# Patient Record
Sex: Male | Born: 1970 | Race: Black or African American | Hispanic: No | Marital: Single | Smoking: Current every day smoker
Health system: Southern US, Community
[De-identification: ages and names within clinical notes are randomized; demographics above are authoritative.]

## PROBLEM LIST (undated history)

## (undated) DIAGNOSIS — F1290 Cannabis use, unspecified, uncomplicated: Secondary | ICD-10-CM

## (undated) DIAGNOSIS — F1910 Other psychoactive substance abuse, uncomplicated: Secondary | ICD-10-CM

## (undated) DIAGNOSIS — N179 Acute kidney failure, unspecified: Secondary | ICD-10-CM

## (undated) DIAGNOSIS — Z5900 Homelessness unspecified: Secondary | ICD-10-CM

## (undated) DIAGNOSIS — R110 Nausea: Secondary | ICD-10-CM

## (undated) DIAGNOSIS — K219 Gastro-esophageal reflux disease without esophagitis: Secondary | ICD-10-CM

## (undated) HISTORY — DX: Nausea: R11.0

## (undated) HISTORY — PX: NO PAST SURGERIES: SHX2092

## (undated) HISTORY — DX: Homelessness unspecified: Z59.00

## (undated) HISTORY — DX: Homelessness: Z59.0

## (undated) HISTORY — DX: Other psychoactive substance abuse, uncomplicated: F19.10

---

## 2003-08-06 ENCOUNTER — Emergency Department (HOSPITAL_COMMUNITY): Admission: EM | Admit: 2003-08-06 | Discharge: 2003-08-06 | Payer: Self-pay | Admitting: Emergency Medicine

## 2004-02-19 ENCOUNTER — Emergency Department (HOSPITAL_COMMUNITY): Admission: AD | Admit: 2004-02-19 | Discharge: 2004-02-19 | Payer: Self-pay | Admitting: Family Medicine

## 2004-10-03 ENCOUNTER — Emergency Department (HOSPITAL_COMMUNITY): Admission: EM | Admit: 2004-10-03 | Discharge: 2004-10-03 | Payer: Self-pay | Admitting: Family Medicine

## 2005-05-27 ENCOUNTER — Emergency Department (HOSPITAL_COMMUNITY): Admission: EM | Admit: 2005-05-27 | Discharge: 2005-05-27 | Payer: Self-pay | Admitting: Family Medicine

## 2006-06-02 ENCOUNTER — Emergency Department (HOSPITAL_COMMUNITY): Admission: EM | Admit: 2006-06-02 | Discharge: 2006-06-03 | Payer: Self-pay | Admitting: Emergency Medicine

## 2009-02-20 ENCOUNTER — Emergency Department (HOSPITAL_COMMUNITY): Admission: EM | Admit: 2009-02-20 | Discharge: 2009-02-20 | Payer: Self-pay | Admitting: Emergency Medicine

## 2010-08-11 ENCOUNTER — Emergency Department (HOSPITAL_COMMUNITY): Admission: EM | Admit: 2010-08-11 | Discharge: 2010-08-11 | Payer: Self-pay | Admitting: Emergency Medicine

## 2011-02-26 LAB — DIFFERENTIAL
Lymphocytes Relative: 9 % — ABNORMAL LOW (ref 12–46)
Lymphs Abs: 0.8 10*3/uL (ref 0.7–4.0)
Neutro Abs: 6.8 10*3/uL (ref 1.7–7.7)
Neutrophils Relative %: 85 % — ABNORMAL HIGH (ref 43–77)

## 2011-02-26 LAB — COMPREHENSIVE METABOLIC PANEL
CO2: 31 mEq/L (ref 19–32)
Calcium: 9.3 mg/dL (ref 8.4–10.5)
Creatinine, Ser: 1.37 mg/dL (ref 0.4–1.5)
GFR calc non Af Amer: 58 mL/min — ABNORMAL LOW (ref >60)
Glucose, Bld: 123 mg/dL — ABNORMAL HIGH (ref 70–99)

## 2011-02-26 LAB — URINALYSIS, ROUTINE W REFLEX MICROSCOPIC
Bilirubin Urine: NEGATIVE
Hgb urine dipstick: NEGATIVE
Nitrite: NEGATIVE
Protein, ur: 30 mg/dL — AB
Urobilinogen, UA: 2 mg/dL — ABNORMAL HIGH (ref 0.0–1.0)

## 2011-02-26 LAB — URINE MICROSCOPIC-ADD ON

## 2011-02-26 LAB — CBC
Hemoglobin: 16.9 g/dL (ref 13.0–17.0)
MCHC: 33.9 g/dL (ref 30.0–36.0)
MCV: 88.9 fL (ref 78.0–100.0)
RBC: 5.6 MIL/uL (ref 4.22–5.81)

## 2011-02-26 LAB — LIPASE, BLOOD: Lipase: 25 U/L (ref 11–59)

## 2011-03-04 ENCOUNTER — Emergency Department (HOSPITAL_COMMUNITY)
Admission: EM | Admit: 2011-03-04 | Discharge: 2011-03-04 | Disposition: A | Discharge status: Home / Self Care | Payer: Self-pay | Attending: Emergency Medicine | Admitting: Emergency Medicine

## 2011-03-04 DIAGNOSIS — R112 Nausea with vomiting, unspecified: Secondary | ICD-10-CM | POA: Insufficient documentation

## 2011-03-04 DIAGNOSIS — I498 Other specified cardiac arrhythmias: Secondary | ICD-10-CM | POA: Insufficient documentation

## 2011-03-04 LAB — POCT I-STAT, CHEM 8
BUN: 27 mg/dL — ABNORMAL HIGH (ref 6–23)
Chloride: 101 mEq/L (ref 96–112)
Sodium: 142 mEq/L (ref 135–145)
TCO2: 31 mmol/L (ref 0–100)

## 2011-03-07 ENCOUNTER — Emergency Department (HOSPITAL_COMMUNITY)
Admission: EM | Admit: 2011-03-07 | Discharge: 2011-03-07 | Disposition: A | Discharge status: Home / Self Care | Payer: Self-pay | Attending: Emergency Medicine | Admitting: Emergency Medicine

## 2011-03-07 DIAGNOSIS — R112 Nausea with vomiting, unspecified: Secondary | ICD-10-CM | POA: Insufficient documentation

## 2011-03-07 LAB — POCT I-STAT, CHEM 8
Calcium, Ion: 1.15 mmol/L (ref 1.12–1.32)
Chloride: 99 mEq/L (ref 96–112)
HCT: 50 % (ref 39.0–52.0)
Potassium: 3.5 mEq/L (ref 3.5–5.1)

## 2011-03-07 LAB — GLUCOSE, CAPILLARY: Glucose-Capillary: 108 mg/dL — ABNORMAL HIGH (ref 70–99)

## 2011-11-25 ENCOUNTER — Encounter: Payer: Self-pay | Admitting: *Deleted

## 2011-11-25 ENCOUNTER — Emergency Department (HOSPITAL_COMMUNITY)
Admission: EM | Admit: 2011-11-25 | Discharge: 2011-11-25 | Disposition: A | Discharge status: Home / Self Care | Payer: Self-pay | Attending: Emergency Medicine | Admitting: Emergency Medicine

## 2011-11-25 DIAGNOSIS — R112 Nausea with vomiting, unspecified: Secondary | ICD-10-CM | POA: Insufficient documentation

## 2011-11-25 LAB — COMPREHENSIVE METABOLIC PANEL
ALT: 20 U/L (ref 0–53)
AST: 14 U/L (ref 0–37)
Alkaline Phosphatase: 60 U/L (ref 39–117)
CO2: 31 mEq/L (ref 19–32)
Chloride: 92 mEq/L — ABNORMAL LOW (ref 96–112)
GFR calc non Af Amer: 58 mL/min — ABNORMAL LOW (ref >90)
Potassium: 3.4 mEq/L — ABNORMAL LOW (ref 3.5–5.1)
Sodium: 138 mEq/L (ref 135–145)
Total Bilirubin: 0.7 mg/dL (ref 0.3–1.2)

## 2011-11-25 LAB — DIFFERENTIAL
Basophils Absolute: 0 10*3/uL (ref 0.0–0.1)
Lymphocytes Relative: 25 % (ref 12–46)
Monocytes Absolute: 0.5 10*3/uL (ref 0.1–1.0)
Neutro Abs: 3.8 10*3/uL (ref 1.7–7.7)
Neutrophils Relative %: 66 % (ref 43–77)

## 2011-11-25 LAB — CBC
HCT: 49.5 % (ref 39.0–52.0)
Platelets: 190 10*3/uL (ref 150–400)
RDW: 13 % (ref 11.5–15.5)
WBC: 5.8 10*3/uL (ref 4.0–10.5)

## 2011-11-25 MED ORDER — SODIUM CHLORIDE 0.9 % IV BOLUS (SEPSIS)
1000.0000 mL | Freq: Once | INTRAVENOUS | Status: AC
  Administered 2011-11-25: 1000 mL via INTRAVENOUS

## 2011-11-25 MED ORDER — ONDANSETRON HCL 4 MG/2ML IJ SOLN
4.0000 mg | Freq: Once | INTRAMUSCULAR | Status: AC
  Administered 2011-11-25: 4 mg via INTRAVENOUS
  Filled 2011-11-25: qty 2

## 2011-11-25 MED ORDER — PROMETHAZINE HCL 25 MG PO TABS: 25.0000 mg | ORAL_TABLET | Freq: Four times a day (QID) | ORAL | Status: DC | PRN

## 2011-11-25 MED ORDER — PANTOPRAZOLE SODIUM 40 MG IV SOLR
40.0000 mg | Freq: Once | INTRAVENOUS | Status: AC
  Administered 2011-11-25: 40 mg via INTRAVENOUS
  Filled 2011-11-25: qty 40

## 2011-11-25 NOTE — ED Provider Notes (Signed)
History     CSN: [id number]  Arrival date & time 11/25/11  1506   First MD Initiated Contact with Patient 11/25/11 2135      Chief Complaint  Patient presents with  . Emesis    pt reports vomiting with PO intake, x's 7days reports constant nausea since new years day. pt reports unable to sleep and feeling weak.      HPI  History provided by the patient. Patient is a 41 year old male with no significant past medical history who presents with complaints of nausea and vomiting for the past 7 days. Patient reports symptoms began after the New Year's day. He admits to heavy drinking at that time. He denies any abdominal pains but reports having persistent nausea and vomiting with poor intake. He reports not being a keep down any solid foods and only minimal fluids. He denies any fever, chills, sweats, diarrhea. He denies any known sick contacts.   Patient has no other significant past medical history.     History reviewed. No pertinent past medical history.  History reviewed. No pertinent past surgical history.  History reviewed. No pertinent family history.  History  Substance Use Topics  . Smoking status: Current Everyday Smoker    Types: Cigarettes  . Smokeless tobacco: Not on file  . Alcohol Use: Yes      Review of Systems  Constitutional: Negative for fever and chills.  Respiratory: Negative for cough.   Gastrointestinal: Positive for nausea and vomiting. Negative for abdominal pain, diarrhea and constipation.  Genitourinary: Negative for dysuria, frequency and hematuria.  All other systems reviewed and are negative.    Allergies  Review of patient's allergies indicates no known allergies.  Home Medications  No current outpatient prescriptions on file.  BP 128/91  Pulse 61  Temp(Src) 98.2 F (36.8 C) (Oral)  Resp 20  SpO2 98%  Physical Exam  Nursing note and vitals reviewed. Constitutional: He is oriented to person, place, and time. He appears  well-developed and well-nourished. No distress.  HENT:  Head: Normocephalic and atraumatic.  Mouth/Throat: Oropharynx is clear and moist.  Cardiovascular: Normal rate and regular rhythm.   No murmur heard. Pulmonary/Chest: Effort normal and breath sounds normal.  Abdominal: Soft. He exhibits no distension. There is no tenderness. There is no rebound and no guarding.  Musculoskeletal: He exhibits no edema and no tenderness.  Neurological: He is alert and oriented to person, place, and time.  Skin: Skin is warm and dry. No erythema.  Psychiatric: He has a normal mood and affect. His behavior is normal.    ED Course  Procedures (including critical care time)   Labs Reviewed  CBC  DIFFERENTIAL  COMPREHENSIVE METABOLIC PANEL  LIPASE, BLOOD   Results for orders placed during the hospital encounter of 11/25/11  CBC      Component Value Range   WBC 5.8  4.0 - 10.5 (K/uL)   RBC 6.08 (*) 4.22 - 5.81 (MIL/uL)   Hemoglobin 18.0 (*) 13.0 - 17.0 (g/dL)   HCT 40.9  81.1 - 91.4 (%)   MCV 81.4  78.0 - 100.0 (fL)   MCH 29.6  26.0 - 34.0 (pg)   MCHC 36.4 (*) 30.0 - 36.0 (g/dL)   RDW 78.2  95.6 - 21.3 (%)   Platelets 190  150 - 400 (K/uL)  DIFFERENTIAL      Component Value Range   Neutrophils Relative 66  43 - 77 (%)   Neutro Abs 3.8  1.7 - 7.7 (K/uL)  Lymphocytes Relative 25  12 - 46 (%)   Lymphs Abs 1.4  0.7 - 4.0 (K/uL)   Monocytes Relative 9  3 - 12 (%)   Monocytes Absolute 0.5  0.1 - 1.0 (K/uL)   Eosinophils Relative 0  0 - 5 (%)   Eosinophils Absolute 0.0  0.0 - 0.7 (K/uL)   Basophils Relative 0  0 - 1 (%)   Basophils Absolute 0.0  0.0 - 0.1 (K/uL)  COMPREHENSIVE METABOLIC PANEL      Component Value Range   Sodium 138  135 - 145 (mEq/L)   Potassium 3.4 (*) 3.5 - 5.1 (mEq/L)   Chloride 92 (*) 96 - 112 (mEq/L)   CO2 31  19 - 32 (mEq/L)   Glucose, Bld 127 (*) 70 - 99 (mg/dL)   BUN 33 (*) 6 - 23 (mg/dL)   Creatinine, Ser 1.61 (*) 0.50 - 1.35 (mg/dL)   Calcium 9.9  8.4 - 09.6  (mg/dL)   Total Protein 8.3  6.0 - 8.3 (g/dL)   Albumin 4.8  3.5 - 5.2 (g/dL)   AST 14  0 - 37 (U/L)   ALT 20  0 - 53 (U/L)   Alkaline Phosphatase 60  39 - 117 (U/L)   Total Bilirubin 0.7  0.3 - 1.2 (mg/dL)   GFR calc non Af Amer 58 (*) >90 (mL/min)   GFR calc Af Amer 67 (*) >90 (mL/min)  LIPASE, BLOOD      Component Value Range   Lipase 50  11 - 59 (U/L)      No results found.   1. Nausea & vomiting       MDM  9:50 PM patient seen and evaluated. Patient in no acute distress.  Patient reports feeling much better after IV fluids and nausea medications. He is tolerating by mouth fluids. Labs unremarkable aside from some signs of dehydration. At this time we'll discharge patient home.      Angus Seller, [place] 11/25/11 [phone]

## 2011-11-25 NOTE — ED Notes (Signed)
Bed:WA19<BR> Expected date:<BR> Expected time:<BR> Means of arrival:<BR> Comments:<BR>

## 2011-11-25 NOTE — ED Notes (Signed)
Per EMS has had 7 days of nausea but with vomitting or diarrhea. Pt has had this for several years and has not been dx with anything. Vital Normal 128/88 pulse 76 and CBG 139.

## 2011-11-26 NOTE — ED Provider Notes (Signed)
Medical screening examination/treatment/procedure(s) were performed by non-physician practitioner and as supervising physician I was immediately available for consultation/collaboration.  Day Deery, MD 11/26/11 1245 

## 2012-02-13 ENCOUNTER — Encounter (HOSPITAL_COMMUNITY): Payer: Self-pay | Admitting: *Deleted

## 2012-02-13 ENCOUNTER — Emergency Department (HOSPITAL_COMMUNITY)
Admission: EM | Admit: 2012-02-13 | Discharge: 2012-02-13 | Disposition: A | Discharge status: Home / Self Care | Payer: Self-pay | Attending: Emergency Medicine | Admitting: Emergency Medicine

## 2012-02-13 DIAGNOSIS — R112 Nausea with vomiting, unspecified: Secondary | ICD-10-CM | POA: Insufficient documentation

## 2012-02-13 DIAGNOSIS — R5381 Other malaise: Secondary | ICD-10-CM | POA: Insufficient documentation

## 2012-02-13 DIAGNOSIS — R42 Dizziness and giddiness: Secondary | ICD-10-CM | POA: Insufficient documentation

## 2012-02-13 DIAGNOSIS — R5383 Other fatigue: Secondary | ICD-10-CM | POA: Insufficient documentation

## 2012-02-13 DIAGNOSIS — F17200 Nicotine dependence, unspecified, uncomplicated: Secondary | ICD-10-CM | POA: Insufficient documentation

## 2012-02-13 LAB — DIFFERENTIAL
Basophils Absolute: 0 10*3/uL (ref 0.0–0.1)
Eosinophils Absolute: 0 10*3/uL (ref 0.0–0.7)
Eosinophils Relative: 0 % (ref 0–5)
Lymphocytes Relative: 13 % (ref 12–46)
Neutrophils Relative %: 73 % (ref 43–77)

## 2012-02-13 LAB — CBC
MCV: 82.6 fL (ref 78.0–100.0)
Platelets: 200 10*3/uL (ref 150–400)
RDW: 13.6 % (ref 11.5–15.5)
WBC: 6.3 10*3/uL (ref 4.0–10.5)

## 2012-02-13 LAB — BASIC METABOLIC PANEL
Calcium: 9.6 mg/dL (ref 8.4–10.5)
GFR calc non Af Amer: >90 mL/min (ref >90)
Potassium: 3.7 mEq/L (ref 3.5–5.1)
Sodium: 139 mEq/L (ref 135–145)

## 2012-02-13 MED ORDER — METOCLOPRAMIDE HCL 5 MG/ML IJ SOLN
10.0000 mg | Freq: Once | INTRAMUSCULAR | Status: AC
  Administered 2012-02-13: 10 mg via INTRAVENOUS
  Filled 2012-02-13: qty 2

## 2012-02-13 MED ORDER — SODIUM CHLORIDE 0.9 % IV BOLUS (SEPSIS)
1000.0000 mL | Freq: Once | INTRAVENOUS | Status: AC
  Administered 2012-02-13: 1000 mL via INTRAVENOUS

## 2012-02-13 MED ORDER — ONDANSETRON HCL 4 MG/2ML IJ SOLN
4.0000 mg | Freq: Once | INTRAMUSCULAR | Status: AC
  Administered 2012-02-13: 4 mg via INTRAVENOUS
  Filled 2012-02-13: qty 2

## 2012-02-13 MED ORDER — PROMETHAZINE HCL 25 MG PO TABS: 25.0000 mg | ORAL_TABLET | Freq: Four times a day (QID) | ORAL | Status: DC | PRN

## 2012-02-13 NOTE — ED Provider Notes (Signed)
History     CSN: [id number]  Arrival date & time 02/13/12  7829   First MD Initiated Contact with Patient 02/13/12 1047      Chief Complaint  Patient presents with  . Fatigue    (Consider location/radiation/quality/duration/timing/severity/associated sxs/prior treatment) HPI  Patient presents to emergency department complaining of a one to two-week history of gradual onset nausea with vomiting and generalized fatigue. Patient states he also feels mildly dizzy. Patient states that over last 1-2 years he's had multiple episodes of similar symptoms. Patient denies any associated abdominal pain with nausea but states "every time I eat I vomit." Patient states he is able to hold down small amounts of fluid and Gatorade without vomiting but is unable to hold down food. Patient states that "no one has ever really figure out why this happens to me." Patient denies any fevers, chills, chest pain, shortness breath, abdominal pain, diarrhea, dysuria, hematuria, or blood in his stool. Patient states he has no known medical problems and takes no medicine on regular basis. Patient is taking no medication prior to arrival the symptoms. Patient denies aggravating or alleviating factors. Symptoms were gradual onset, persistent, and unchanging.  History reviewed. No pertinent past medical history.  History reviewed. No pertinent past surgical history.  History reviewed. No pertinent family history.  History  Substance Use Topics  . Smoking status: Current Everyday Smoker    Types: Cigarettes  . Smokeless tobacco: Not on file  . Alcohol Use: Yes     occ      Review of Systems  All other systems reviewed and are negative.    Allergies  Review of patient's allergies indicates no known allergies.  Home Medications  No current outpatient prescriptions on file.  BP 105/50  Pulse 73  Temp(Src) 98 F (36.7 C) (Oral)  Resp 20  SpO2 98%  Physical Exam  Nursing note and vitals  reviewed. Constitutional: He is oriented to person, place, and time. He appears well-developed and well-nourished. No distress.  HENT:  Head: Normocephalic and atraumatic.       Mucus membranes pink and moist  Eyes: Conjunctivae are normal.  Neck: Normal range of motion. Neck supple.  Cardiovascular: Normal rate, regular rhythm, normal heart sounds and intact distal pulses.  Exam reveals no gallop and no friction rub.   No murmur heard. Pulmonary/Chest: Effort normal and breath sounds normal. No respiratory distress. He has no wheezes. He has no rales. He exhibits no tenderness.  Abdominal: Soft. Bowel sounds are normal. He exhibits no distension and no mass. There is no tenderness. There is no rebound and no guarding.  Musculoskeletal: Normal range of motion. He exhibits no edema and no tenderness.  Neurological: He is alert and oriented to person, place, and time. No cranial nerve deficit. Coordination normal.  Skin: Skin is warm and dry. No rash noted. He is not diaphoretic. No erythema.  Psychiatric: He has a normal mood and affect.    ED Course  Procedures (including critical care time)  IV fluids and zofran  12:25 PM Nausea resolved after medication and fluids. Will PO challenge  Labs Reviewed  CBC - Abnormal; Notable for the following:    Hemoglobin 17.3 (*)    MCHC 36.1 (*)    All other components within normal limits  DIFFERENTIAL - Abnormal; Notable for the following:    Monocytes Relative 14 (*)    All other components within normal limits  BASIC METABOLIC PANEL - Abnormal; Notable for the following:  Glucose, Bld 111 (*)    BUN 24 (*)    All other components within normal limits   No results found.   1. Nausea and vomiting       MDM  Patient has no known medical problems with recurrent episodes of nausea and vomiting that are not associated with abdominal pain. VSS and afebrile. Nonacute abdomen. No acute findings on his labs. Patient not tolerating fluids  well. No signs or symptoms of dehydration although 1 L of IV fluids given in ER. Secondly for the patient about the need for establishment with primary care provider for further evaluation and management of recurrent nausea and vomiting. He voices understanding and is agreeable to plan. I gave him precautions and reasons to return to ER for changing or worsening symptoms. Patient voices understanding.        Jenness Corner, [place] 02/13/12 1255

## 2012-02-13 NOTE — ED Notes (Signed)
Pt reports for a week he hasn't been able to eat anything, states every time he eats it comes back up. Pt noted to be drinking gatorade in triage. Pt reports dizziness and fatigue. Denies abd pain.

## 2012-02-13 NOTE — ED Notes (Signed)
Pt. States that he hasn't been eating for about a week.  The smell of food makes him nauseous and causes him to throw up.  He can occasionally keep water down but everything else comes back up.  He is feeling weak and has no energy.  Feels dizzy but feels that is d/t him not eating.

## 2012-02-13 NOTE — Discharge Instructions (Signed)
Take phenergan as needed for nausea and vomiting. Establishment with a Primary Care provider is Very important for general health care concerns, minor illness and minor injury and for further evaluation and management of recurrent nausea and vomiting but return to ER for changing or worsening of symptoms.   Nausea and Vomiting Nausea is a sick feeling that often comes before throwing up (vomiting). Vomiting is a reflex where stomach contents come out of your mouth. Vomiting can cause severe loss of body fluids (dehydration). Children and elderly adults can become dehydrated quickly, especially if they also have diarrhea. Nausea and vomiting are symptoms of a condition or disease. It is important to find the cause of your symptoms. CAUSES   Direct irritation of the stomach lining. This irritation can result from increased acid production (gastroesophageal reflux disease), infection, food poisoning, taking certain medicines (such as nonsteroidal anti-inflammatory drugs), alcohol use, or tobacco use.   Signals from the brain.These signals could be caused by a headache, heat exposure, an inner ear disturbance, increased pressure in the brain from injury, infection, a tumor, or a concussion, pain, emotional stimulus, or metabolic problems.   An obstruction in the gastrointestinal tract (bowel obstruction).   Illnesses such as diabetes, hepatitis, gallbladder problems, appendicitis, kidney problems, cancer, sepsis, atypical symptoms of a heart attack, or eating disorders.   Medical treatments such as chemotherapy and radiation.   Receiving medicine that makes you sleep (general anesthetic) during surgery.  DIAGNOSIS Your caregiver may ask for tests to be done if the problems do not improve after a few days. Tests may also be done if symptoms are severe or if the reason for the nausea and vomiting is not clear. Tests may include:  Urine tests.   Blood tests.   Stool tests.   Cultures (to look  for evidence of infection).   X-rays or other imaging studies.  Test results can help your caregiver make decisions about treatment or the need for additional tests. TREATMENT You need to stay well hydrated. Drink frequently but in small amounts.You may wish to drink water, sports drinks, clear broth, or eat frozen ice pops or gelatin dessert to help stay hydrated.When you eat, eating slowly may help prevent nausea.There are also some antinausea medicines that may help prevent nausea. HOME CARE INSTRUCTIONS   Take all medicine as directed by your caregiver.   If you do not have an appetite, do not force yourself to eat. However, you must continue to drink fluids.   If you have an appetite, eat a normal diet unless your caregiver tells you differently.   Eat a variety of complex carbohydrates (rice, wheat, potatoes, bread), lean meats, yogurt, fruits, and vegetables.   Avoid high-fat foods because they are more difficult to digest.   Drink enough water and fluids to keep your urine clear or pale yellow.   If you are dehydrated, ask your caregiver for specific rehydration instructions. Signs of dehydration may include:   Severe thirst.   Dry lips and mouth.   Dizziness.   Dark urine.   Decreasing urine frequency and amount.   Confusion.   Rapid breathing or pulse.  SEEK IMMEDIATE MEDICAL CARE IF:   You have blood or brown flecks (like coffee grounds) in your vomit.   You have black or bloody stools.   You have a severe headache or stiff neck.   You are confused.   You have severe abdominal pain.   You have chest pain or trouble breathing.   You  do not urinate at least once every 8 hours.   You develop cold or clammy skin.   You continue to vomit for longer than 24 to 48 hours.   You have a fever.  MAKE SURE YOU:   Understand these instructions.   Will watch your condition.   Will get help right away if you are not doing well or get worse.  Document  Released: 11/03/2005 Document Revised: 10/23/2011 Document Reviewed: 04/02/2011 [facility] Patient Information 2012 [place], [place].

## 2012-02-14 NOTE — ED Provider Notes (Signed)
Medical screening examination/treatment/procedure(s) were performed by non-physician practitioner and as supervising physician I was immediately available for consultation/collaboration.   Forbes Cellar, MD 02/14/12 [phone]

## 2013-02-28 ENCOUNTER — Emergency Department (HOSPITAL_COMMUNITY): Payer: Self-pay

## 2013-02-28 ENCOUNTER — Emergency Department (HOSPITAL_COMMUNITY)
Admission: EM | Admit: 2013-02-28 | Discharge: 2013-02-28 | Disposition: A | Discharge status: Home / Self Care | Payer: Self-pay | Attending: Emergency Medicine | Admitting: Emergency Medicine

## 2013-02-28 ENCOUNTER — Encounter (HOSPITAL_COMMUNITY): Payer: Self-pay | Admitting: *Deleted

## 2013-02-28 DIAGNOSIS — R1110 Vomiting, unspecified: Secondary | ICD-10-CM

## 2013-02-28 DIAGNOSIS — F17200 Nicotine dependence, unspecified, uncomplicated: Secondary | ICD-10-CM | POA: Insufficient documentation

## 2013-02-28 DIAGNOSIS — R112 Nausea with vomiting, unspecified: Secondary | ICD-10-CM | POA: Insufficient documentation

## 2013-02-28 LAB — COMPREHENSIVE METABOLIC PANEL
ALT: 13 U/L (ref 0–53)
Alkaline Phosphatase: 50 U/L (ref 39–117)
CO2: 26 mEq/L (ref 19–32)
Chloride: 101 mEq/L (ref 96–112)
GFR calc Af Amer: >90 mL/min (ref >90)
GFR calc non Af Amer: 81 mL/min — ABNORMAL LOW (ref >90)
Glucose, Bld: 103 mg/dL — ABNORMAL HIGH (ref 70–99)
Potassium: 3.7 mEq/L (ref 3.5–5.1)
Sodium: 137 mEq/L (ref 135–145)
Total Bilirubin: 0.5 mg/dL (ref 0.3–1.2)
Total Protein: 6.9 g/dL (ref 6.0–8.3)

## 2013-02-28 LAB — CBC WITH DIFFERENTIAL/PLATELET
Hemoglobin: 16.3 g/dL (ref 13.0–17.0)
Lymphocytes Relative: 36 % (ref 12–46)
Lymphs Abs: 1.7 10*3/uL (ref 0.7–4.0)
Neutro Abs: 2.6 10*3/uL (ref 1.7–7.7)
Neutrophils Relative %: 55 % (ref 43–77)
Platelets: 167 10*3/uL (ref 150–400)
RBC: 5.55 MIL/uL (ref 4.22–5.81)
WBC: 4.8 10*3/uL (ref 4.0–10.5)

## 2013-02-28 MED ORDER — PROMETHAZINE HCL 25 MG PO TABS: 25.0000 mg | ORAL_TABLET | Freq: Four times a day (QID) | ORAL | Status: DC | PRN

## 2013-02-28 MED ORDER — PROMETHAZINE HCL 25 MG/ML IJ SOLN
25.0000 mg | Freq: Once | INTRAMUSCULAR | Status: AC
  Administered 2013-02-28: 25 mg via INTRAVENOUS
  Filled 2013-02-28: qty 1

## 2013-02-28 MED ORDER — SODIUM CHLORIDE 0.9 % IV BOLUS (SEPSIS)
1000.0000 mL | Freq: Once | INTRAVENOUS | Status: AC
  Administered 2013-02-28: 1000 mL via INTRAVENOUS

## 2013-02-28 MED ORDER — METOCLOPRAMIDE HCL 5 MG/ML IJ SOLN: 10.0000 mg | Freq: Once | INTRAMUSCULAR | Status: DC

## 2013-02-28 MED ORDER — ONDANSETRON HCL 4 MG/2ML IJ SOLN
4.0000 mg | Freq: Once | INTRAMUSCULAR | Status: AC
  Administered 2013-02-28: 4 mg via INTRAVENOUS
  Filled 2013-02-28: qty 2

## 2013-02-28 NOTE — ED Notes (Signed)
C/o n/v x 3 weeks. Denies diarrhea or abd pain

## 2013-02-28 NOTE — ED Notes (Signed)
C/o intermittent n/v x 2 years. Current episode started 3 weeks ago, reports unable to keep anything down by mouth. Denies diarrhea or abd pain. Has been seen in ED for same

## 2013-02-28 NOTE — ED Notes (Signed)
Patient transported to X-ray 

## 2013-02-28 NOTE — ED Provider Notes (Signed)
History     CSN: [id number]  Arrival date & time 02/28/13  0740   First MD Initiated Contact with Patient 02/28/13 0750      Chief Complaint  Patient presents with  . Nausea  . Emesis    (Consider location/radiation/quality/duration/timing/severity/associated sxs/prior treatment) The history is provided by the patient.  Henry Kramer is a 42 y.o. male here with nausea, vomiting. Intermittent nausea and vomiting for the last 3 weeks. He said he also has been dry heaving. Denies abdominal pain or chest pain. He has been here a year ago for similar symptoms and had taken Phenergan with some relief. Denies any fevers or chills. He doesn't have any insurance so doesn't have a pmd. No weight loss.    History reviewed. No pertinent past medical history.  History reviewed. No pertinent past surgical history.  No family history on file.  History  Substance Use Topics  . Smoking status: Current Every Day Smoker    Types: Cigarettes  . Smokeless tobacco: Not on file  . Alcohol Use: Yes     Comment: occ      Review of Systems  Gastrointestinal: Positive for nausea and vomiting.  All other systems reviewed and are negative.    Allergies  Review of patient's allergies indicates no known allergies.  Home Medications   No current outpatient prescriptions on file.  BP 121/75  Pulse 58  Temp(Src) 98 F (36.7 C) (Oral)  Resp 16  Ht 6\' 3"  (1.905 m)  Wt 175 lb (79.379 kg)  BMI 21.87 kg/m2  SpO2 97%  Physical Exam  Nursing note and vitals reviewed. Constitutional: He is oriented to person, place, and time. He appears well-developed and well-nourished.  HENT:  Head: Normocephalic.  MM slightly dry   Eyes: Conjunctivae are normal. Pupils are equal, round, and reactive to light.  Neck: Normal range of motion. Neck supple.  Cardiovascular: Normal rate, regular rhythm and normal heart sounds.   Pulmonary/Chest: Effort normal and breath sounds normal. No respiratory  distress. He has no wheezes. He has no rales.  Abdominal: Soft. Bowel sounds are normal. He exhibits no distension. There is no tenderness. There is no rebound.  Musculoskeletal: Normal range of motion.  Neurological: He is alert and oriented to person, place, and time. No cranial nerve deficit. Coordination normal.  Skin: Skin is warm and dry.  Psychiatric: He has a normal mood and affect. His behavior is normal. Judgment and thought content normal.    ED Course  Procedures (including critical care time)  Labs Reviewed  CBC WITH DIFFERENTIAL - Abnormal; Notable for the following:    MCHC 36.3 (*)    All other components within normal limits  COMPREHENSIVE METABOLIC PANEL - Abnormal; Notable for the following:    Glucose, Bld 103 (*)    GFR calc non Af Amer 81 (*)    All other components within normal limits  LIPASE, BLOOD   Dg Abd Acute W/chest  02/28/2013  *RADIOLOGY REPORT*  Clinical Data: Nausea and vomiting for 3 weeks  ACUTE ABDOMEN SERIES (ABDOMEN 2 VIEW & CHEST 1 VIEW)  Comparison: 02/20/2009  Findings: Normal heart size, mediastinal contours, and pulmonary vascularity. Chronic peribronchial thickening. Lungs clear. No pleural effusion or pneumothorax.  Pelvic phleboliths. Normal bowel gas pattern. No bowel dilatation, bowel wall thickening, or free intraperitoneal air. Bones unremarkable. No urinary tract calcification.  IMPRESSION: Chronic bronchitic changes. No acute abnormalities.   Original Report Authenticated By: Ulyses Southward, M.D.  No diagnosis found.    MDM  Henry Kramer is a 42 y.o. male here with nausea/vomiting. Will get basic blood work. Will give IVF and zofran and reassess.   11:34 AM Felt better after zofran and phenergan and fluids. Will d/c home on phenergan. Gave him a list of pmd to f/u with.         Richardean Canal, MD 02/28/13 [phone]

## 2013-11-21 ENCOUNTER — Encounter (HOSPITAL_COMMUNITY): Payer: Self-pay | Admitting: Emergency Medicine

## 2013-11-21 ENCOUNTER — Emergency Department (HOSPITAL_COMMUNITY)
Admission: EM | Admit: 2013-11-21 | Discharge: 2013-11-21 | Disposition: A | Discharge status: Home / Self Care | Payer: No Typology Code available for payment source | Attending: Emergency Medicine | Admitting: Emergency Medicine

## 2013-11-21 DIAGNOSIS — R42 Dizziness and giddiness: Secondary | ICD-10-CM | POA: Insufficient documentation

## 2013-11-21 DIAGNOSIS — R109 Unspecified abdominal pain: Secondary | ICD-10-CM | POA: Insufficient documentation

## 2013-11-21 DIAGNOSIS — R112 Nausea with vomiting, unspecified: Secondary | ICD-10-CM | POA: Insufficient documentation

## 2013-11-21 DIAGNOSIS — F17200 Nicotine dependence, unspecified, uncomplicated: Secondary | ICD-10-CM | POA: Insufficient documentation

## 2013-11-21 DIAGNOSIS — E860 Dehydration: Secondary | ICD-10-CM | POA: Insufficient documentation

## 2013-11-21 DIAGNOSIS — R1110 Vomiting, unspecified: Secondary | ICD-10-CM

## 2013-11-21 LAB — COMPREHENSIVE METABOLIC PANEL
ALT: 13 U/L (ref 0–53)
AST: 13 U/L (ref 0–37)
Albumin: 4.2 g/dL (ref 3.5–5.2)
Alkaline Phosphatase: 75 U/L (ref 39–117)
BILIRUBIN TOTAL: 1 mg/dL (ref 0.3–1.2)
BUN: 26 mg/dL — ABNORMAL HIGH (ref 6–23)
CALCIUM: 9.2 mg/dL (ref 8.4–10.5)
CHLORIDE: 95 meq/L — AB (ref 96–112)
CO2: 28 meq/L (ref 19–32)
Creatinine, Ser: 1.62 mg/dL — ABNORMAL HIGH (ref 0.50–1.35)
GFR, EST AFRICAN AMERICAN: 59 mL/min — AB (ref >90)
GFR, EST NON AFRICAN AMERICAN: 50 mL/min — AB (ref >90)
GLUCOSE: 126 mg/dL — AB (ref 70–99)
Potassium: 4.1 mEq/L (ref 3.7–5.3)
SODIUM: 138 meq/L (ref 137–147)
Total Protein: 8.5 g/dL — ABNORMAL HIGH (ref 6.0–8.3)

## 2013-11-21 LAB — CBC WITH DIFFERENTIAL/PLATELET
BASOS ABS: 0 10*3/uL (ref 0.0–0.1)
Basophils Relative: 0 % (ref 0–1)
EOS PCT: 0 % (ref 0–5)
Eosinophils Absolute: 0 10*3/uL (ref 0.0–0.7)
HEMATOCRIT: 51 % (ref 39.0–52.0)
Hemoglobin: 18.8 g/dL — ABNORMAL HIGH (ref 13.0–17.0)
LYMPHS ABS: 2.2 10*3/uL (ref 0.7–4.0)
LYMPHS PCT: 20 % (ref 12–46)
MCH: 30.6 pg (ref 26.0–34.0)
MCHC: 36.9 g/dL — ABNORMAL HIGH (ref 30.0–36.0)
MCV: 82.9 fL (ref 78.0–100.0)
MONO ABS: 1.5 10*3/uL — AB (ref 0.1–1.0)
Monocytes Relative: 14 % — ABNORMAL HIGH (ref 3–12)
NEUTROS ABS: 7.2 10*3/uL (ref 1.7–7.7)
Neutrophils Relative %: 66 % (ref 43–77)
PLATELETS: 210 10*3/uL (ref 150–400)
RBC: 6.15 MIL/uL — AB (ref 4.22–5.81)
RDW: 13.4 % (ref 11.5–15.5)
WBC: 10.9 10*3/uL — AB (ref 4.0–10.5)

## 2013-11-21 LAB — URINALYSIS, ROUTINE W REFLEX MICROSCOPIC
BILIRUBIN URINE: NEGATIVE
GLUCOSE, UA: NEGATIVE mg/dL
HGB URINE DIPSTICK: NEGATIVE
KETONES UR: 15 mg/dL — AB
Leukocytes, UA: NEGATIVE
Nitrite: NEGATIVE
PROTEIN: NEGATIVE mg/dL
Specific Gravity, Urine: 1.016 (ref 1.005–1.030)
UROBILINOGEN UA: 1 mg/dL (ref 0.0–1.0)
pH: 5.5 (ref 5.0–8.0)

## 2013-11-21 LAB — LIPASE, BLOOD: Lipase: 29 U/L (ref 11–59)

## 2013-11-21 MED ORDER — ONDANSETRON HCL 4 MG/2ML IJ SOLN
4.0000 mg | Freq: Once | INTRAMUSCULAR | Status: AC
  Administered 2013-11-21: 4 mg via INTRAVENOUS
  Filled 2013-11-21: qty 2

## 2013-11-21 MED ORDER — SODIUM CHLORIDE 0.9 % IV BOLUS (SEPSIS)
1000.0000 mL | Freq: Once | INTRAVENOUS | Status: AC
  Administered 2013-11-21: 1000 mL via INTRAVENOUS

## 2013-11-21 MED ORDER — LANSOPRAZOLE 30 MG PO CPDR: 30.0000 mg | DELAYED_RELEASE_CAPSULE | Freq: Every day | ORAL | Status: DC

## 2013-11-21 MED ORDER — ONDANSETRON 4 MG PO TBDP: ORAL_TABLET | ORAL | Status: DC

## 2013-11-21 NOTE — ED Notes (Signed)
Dr Lita Mains requested to have him sit up and move around and try to eat something.  [place] sandwich given, Sprite.

## 2013-11-21 NOTE — ED Notes (Signed)
Remains in triage waiting area.  Updated on wait for treatment room.

## 2013-11-21 NOTE — Discharge Instructions (Signed)
Make appointment to followup with gastroenterology. Take medications as prescribed. Return immediately for persistent vomiting, blood in vomit or stool, chest pain or for any concerns. Avoid alcohol and NSAID-type (ibuprofen, naproxen, aspirin, etc.) medication. Avoid marijuana use.   Nausea and Vomiting Nausea is a sick feeling that often comes before throwing up (vomiting). Vomiting is a reflex where stomach contents come out of your mouth. Vomiting can cause severe loss of body fluids (dehydration). Children and elderly adults can become dehydrated quickly, especially if they also have diarrhea. Nausea and vomiting are symptoms of a condition or disease. It is important to find the cause of your symptoms. CAUSES   Direct irritation of the stomach lining. This irritation can result from increased acid production (gastroesophageal reflux disease), infection, food poisoning, taking certain medicines (such as nonsteroidal anti-inflammatory drugs), alcohol use, or tobacco use.  Signals from the brain.These signals could be caused by a headache, heat exposure, an inner ear disturbance, increased pressure in the brain from injury, infection, a tumor, or a concussion, pain, emotional stimulus, or metabolic problems.  An obstruction in the gastrointestinal tract (bowel obstruction).  Illnesses such as diabetes, hepatitis, gallbladder problems, appendicitis, kidney problems, cancer, sepsis, atypical symptoms of a heart attack, or eating disorders.  Medical treatments such as chemotherapy and radiation.  Receiving medicine that makes you sleep (general anesthetic) during surgery. DIAGNOSIS Your caregiver may ask for tests to be done if the problems do not improve after a few days. Tests may also be done if symptoms are severe or if the reason for the nausea and vomiting is not clear. Tests may include:  Urine tests.  Blood tests.  Stool tests.  Cultures (to look for evidence of  infection).  X-rays or other imaging studies. Test results can help your caregiver make decisions about treatment or the need for additional tests. TREATMENT You need to stay well hydrated. Drink frequently but in small amounts.You may wish to drink water, sports drinks, clear broth, or eat frozen ice pops or gelatin dessert to help stay hydrated.When you eat, eating slowly may help prevent nausea.There are also some antinausea medicines that may help prevent nausea. HOME CARE INSTRUCTIONS   Take all medicine as directed by your caregiver.  If you do not have an appetite, do not force yourself to eat. However, you must continue to drink fluids.  If you have an appetite, eat a normal diet unless your caregiver tells you differently.  Eat a variety of complex carbohydrates (rice, wheat, potatoes, bread), lean meats, yogurt, fruits, and vegetables.  Avoid high-fat foods because they are more difficult to digest.  Drink enough water and fluids to keep your urine clear or pale yellow.  If you are dehydrated, ask your caregiver for specific rehydration instructions. Signs of dehydration may include:  Severe thirst.  Dry lips and mouth.  Dizziness.  Dark urine.  Decreasing urine frequency and amount.  Confusion.  Rapid breathing or pulse. SEEK IMMEDIATE MEDICAL CARE IF:   You have blood or brown flecks (like coffee grounds) in your vomit.  You have black or bloody stools.  You have a severe headache or stiff neck.  You are confused.  You have severe abdominal pain.  You have chest pain or trouble breathing.  You do not urinate at least once every 8 hours.  You develop cold or clammy skin.  You continue to vomit for longer than 24 to 48 hours.  You have a fever. MAKE SURE YOU:   Understand these instructions.  Will watch your condition.  Will get help right away if you are not doing well or get worse. Document Released: 11/03/2005 Document Revised: 01/26/2012  Document Reviewed: 04/02/2011 [facility] Patient Information 2014 [place], [place].  Dehydration, Adult Dehydration is when you lose more fluids from the body than you take in. Vital organs like the kidneys, brain, and heart cannot function without a proper amount of fluids and salt. Any loss of fluids from the body can cause dehydration.  CAUSES   Vomiting.  Diarrhea.  Excessive sweating.  Excessive urine output.  Fever. SYMPTOMS  Mild dehydration  Thirst.  Dry lips.  Slightly dry mouth. Moderate dehydration  Very dry mouth.  Sunken eyes.  Skin does not bounce back quickly when lightly pinched and released.  Dark urine and decreased urine production.  Decreased tear production.  Headache. Severe dehydration  Very dry mouth.  Extreme thirst.  Rapid, weak pulse (more than 100 beats per minute at rest).  Cold hands and feet.  Not able to sweat in spite of heat and temperature.  Rapid breathing.  Blue lips.  Confusion and lethargy.  Difficulty being awakened.  Minimal urine production.  No tears. DIAGNOSIS  Your caregiver will diagnose dehydration based on your symptoms and your exam. Blood and urine tests will help confirm the diagnosis. The diagnostic evaluation should also identify the cause of dehydration. TREATMENT  Treatment of mild or moderate dehydration can often be done at home by increasing the amount of fluids that you drink. It is best to drink small amounts of fluid more often. Drinking too much at one time can make vomiting worse. Refer to the home care instructions below. Severe dehydration needs to be treated at the hospital where you will probably be given intravenous (IV) fluids that contain water and electrolytes. HOME CARE INSTRUCTIONS   Ask your caregiver about specific rehydration instructions.  Drink enough fluids to keep your urine clear or pale yellow.  Drink small amounts frequently if you have nausea and vomiting.  Eat as  you normally do.  Avoid:  Foods or drinks high in sugar.  Carbonated drinks.  Juice.  Extremely hot or cold fluids.  Drinks with caffeine.  Fatty, greasy foods.  Alcohol.  Tobacco.  Overeating.  Gelatin desserts.  Wash your hands well to avoid spreading bacteria and viruses.  Only take over-the-counter or prescription medicines for pain, discomfort, or fever as directed by your caregiver.  Ask your caregiver if you should continue all prescribed and over-the-counter medicines.  Keep all follow-up appointments with your caregiver. SEEK MEDICAL CARE IF:  You have abdominal pain and it increases or stays in one area (localizes).  You have a rash, stiff neck, or severe headache.  You are irritable, sleepy, or difficult to awaken.  You are weak, dizzy, or extremely thirsty. SEEK IMMEDIATE MEDICAL CARE IF:   You are unable to keep fluids down or you get worse despite treatment.  You have frequent episodes of vomiting or diarrhea.  You have blood or green matter (bile) in your vomit.  You have blood in your stool or your stool looks black and tarry.  You have not urinated in 6 to 8 hours, or you have only urinated a small amount of very dark urine.  You have a fever.  You faint. MAKE SURE YOU:   Understand these instructions.  Will watch your condition.  Will get help right away if you are not doing well or get worse. Document Released: 11/03/2005 Document Revised: 01/26/2012 Document  Reviewed: 06/23/2011 [employer] Patient Information 2014 [place], [place]. Gastritis, Adult Gastritis is soreness and swelling (inflammation) of the lining of the stomach. Gastritis can develop as a sudden onset (acute) or long-term (chronic) condition. If gastritis is not treated, it can lead to stomach bleeding and ulcers. CAUSES  Gastritis occurs when the stomach lining is weak or damaged. Digestive juices from the stomach then inflame the weakened stomach lining. The stomach  lining may be weak or damaged due to viral or bacterial infections. One common bacterial infection is the Helicobacter pylori infection. Gastritis can also result from excessive alcohol consumption, taking certain medicines, or having too much acid in the stomach.  SYMPTOMS  In some cases, there are no symptoms. When symptoms are present, they may include:  Pain or a burning sensation in the upper abdomen.  Nausea.  Vomiting.  An uncomfortable feeling of fullness after eating. DIAGNOSIS  Your caregiver may suspect you have gastritis based on your symptoms and a physical exam. To determine the cause of your gastritis, your caregiver may perform the following:  Blood or stool tests to check for the H pylori bacterium.  Gastroscopy. A thin, flexible tube (endoscope) is passed down the esophagus and into the stomach. The endoscope has a light and camera on the end. Your caregiver uses the endoscope to view the inside of the stomach.  Taking a tissue sample (biopsy) from the stomach to examine under a microscope. TREATMENT  Depending on the cause of your gastritis, medicines may be prescribed. If you have a bacterial infection, such as an H pylori infection, antibiotics may be given. If your gastritis is caused by too much acid in the stomach, H2 blockers or antacids may be given. Your caregiver may recommend that you stop taking aspirin, ibuprofen, or other nonsteroidal anti-inflammatory drugs (NSAIDs). HOME CARE INSTRUCTIONS  Only take over-the-counter or prescription medicines as directed by your caregiver.  If you were given antibiotic medicines, take them as directed. Finish them even if you start to feel better.  Drink enough fluids to keep your urine clear or pale yellow.  Avoid foods and drinks that make your symptoms worse, such as:  Caffeine or alcoholic drinks.  Chocolate.  Peppermint or mint flavorings.  Garlic and onions.  Spicy foods.  Citrus fruits, such as oranges,  lemons, or limes.  Tomato-based foods such as sauce, chili, salsa, and pizza.  Fried and fatty foods.  Eat small, frequent meals instead of large meals. SEEK IMMEDIATE MEDICAL CARE IF:   You have black or dark red stools.  You vomit blood or material that looks like coffee grounds.  You are unable to keep fluids down.  Your abdominal pain gets worse.  You have a fever.  You do not feel better after 1 week.  You have any other questions or concerns. MAKE SURE YOU:  Understand these instructions.  Will watch your condition.  Will get help right away if you are not doing well or get worse. Document Released: 10/28/2001 Document Revised: 05/04/2012 Document Reviewed: 12/17/2011 [facility] Patient Information 2014 [place].

## 2013-11-21 NOTE — ED Notes (Signed)
Patient stated that he has not been able to eat for about 1 week.  Has been able to drink but not that much.  States he gets dizzy sometimes when he gets up.

## 2013-11-21 NOTE — ED Notes (Signed)
Pt had no problems with eating and drinking. Stated that he felt "so much better".

## 2013-11-21 NOTE — ED Notes (Signed)
Patient stated he hasn't got this much good sleep in over a week.

## 2013-11-21 NOTE — ED Notes (Signed)
Pt in c/o n/v over the last week, states symptoms have been constant, denies pain, states he is unable to keep food down so he hasn't been eating, decreased urinary output, alert and ambulatory to triage without distress

## 2013-11-21 NOTE — ED Provider Notes (Signed)
CSN: [id number]     Arrival date & time 11/21/13  0146 History   First MD Initiated Contact with Patient 11/21/13 [phone]     Chief Complaint  Patient presents with  . Emesis   (Consider location/radiation/quality/duration/timing/severity/associated sxs/prior Treatment) HPI Patient presents with multiple episodes of vomiting for the past week. He states he's had some upper abdominal discomfort. Vomiting is not always associated with food. He has no fevers or chills. He does have some lightheadedness when standing. He had similar episodes of this in the past that has been associated with heavy alcohol use but states he has not been drinking during this period. He denies blood in the vomit or melanotic stools. He denies NSAID use. He's not been seen by gastroenterologist in the past. He denies chest pain or shortness of breath. History reviewed. No pertinent past medical history. History reviewed. No pertinent past surgical history. History reviewed. No pertinent family history. History  Substance Use Topics  . Smoking status: Current Every Day Smoker    Types: Cigarettes  . Smokeless tobacco: Not on file  . Alcohol Use: Yes     Comment: occ    Review of Systems  Constitutional: Negative for chills and fatigue.  Respiratory: Negative for shortness of breath.   Cardiovascular: Negative for chest pain.  Gastrointestinal: Positive for nausea, vomiting and abdominal pain. Negative for diarrhea, constipation and blood in stool.  Genitourinary: Negative for dysuria, frequency and hematuria.  Musculoskeletal: Negative for back pain, neck pain and neck stiffness.  Skin: Negative for rash and wound.  Neurological: Positive for dizziness and light-headedness. Negative for syncope, weakness, numbness and headaches.  All other systems reviewed and are negative.    Allergies  Review of patient's allergies indicates no known allergies.  Home Medications  No current outpatient prescriptions on  file. BP 90/74  Pulse 120  Temp(Src) 98 F (36.7 C)  Resp 18  SpO2 98% Physical Exam  Nursing note and vitals reviewed. Constitutional: He is oriented to person, place, and time. He appears well-developed and well-nourished. No distress.  HENT:  Head: Normocephalic and atraumatic.  Mouth/Throat: Oropharynx is clear and moist.  Eyes: EOM are normal. Pupils are equal, round, and reactive to light.  Neck: Normal range of motion. Neck supple.  Cardiovascular: Normal rate and regular rhythm.   Pulmonary/Chest: Effort normal and breath sounds normal. No respiratory distress. He has no wheezes. He has no rales.  Abdominal: Soft. Bowel sounds are normal. He exhibits no distension and no mass. There is tenderness (mild epigastric and left upper quadrant tenderness to palpation. No rebound or guarding.). There is no rebound and no guarding.  Musculoskeletal: Normal range of motion. He exhibits no edema and no tenderness.  Neurological: He is alert and oriented to person, place, and time.  Skin: Skin is warm and dry. No rash noted. No erythema.  Psychiatric: He has a normal mood and affect. His behavior is normal.    ED Course  Procedures (including critical care time) Labs Review Labs Reviewed  CBC WITH DIFFERENTIAL - Abnormal; Notable for the following:    WBC 10.9 (*)    RBC 6.15 (*)    Hemoglobin 18.8 (*)    MCHC 36.9 (*)    Monocytes Relative 14 (*)    Monocytes Absolute 1.5 (*)    All other components within normal limits  COMPREHENSIVE METABOLIC PANEL - Abnormal; Notable for the following:    Chloride 95 (*)    Glucose, Bld 126 (*)  BUN 26 (*)    Creatinine, Ser 1.62 (*)    Total Protein 8.5 (*)    GFR calc non Af Amer 50 (*)    GFR calc Af Amer 59 (*)    All other components within normal limits  LIPASE, BLOOD  URINALYSIS, ROUTINE W REFLEX MICROSCOPIC   Imaging Review No results found.  EKG Interpretation   None       MDM    Patient's vital signs have  improved after the IV fluids. He is resting comfortably no distress. Will start on PPI for possible gastritis as the cause of his vomiting. Patient has been advised to follow up with gastroenterology. We'll give PO trial and then discharged home if tolerating fluids.  Julianne Rice, MD 11/21/13 [phone]

## 2014-04-20 ENCOUNTER — Encounter (HOSPITAL_COMMUNITY): Payer: Self-pay | Admitting: Emergency Medicine

## 2014-04-20 ENCOUNTER — Emergency Department (HOSPITAL_COMMUNITY)
Admission: EM | Admit: 2014-04-20 | Discharge: 2014-04-20 | Disposition: A | Discharge status: Home / Self Care | Payer: No Typology Code available for payment source | Attending: Emergency Medicine | Admitting: Emergency Medicine

## 2014-04-20 DIAGNOSIS — Z79899 Other long term (current) drug therapy: Secondary | ICD-10-CM | POA: Insufficient documentation

## 2014-04-20 DIAGNOSIS — F1210 Cannabis abuse, uncomplicated: Secondary | ICD-10-CM | POA: Insufficient documentation

## 2014-04-20 DIAGNOSIS — R112 Nausea with vomiting, unspecified: Secondary | ICD-10-CM | POA: Insufficient documentation

## 2014-04-20 DIAGNOSIS — Z5900 Homelessness unspecified: Secondary | ICD-10-CM | POA: Insufficient documentation

## 2014-04-20 DIAGNOSIS — F17200 Nicotine dependence, unspecified, uncomplicated: Secondary | ICD-10-CM | POA: Insufficient documentation

## 2014-04-20 LAB — COMPREHENSIVE METABOLIC PANEL
ALT: 13 U/L (ref 0–53)
AST: 14 U/L (ref 0–37)
Albumin: 4.4 g/dL (ref 3.5–5.2)
Alkaline Phosphatase: 52 U/L (ref 39–117)
BUN: 22 mg/dL (ref 6–23)
CALCIUM: 9.7 mg/dL (ref 8.4–10.5)
CO2: 26 meq/L (ref 19–32)
CREATININE: 1.06 mg/dL (ref 0.50–1.35)
Chloride: 101 mEq/L (ref 96–112)
GFR, EST NON AFRICAN AMERICAN: 84 mL/min — AB (ref >90)
Glucose, Bld: 115 mg/dL — ABNORMAL HIGH (ref 70–99)
Potassium: 3.6 mEq/L — ABNORMAL LOW (ref 3.7–5.3)
Sodium: 141 mEq/L (ref 137–147)
TOTAL PROTEIN: 7.5 g/dL (ref 6.0–8.3)
Total Bilirubin: 0.6 mg/dL (ref 0.3–1.2)

## 2014-04-20 LAB — RAPID URINE DRUG SCREEN, HOSP PERFORMED
Amphetamines: NOT DETECTED
Barbiturates: NOT DETECTED
Benzodiazepines: NOT DETECTED
COCAINE: NOT DETECTED
OPIATES: NOT DETECTED
Tetrahydrocannabinol: POSITIVE — AB

## 2014-04-20 LAB — CBC WITH DIFFERENTIAL/PLATELET
BASOS ABS: 0 10*3/uL (ref 0.0–0.1)
Basophils Relative: 0 % (ref 0–1)
EOS ABS: 0 10*3/uL (ref 0.0–0.7)
EOS PCT: 0 % (ref 0–5)
HEMATOCRIT: 45.6 % (ref 39.0–52.0)
Hemoglobin: 16.2 g/dL (ref 13.0–17.0)
LYMPHS PCT: 11 % — AB (ref 12–46)
Lymphs Abs: 0.6 10*3/uL — ABNORMAL LOW (ref 0.7–4.0)
MCH: 29.7 pg (ref 26.0–34.0)
MCHC: 35.5 g/dL (ref 30.0–36.0)
MCV: 83.7 fL (ref 78.0–100.0)
MONO ABS: 0.1 10*3/uL (ref 0.1–1.0)
Monocytes Relative: 3 % (ref 3–12)
Neutro Abs: 4.9 10*3/uL (ref 1.7–7.7)
Neutrophils Relative %: 86 % — ABNORMAL HIGH (ref 43–77)
PLATELETS: 213 10*3/uL (ref 150–400)
RBC: 5.45 MIL/uL (ref 4.22–5.81)
RDW: 14.2 % (ref 11.5–15.5)
WBC: 5.6 10*3/uL (ref 4.0–10.5)

## 2014-04-20 LAB — URINALYSIS, ROUTINE W REFLEX MICROSCOPIC
Bilirubin Urine: NEGATIVE
Glucose, UA: NEGATIVE mg/dL
HGB URINE DIPSTICK: NEGATIVE
Ketones, ur: 15 mg/dL — AB
Leukocytes, UA: NEGATIVE
NITRITE: NEGATIVE
PH: 6 (ref 5.0–8.0)
Protein, ur: NEGATIVE mg/dL
SPECIFIC GRAVITY, URINE: 1.019 (ref 1.005–1.030)
UROBILINOGEN UA: 1 mg/dL (ref 0.0–1.0)

## 2014-04-20 LAB — CBG MONITORING, ED: Glucose-Capillary: 119 mg/dL — ABNORMAL HIGH (ref 70–99)

## 2014-04-20 LAB — ETHANOL

## 2014-04-20 LAB — I-STAT CG4 LACTIC ACID, ED: Lactic Acid, Venous: 1.62 mmol/L (ref 0.5–2.2)

## 2014-04-20 LAB — I-STAT TROPONIN, ED: Troponin i, poc: 0 ng/mL (ref 0.00–0.08)

## 2014-04-20 MED ORDER — ONDANSETRON HCL 4 MG PO TABS: 4.0000 mg | ORAL_TABLET | Freq: Four times a day (QID) | ORAL | Status: DC

## 2014-04-20 MED ORDER — PROMETHAZINE HCL 25 MG/ML IJ SOLN
25.0000 mg | Freq: Once | INTRAMUSCULAR | Status: AC
  Administered 2014-04-20: 25 mg via INTRAVENOUS
  Filled 2014-04-20: qty 1

## 2014-04-20 MED ORDER — ONDANSETRON HCL 4 MG/2ML IJ SOLN
4.0000 mg | Freq: Once | INTRAMUSCULAR | Status: AC
  Administered 2014-04-20: 4 mg via INTRAVENOUS
  Filled 2014-04-20: qty 2

## 2014-04-20 MED ORDER — SODIUM CHLORIDE 0.9 % IV BOLUS (SEPSIS)
1000.0000 mL | Freq: Once | INTRAVENOUS | Status: AC
  Administered 2014-04-20: 1000 mL via INTRAVENOUS

## 2014-04-20 MED ORDER — PROMETHAZINE HCL 25 MG PO TABS: 25.0000 mg | ORAL_TABLET | Freq: Four times a day (QID) | ORAL | Status: DC | PRN

## 2014-04-20 MED ORDER — ONDANSETRON HCL 4 MG/2ML IJ SOLN: 4.0000 mg | Freq: Once | INTRAMUSCULAR | Status: DC

## 2014-04-20 NOTE — ED Notes (Signed)
Patient stated he feels nausea after drinking a cup of water.

## 2014-04-20 NOTE — ED Notes (Signed)
Lactic acid result given to dr ward

## 2014-04-20 NOTE — Discharge Instructions (Signed)
Marijuana Abuse and Chemical Dependency WHEN IS DRUG USE A PROBLEM? Problems related to drug use usually begin with abuse of the substance and lead to dependency.  Abuse is repeated use of a drug with recurrent and significant negative consequences. Abuse happens anytime drug use is interfering with normal living activities including:   Failure to fulfill major obligations at work, school or home (poor work Systems analyst, missing work or school and/or neglecting children and home).  Engaging in activities that are physically dangerous (driving a car or doing recreational activities such as swimming or rock climbing) while under the effects of the drug.  Recurrent drug-related legal problems (arrests for disorderly conduct or assault and battery).  Recurrent social or interpersonal problems caused or increased by the effects of the drug (arguments with family or friends, or physical fights). Dependency has two parts.   You first develop an emotional/psychological dependence. Psychological dependence develops when your mind tells you that the drug is needed. You come to believe it helps you cope with life.  This is usually followed by physical dependence which has developed when continuing increases of drugs are required to get the same feeling or "high." This may result in:  Withdrawal symptoms such as shakes or tremors.  The substance being over a longer period of time than intended.  An ongoing desire, or unsuccessful effort to, cut down or control the use.  Greater amounts of time spent getting the drug, using the drug or recovering from the effects of the drug.  Important social, work or interests and activities are given up or reduced because or drug use.  Substance is used despite knowledge of ongoing physical (ulcers) or psychological (depression) problems. SIGNS OF CHEMICAL DEPENDENCY:  Friends or family say there is a problem.  Fighting when using drugs.  Having blackouts  (not remembering what you do while using).  Feel sick from using drugs but continue using.  Lie about use or amounts of drugs used.  Need drugs to get you going.  Need drugs to relate to people or feel comfortable in social situations.  Use drugs to forget problems. A "yes" answered to any of the above signs of chemical dependency indicates there are problems. The longer the use of drugs continues, the greater the problems will become. If there is a family history of drug or alcohol use it is best not to experiment with drugs. Experimentation leads to tolerance. Addiction is followed by dependency where drugs are now needed not just to get high but to feel normal. Addiction cannot be cured but it can be stopped. This often requires outside help and the care of professionals. Treatment centers are listed in the yellow pages under: Cocaine, Narcotics, and Alcoholics anonymous. Most hospitals and clinics can refer you to a specialized care center. WHAT IS MARIJUANA? Marijuana is a plant which grows wild all over the world. The plant contains many chemicals but the active ingredient of the plant is THC (tetrahydrocannabinol). This is responsible for the "high" perceived by people using the drug. HOW IS MARIJUANA USED? Marijuana is smoked, eaten in brownies or any other food, and drank as a tea. WHAT ARE THE EFFECTS OF MARIJUANA? Marijuana is a nervous system depressant which slows the thinking process. Because of this effect, users think marijuana has a calming effect. Actually what happens is the air carrying tubules in the lung become relaxed and allow more oxygen to enter. This causes the user to feel high. The blood pressure falls so less blood  reaches the brain and the heart speeds up. As the effects wear off the user becomes depressed. Some people become very paranoid during use. They feel as though people are out to get them. Periodic use can interfere with performance at school or work.  Generally Marijuana use does not develop into a physical dependence, but it is very habit forming. Marijuana is also seen as a gateway to use of harder drugs. Strong habits such as using Marijuana, as with all drugs and addictions, can only be helped by stopping use of all chemicals. This is hard but may save your life.  OTHER HEALTH RISKS OF MARIJUANA AND DRUG USE ARE: The increased possibility of getting AIDS or hepatitis (liver inflammation).  HOW TO STAY DRUG FREE ONCE YOU HAVE QUIT USING:  Develop healthy activities and form friends who do not use drugs.  Stay away from the drug scene.  Tell the those who want you to use drugs you have other, better things to do.  Have ready excuses available about why you cannot use.  Attend 12-Step Meetings for support from other recovering people. FOR MORE HELP OR INFORMATION CONTACT YOUR LOCAL CAREGIVER, CLINIC, [place]. Document Released: 10/31/2000 Document Revised: 02/28/2013 Document Reviewed: 12/01/2007 [facility] Patient Information 2014 [place].  Cyclic Vomiting Syndrome Cyclic vomiting syndrome is a benign condition in which patients experience bouts or cycles of severe nausea and vomiting that last for hours or even days. The bouts of nausea and vomiting alternate with longer periods of no symptoms and generally good health. Cyclic vomiting syndrome occurs mostly in children, but can affect adults. CAUSES  [facility] has no known cause. Each episode is typically similar to the previous ones. The episodes tend to:   Start at about the same time of day.  Last the same length of time.  Present the same symptoms at the same level of intensity. Cyclic vomiting syndrome can begin at any age in children and adults. Cyclic vomiting syndrome usually starts between the ages of 3 and 7 years. In adults, episodes tend to occur less often than they do in children, but they last longer. Furthermore, the events or situations that trigger episodes  in adults cannot always be pinpointed as easily as they can in children. There are 4 phases of cyclic vomiting syndrome: 1. Prodrome. The prodrome phase signals that an episode of nausea and vomiting is about to begin. This phase can last from just a few minutes to several hours. This phase is often marked by belly (abdominal) pain. Sometimes taking medicine early in the prodrome phase can stop an episode in progress. However, sometimes there is no warning. A person may simply wake up in the middle of the night or early morning and begin vomiting. 2. Episode. The episode phase consists of:  Severe vomiting.  Nausea.  Gagging (retching). 3. Recovery. The recovery phase begins when the nausea and vomiting stop. Healthy color, appetite, and energy return. 4. Symptom-free interval. The symptom-free interval phase is the period between episodes when no symptoms are present. TRIGGERS Episodes can be triggered by an infection or event. Examples of triggers include:  Infections.  Colds, allergies, sinus problems, and the flu.  Eating certain foods such as chocolate or cheese.  Foods with monosodium glutamate (MSG) or preservatives.  Fast foods.  Pre-packaged foods.  Foods with low nutritional value (junk foods).  Overeating.  Eating just before going to bed.  Hot weather.  Dehydration.  Not enough sleep or poor sleep quality.  Physical  exhaustion.  Menstruation.  Motion sickness.  Emotional stress (school or home difficulties).  Excitement or stress. SYMPTOMS  The main symptoms of cyclic vomiting syndrome are:  Severe vomiting.  Nausea.  Gagging (retching). Episodes usually begin at night or the first thing in the morning. Episodes may include vomiting or retching up to 5 or 6 times an hour during the worst of the episode. Episodes usually last anywhere from 1 to 4 days. Episodes can last for up to 10 days. Other symptoms  include:  Paleness.  Exhaustion.  Listlessness.  Abdominal pain.  Loose stools or diarrhea. Sometimes the nausea and vomiting are so severe that a person appears to be almost unconscious. Sensitivity to light, headache, fever, dizziness, may also accompany an episode. In addition, the vomiting may cause drooling and excessive thirst. Drinking water usually leads to more vomiting, though the water can dilute the acid in the vomit, making the episode a little less painful. Continuous vomiting can lead to dehydration, which means that the body has lost excessive water and salts. DIAGNOSIS  Cyclic vomiting syndrome is hard to diagnose because there are no clear tests to identify it. A caregiver must diagnose cyclic vomiting syndrome by looking at symptoms and medical history. A caregiver must exclude more common diseases or disorders that can also cause nausea and vomiting. Also, diagnosis takes time because caregivers need to identify a pattern or cycle to the vomiting. TREATMENT  Cyclic vomiting syndrome cannot be cured. Treatment varies, but people with cyclic vomiting syndrome should get plenty of rest and sleep and take medications that prevent, stop, or lessen the vomiting episodes and other symptoms. People whose episodes are frequent and long-lasting may be treated during the symptom-free intervals in an effort to prevent or ease future episodes. The symptom-free phase is a good time to eliminate anything known to trigger an episode. For example, if episodes are brought on by stress or excitement, this period is the time to find ways to reduce stress and stay calm. If sinus problems or allergies cause episodes, those conditions should be treated. The triggers listed above should be avoided or prevented. Because of the similarities between migraine and cyclic vomiting syndrome, caregivers treat some people with severe cyclic vomiting syndrome with drugs that are also used for migraine headaches.  The drugs are designed to:  Prevent episodes.  Reduce their frequency.  Lessen their severity. HOME CARE INSTRUCTIONS Once a vomiting episode begins, treatment is supportive. It helps to stay in bed and sleep in a dark, quiet room. Severe nausea and vomiting may require hospitalization and intravenous (IV) fluids to prevent dehydration. Relaxing medications (sedatives) may help if the nausea continues. Sometimes, during the prodrome phase, it is possible to stop an episode from happening altogether. Only take over-the-counter or prescription medicines for pain, discomfort or fever as directed by your caregiver. Do not give aspirin to children. During the recovery phase, drinking water and replacing lost electrolytes (salts in the blood) are very important. Electrolytes are salts that the body needs to function well and stay healthy. Symptoms during the recovery phase can vary. Some people find that their appetites return to normal immediately, while others need to begin by drinking clear liquids and then move slowly to solid food. RELATED COMPLICATIONS The severe vomiting that defines cyclic vomiting syndrome is a risk factor for several complications:  Dehydration Vomiting causes the body to lose water quickly.  Electrolyte imbalance Vomiting also causes the body to lose the important salts it needs to  keep working properly.  Peptic esophagitis The tube that connects the mouth to the stomach (esophagus) becomes injured from the stomach acid that comes up with the vomit.  Hematemesis The esophagus becomes irritated and bleeds, so blood mixes with the vomit.  Mallory-Weiss tear The lower end of the esophagus may tear open or the stomach may bruise from vomiting or retching.  Tooth decay The acid in the vomit can hurt the teeth by corroding the tooth enamel. SEEK MEDICAL CARE IF: You have questions or problems. Document Released: 01/12/2002 Document Revised: 01/26/2012 Document Reviewed:  02/10/2011 [facility] Patient Information 2014 [place], [place].  Nausea and Vomiting Nausea is a sick feeling that often comes before throwing up (vomiting). Vomiting is a reflex where stomach contents come out of your mouth. Vomiting can cause severe loss of body fluids (dehydration). Children and elderly adults can become dehydrated quickly, especially if they also have diarrhea. Nausea and vomiting are symptoms of a condition or disease. It is important to find the cause of your symptoms. CAUSES   Direct irritation of the stomach lining. This irritation can result from increased acid production (gastroesophageal reflux disease), infection, food poisoning, taking certain medicines (such as nonsteroidal anti-inflammatory drugs), alcohol use, or tobacco use.  Signals from the brain.These signals could be caused by a headache, heat exposure, an inner ear disturbance, increased pressure in the brain from injury, infection, a tumor, or a concussion, pain, emotional stimulus, or metabolic problems.  An obstruction in the gastrointestinal tract (bowel obstruction).  Illnesses such as diabetes, hepatitis, gallbladder problems, appendicitis, kidney problems, cancer, sepsis, atypical symptoms of a heart attack, or eating disorders.  Medical treatments such as chemotherapy and radiation.  Receiving medicine that makes you sleep (general anesthetic) during surgery. DIAGNOSIS Your caregiver may ask for tests to be done if the problems do not improve after a few days. Tests may also be done if symptoms are severe or if the reason for the nausea and vomiting is not clear. Tests may include:  Urine tests.  Blood tests.  Stool tests.  Cultures (to look for evidence of infection).  X-rays or other imaging studies. Test results can help your caregiver make decisions about treatment or the need for additional tests. TREATMENT You need to stay well hydrated. Drink frequently but in small amounts.You may  wish to drink water, sports drinks, clear broth, or eat frozen ice pops or gelatin dessert to help stay hydrated.When you eat, eating slowly may help prevent nausea.There are also some antinausea medicines that may help prevent nausea. HOME CARE INSTRUCTIONS   Take all medicine as directed by your caregiver.  If you do not have an appetite, do not force yourself to eat. However, you must continue to drink fluids.  If you have an appetite, eat a normal diet unless your caregiver tells you differently.  Eat a variety of complex carbohydrates (rice, wheat, potatoes, bread), lean meats, yogurt, fruits, and vegetables.  Avoid high-fat foods because they are more difficult to digest.  Drink enough water and fluids to keep your urine clear or pale yellow.  If you are dehydrated, ask your caregiver for specific rehydration instructions. Signs of dehydration may include:  Severe thirst.  Dry lips and mouth.  Dizziness.  Dark urine.  Decreasing urine frequency and amount.  Confusion.  Rapid breathing or pulse. SEEK IMMEDIATE MEDICAL CARE IF:   You have blood or brown flecks (like coffee grounds) in your vomit.  You have black or bloody stools.  You have a  severe headache or stiff neck.  You are confused.  You have severe abdominal pain.  You have chest pain or trouble breathing.  You do not urinate at least once every 8 hours.  You develop cold or clammy skin.  You continue to vomit for longer than 24 to 48 hours.  You have a fever. MAKE SURE YOU:   Understand these instructions.  Will watch your condition.  Will get help right away if you are not doing well or get worse. Document Released: 11/03/2005 Document Revised: 01/26/2012 Document Reviewed: 04/02/2011 [facility] Patient Information 2014 [place], [place].

## 2014-04-20 NOTE — Progress Notes (Signed)
CSW consult to pt regarding homeless issues. CSW spoke with pt and [name] Percell Miller who has been assisting pt with finding housing. Pt shared with CSW that he signed a lease today and will have a place to live effective today. CSW gave pt information to [facility] for anger and depression concerns. Pt reported that he is concerned about recent weight loss which is the cause of the ED visit today. CM, Joylene John spoke with pt regarding obtaining a PCP. Pt awaiting disposition.   [address], [place]

## 2014-04-20 NOTE — ED Provider Notes (Addendum)
TIME SEEN: 11:43 AM  CHIEF COMPLAINT: Nausea, vomiting  HPI: Patient is a 43 y.o. M who is homeless with history of tobacco use, heavy alcohol and marijuana use who presents emergency Department nausea and vomiting for the past 6 days. He states he's had this same history on off for many years. He has never been followed by outpatient physician for this. He's been in the emergency room multiple times with negative workup and has been discharged him once his symptoms are controlled. He denies any abdominal pain or chest pain. No shortness of breath. No diarrhea. No bloody stool or melena. No dysuria or hematuria. No penile discharge, testicular pain or swelling.  ROS: See HPI Constitutional: no fever  Eyes: no drainage  ENT: no runny nose   Cardiovascular:  no chest pain  Resp: no SOB  GI: no vomiting GU: no dysuria Integumentary: no rash  Allergy: no hives  Musculoskeletal: no leg swelling  Neurological: no slurred speech ROS otherwise negative  PAST MEDICAL HISTORY/PAST SURGICAL HISTORY:  History reviewed. No pertinent past medical history.  MEDICATIONS:  Prior to Admission medications   Medication Sig Start Date End Date Taking? Authorizing Provider  lansoprazole (PREVACID) 30 MG capsule Take 1 capsule (30 mg total) by mouth daily at 12 noon. 11/21/13   Julianne Rice, MD  ondansetron (ZOFRAN ODT) 4 MG disintegrating tablet 4mg  ODT q4 hours prn nausea/vomit 11/21/13   Julianne Rice, MD    ALLERGIES:  No Known Allergies  SOCIAL HISTORY:  History  Substance Use Topics  . Smoking status: Current Every Day Smoker    Types: Cigarettes  . Smokeless tobacco: Not on file  . Alcohol Use: Yes     Comment: occ    FAMILY HISTORY: History reviewed. No pertinent family history.  EXAM: BP 135/77  Pulse 49  Temp(Src) 97.4 F (36.3 C) (Oral)  Resp 18  Ht 6\' 3"  (1.905 m)  Wt 160 lb (72.576 kg)  BMI 20.00 kg/m2  SpO2 100% CONSTITUTIONAL: Alert and oriented and responds  appropriately to questions. Well-appearing; well-nourished, nontoxic, appears well hydrated, in no distress, not actively vomiting HEAD: Normocephalic EYES: Conjunctivae clear, PERRL ENT: normal nose; no rhinorrhea; moist mucous membranes; pharynx without lesions noted NECK: Supple, no meningismus, no LAD  CARD: RRR; S1 and S2 appreciated; no murmurs, no clicks, no rubs, no gallops RESP: Normal chest excursion without splinting or tachypnea; breath sounds clear and equal bilaterally; no wheezes, no rhonchi, no rales,  ABD/GI: Normal bowel sounds; non-distended; soft, non-tender, no rebound, no guarding, no peritoneal signs BACK:  The back appears normal and is non-tender to palpation, there is no CVA tenderness EXT: Normal ROM in all joints; non-tender to palpation; no edema; normal capillary refill; no cyanosis    SKIN: Normal color for age and race; warm NEURO: Moves all extremities equally, no facial droop or slurred speech, normal gait PSYCH: The patient's mood and manner are appropriate. Grooming and personal hygiene are appropriate.  MEDICAL DECISION MAKING: Patient here with nausea and vomiting for the past 6 days. Differential diagnosis includes gastritis, ACS, DKA, viral illness, gastroparesis, pancreatitis. He states this has been happening for several years. He states he feels this may be do to his alcohol use but states he hasn't drink in 3 days. He also states that he uses marijuana occasionally. His abdominal exam is completely benign. We'll obtain labs to evaluate for any electrolyte abnormality. We'll also obtain EKG and troponin. We'll give IV fluids, Zofran and reassess. We'll consult case management  and social work for help with getting this patient outpatient followup. I do not feel he needs abdominal imaging at this time given his exam is very benign.  ED PROGRESS: Doris with social work has seen the patient. It appears patient signed a lease for a house today. Case management is  currently working with the patient to establish outpatient care.   1:53 PM  Pt's labs and urine are unremarkable. He does have a small amount of ketones in his urine. He is receiving 2 L of IV fluid. He states he is feeling much better after IV Zofran. Will by mouth challenge. Troponin is negative. EKG shows early repolarization but no ischemic changes.   3:11 PM  Pt has been able to tolerate by mouth and has had no vomiting in the ER. Patient initially was time he was feeling much better but whenever his friend is not at bedside, he begins to state that he feels more nauseous and his nausea is still a "7/10". I suspect that his friend at the bedside is enabling the patient. Have explained to patient and this friend that he has not had any vomiting and has no blood work abnormalities to suggest that he has been vomiting for 6 days and has lost 20 pounds in several months. Given he has been able to tolerate by mouth without difficulty, I feel he is safe to be discharged home and will be discharged with prescription for Phenergan and Zofran with coupons for both. He [facility] with [facility] on Monday. Have discussed with patient I recommend he avoid marijuana and alcohol use this is likely contributing to his symptoms. Patient verbalizes understanding and states he is comfortable with this plan but it is clear that his friend is not happy with this plan. Have discussed with patient and patient's friend that he will need a further workup as an outpatient but does not need one emergently.   EKG Interpretation  Date/Time:  Thursday April 20 2014 11:46:28 EDT Ventricular Rate:  53 PR Interval:  113 QRS Duration: 129 QT Interval:  466 QTC Calculation: 437 R Axis:   7 Text Interpretation:  Sinus rhythm Borderline short PR interval IVCD, consider atypical RBBB ST elev, probable normal early repol pattern No reciprocal changes No significant change since last tracing Confirmed by  WARD,  DO, KRISTEN [phone]) on 04/20/2014 11:59:30 AM         Delice Bison Ward, DO 04/20/14 [place], DO 04/20/14 [id number]

## 2014-04-20 NOTE — ED Notes (Signed)
No palpable liver border or spleen detected. Percussed over spleen and it was tympanic. Some LUQ tenderness and dullness upon percussion. Typmany in rest of fields.

## 2014-04-20 NOTE — ED Notes (Signed)
Notified RN of CBG 119

## 2014-04-20 NOTE — Progress Notes (Signed)
  CARE MANAGEMENT ED NOTE 04/20/2014  Patient:  Henry Kramer, Henry Kramer   Account Number:  [id number]  Date Initiated:  04/20/2014  Documentation initiated by:  Edwyna Shell  Subjective/Objective Assessment:   43 yo male presenting to the ED with N/V     Subjective/Objective Assessment Detail:   HPI: Patient is a 43 y.o. M who is homeless with history of tobacco use, heavy alcohol and marijuana use who presents emergency Department nausea and vomiting for the past 6 days. He states he's had this same history on off for many years. He has never been followed by outpatient physician for this. He's been in the emergency room multiple times with negative workup and has been discharged him once his symptoms are controlled. He denies any abdominal pain or chest pain. No shortness of breath. No diarrhea. No bloody stool or melena. No dysuria or hematuria. No penile discharge, testicular pain or swelling.     Action/Plan:   Patient is to follow up with with PCP   Action/Plan Detail:   Anticipated DC Date:       Status Recommendation to Physician:   Result of Recommendation:  Agreed    DC Planning Services  CM consult  PCP issues  Other    Choice offered to / List presented to:  C-1 Patient          Status of service:  Completed, signed off  ED Comments:   ED Comments Detail:  CM consulted for no PCP. CM spoke with patient and friend, Henry Kramer, and patient and friend stated that the patient does not have a PCP, is unemployed, and does not have health insurance. Provided a resource list to the patient and his friend and directed them to the [facility] at [place] or the [facility]. [name] stated that she was aware of the [facility] and she prefers following with them but understands they have a wait. Provided a brochure with the [facility] information and explained that they should not only schedule a PCP visit but also a financial counselor appointment so he can be connected with resources. [name] had no  further questions. CSW, Tamela Oddi, was also providing the patient with community resources.

## 2014-04-20 NOTE — ED Notes (Signed)
Pt brought back to room; pt getting undressed and into a gown at this time; Gregary Signs, RN present in room

## 2014-04-20 NOTE — Progress Notes (Signed)
Responded to referral from Congregational Nurse Staff to meet patient and community homeless  adovocate in the ED to provide emotional, spiritual and social support. Patient came to the ED complaining of weakness, and weighth loss, no food since [place].  After the  ED  doctor initial  assestment of patient it was determined that patient will probably need an appointment with a primary physician for further care. I asked ED doctor to put in a consult for CSW and Care Management intervention. Consults were made and CSW and Care management met with patient and patient's friend at bedside for further assessment.  Patient has a 8 yr history of  being homeless.  According to patient's friend prior to coming to ED patient was provided with an apartment this morning.   I provided emotional and social support to both patient and community advocate. Contact information for [employer] is [name] [phone]. Will follow as needed.  04/20/14 1200  Clinical Encounter Type  Visited With Patient;Health care provider;Other (Comment) (community HomelessAdvocate and friend)  Visit Type Initial;Spiritual support;Social support;ED  Referral From Other (Comment) Environmental manager)  Consult/Referral To Social work;Other (Comment) (Care Management)  Spiritual Encounters  Spiritual Needs Emotional  Stress Factors  Patient Stress Factors None identified  Myles Gip, [place]

## 2014-04-20 NOTE — ED Notes (Signed)
Pt presents with 6 day h/o nausea.  +vomiting, denies diarrhea; pt denies any abdominal pain, reports h/o same "for years".

## 2014-04-21 ENCOUNTER — Encounter: Payer: Self-pay | Admitting: Internal Medicine

## 2014-04-21 ENCOUNTER — Ambulatory Visit: Payer: No Typology Code available for payment source | Attending: Internal Medicine | Admitting: Internal Medicine

## 2014-04-21 VITALS — BP 113/75 | HR 63 | Temp 98.2°F | Resp 17 | Wt 158.0 lb

## 2014-04-21 DIAGNOSIS — Z139 Encounter for screening, unspecified: Secondary | ICD-10-CM

## 2014-04-21 DIAGNOSIS — F1210 Cannabis abuse, uncomplicated: Secondary | ICD-10-CM | POA: Insufficient documentation

## 2014-04-21 DIAGNOSIS — F1911 Other psychoactive substance abuse, in remission: Secondary | ICD-10-CM

## 2014-04-21 DIAGNOSIS — R110 Nausea: Secondary | ICD-10-CM | POA: Insufficient documentation

## 2014-04-21 DIAGNOSIS — F1910 Other psychoactive substance abuse, uncomplicated: Secondary | ICD-10-CM

## 2014-04-21 DIAGNOSIS — F17200 Nicotine dependence, unspecified, uncomplicated: Secondary | ICD-10-CM | POA: Insufficient documentation

## 2014-04-21 LAB — COMPLETE METABOLIC PANEL WITH GFR
ALBUMIN: 4.2 g/dL (ref 3.5–5.2)
ALT: 12 U/L (ref 0–53)
AST: 11 U/L (ref 0–37)
Alkaline Phosphatase: 47 U/L (ref 39–117)
BUN: 17 mg/dL (ref 6–23)
CALCIUM: 9.4 mg/dL (ref 8.4–10.5)
CHLORIDE: 104 meq/L (ref 96–112)
CO2: 31 mEq/L (ref 19–32)
Creat: 1.11 mg/dL (ref 0.50–1.35)
GFR, EST NON AFRICAN AMERICAN: 81 mL/min
GLUCOSE: 100 mg/dL — AB (ref 70–99)
Potassium: 4.2 mEq/L (ref 3.5–5.3)
Sodium: 141 mEq/L (ref 135–145)
Total Bilirubin: 1.2 mg/dL (ref 0.2–1.2)
Total Protein: 6.4 g/dL (ref 6.0–8.3)

## 2014-04-21 LAB — CBC WITH DIFFERENTIAL/PLATELET
BASOS PCT: 0 % (ref 0–1)
Basophils Absolute: 0 10*3/uL (ref 0.0–0.1)
Eosinophils Absolute: 0 10*3/uL (ref 0.0–0.7)
Eosinophils Relative: 0 % (ref 0–5)
HCT: 46 % (ref 39.0–52.0)
Hemoglobin: 16.2 g/dL (ref 13.0–17.0)
Lymphocytes Relative: 33 % (ref 12–46)
Lymphs Abs: 1.9 10*3/uL (ref 0.7–4.0)
MCH: 29.3 pg (ref 26.0–34.0)
MCHC: 35.2 g/dL (ref 30.0–36.0)
MCV: 83.3 fL (ref 78.0–100.0)
MONOS PCT: 10 % (ref 3–12)
Monocytes Absolute: 0.6 10*3/uL (ref 0.1–1.0)
NEUTROS ABS: 3.2 10*3/uL (ref 1.7–7.7)
Neutrophils Relative %: 57 % (ref 43–77)
Platelets: 222 10*3/uL (ref 150–400)
RBC: 5.52 MIL/uL (ref 4.22–5.81)
RDW: 15 % (ref 11.5–15.5)
WBC: 5.7 10*3/uL (ref 4.0–10.5)

## 2014-04-21 LAB — LIPID PANEL
Cholesterol: 122 mg/dL (ref 0–200)
HDL: 46 mg/dL (ref >39)
LDL Cholesterol: 64 mg/dL (ref 0–99)
Total CHOL/HDL Ratio: 2.7 Ratio
Triglycerides: 61 mg/dL (ref <150)
VLDL: 12 mg/dL (ref 0–40)

## 2014-04-21 LAB — TSH: TSH: 0.83 u[IU]/mL (ref 0.350–4.500)

## 2014-04-21 NOTE — Progress Notes (Signed)
Patient Demographics  Henry Kramer, is a 43 y.o. male  IEP:[id number]  YSA:[id number]  DOB - 1971-10-04  CC:  Chief Complaint  Patient presents with  . Establish Care       HPI: Henry Kramer is a 43 y.o. male here today to establish medical care.Patient recently went to the emergency room with symptoms of nausea vomiting, EMR reviewed patient was given IV fluids and her Zofran and is symptomatically improved his blood work was negative except for urine drug screen positive for marijuana. Patient was prescribed Phenergan. As per patient he took the medication and symptoms are improved but he has the symptoms of chronic nausea for several years and is requesting to see a specialist, denies any fever chills chest shortness of breath. Patient does smoke cigarettes, I have advised patient to quit smoking.  Patient has No headache, No chest pain, No abdominal pain - No Nausea, No new weakness tingling or numbness, No Cough - SOB.  No Known Allergies History reviewed. No pertinent past medical history. Current Outpatient Prescriptions on File Prior to Visit  Medication Sig Dispense Refill  . esomeprazole (NEXIUM) 40 MG capsule Take 40 mg by mouth daily at 12 noon.      . ondansetron (ZOFRAN) 4 MG tablet Take 1 tablet (4 mg total) by mouth every 6 (six) hours.  12 tablet  0  . promethazine (PHENERGAN) 25 MG tablet Take 1 tablet (25 mg total) by mouth every 6 (six) hours as needed for nausea or vomiting.  15 tablet  0   No current facility-administered medications on file prior to visit.   History reviewed. No pertinent family history. History   Social History  . Marital Status: Single    Spouse Name: N/A    Number of Children: N/A  . Years of Education: N/A   Occupational History  . Not on file.   Social History Main Topics  . Smoking status: Current Every Day Smoker -- 0.50 packs/day for 20 years    Types: Cigarettes  . Smokeless tobacco: Not on file  . Alcohol Use:  Yes     Comment: occ  . Drug Use: Yes    Special: Marijuana  . Sexual Activity: Not on file   Other Topics Concern  . Not on file   Social History Narrative  . No narrative on file    Review of Systems: Constitutional: Negative for fever, chills, diaphoresis, activity change, appetite change and fatigue. HENT: Negative for ear pain, nosebleeds, congestion, facial swelling, rhinorrhea, neck pain, neck stiffness and ear discharge.  Eyes: Negative for pain, discharge, redness, itching and visual disturbance. Respiratory: Negative for cough, choking, chest tightness, shortness of breath, wheezing and stridor.  Cardiovascular: Negative for chest pain, palpitations and leg swelling. Gastrointestinal: Negative for abdominal distention. Genitourinary: Negative for dysuria, urgency, frequency, hematuria, flank pain, decreased urine volume, difficulty urinating and dyspareunia.  Musculoskeletal: Negative for back pain, joint swelling, arthralgia and gait problem. Neurological: Negative for dizziness, tremors, seizures, syncope, facial asymmetry, speech difficulty, weakness, light-headedness, numbness and headaches.  Hematological: Negative for adenopathy. Does not bruise/bleed easily. Psychiatric/Behavioral: Negative for hallucinations, behavioral problems, confusion, dysphoric mood, decreased concentration and agitation.    Objective:   Filed Vitals:   04/21/14 0955  BP: 113/75  Pulse: 63  Temp: 98.2 F (36.8 C)  Resp: 17    Physical Exam: Constitutional: Patient appears well-developed and well-nourished. No distress. HENT: Normocephalic, atraumatic, External right and left ear normal. Oropharynx is clear and moist.  Eyes: Conjunctivae and EOM are normal. PERRLA, no scleral icterus. Neck: Normal ROM. Neck supple. No JVD. No tracheal deviation. No thyromegaly. CVS: RRR, S1/S2 +, no murmurs, no gallops, no carotid bruit.  Pulmonary: Effort and breath sounds normal, no stridor,  rhonchi, wheezes, rales.  Abdominal: Soft. BS +, no distension, tenderness, rebound or guarding.  Musculoskeletal: Normal range of motion. No edema and no tenderness.  Neuro: Alert. Normal reflexes, muscle tone coordination. No cranial nerve deficit. Skin: Skin is warm and dry. No rash noted. Not diaphoretic. No erythema. No pallor. Psychiatric: Normal mood and affect. Behavior, judgment, thought content normal.  Lab Results  Component Value Date   WBC 5.6 04/20/2014   HGB 16.2 04/20/2014   HCT 45.6 04/20/2014   MCV 83.7 04/20/2014   PLT 213 04/20/2014   Lab Results  Component Value Date   CREATININE 1.06 04/20/2014   BUN 22 04/20/2014   NA 141 04/20/2014   K 3.6* 04/20/2014   CL 101 04/20/2014   CO2 26 04/20/2014    No results found for this basename: HGBA1C   Lipid Panel  No results found for this basename: chol,  trig,  hdl,  cholhdl,  vldl,  ldlcalc       Assessment and plan:   1. Chronic nausea Continue with Phenergan and Zofran - Ambulatory referral to Gastroenterology  2. Smoking Advise patient to quit smoking  3. Screening Baseline blood work - CBC with Differential - COMPLETE METABOLIC PANEL WITH GFR - TSH - Lipid panel - Vit D  25 hydroxy (rtn osteoporosis monitoring)  4. History of substance abuse Patient used marijuana recently.   Return in about 3 months (around 07/22/2014).   Lorayne Marek, MD

## 2014-04-21 NOTE — Progress Notes (Signed)
Patient here to establish care States has been [place] the [place] And recently has received housing and will be living in his own apartment Complains of nausea for the past couple of weeks Decreased appetite and substantial weight loss Was seen yesterday in the ED for his symptoms

## 2014-04-22 LAB — VITAMIN D 25 HYDROXY (VIT D DEFICIENCY, FRACTURES): VIT D 25 HYDROXY: 31 ng/mL (ref 30–89)

## 2014-04-24 ENCOUNTER — Telehealth: Payer: Self-pay

## 2014-04-24 NOTE — Telephone Encounter (Signed)
Message copied by Dorothe Pea on Mon Apr 24, 2014 11:19 AM ------      Message from: Lorayne Marek      Created: Mon Apr 24, 2014  9:33 AM       Blood work reviewed noticed impaired fasting glucose, call and advise patient for low carbohydrate diet.       ------

## 2014-04-24 NOTE — Telephone Encounter (Signed)
Patient not available Left message on voice mail to return our call 

## 2014-04-27 ENCOUNTER — Encounter: Payer: Self-pay | Admitting: Internal Medicine

## 2014-05-12 ENCOUNTER — Ambulatory Visit: Payer: Self-pay

## 2014-05-22 ENCOUNTER — Encounter (HOSPITAL_COMMUNITY): Payer: Self-pay | Admitting: Emergency Medicine

## 2014-05-22 ENCOUNTER — Observation Stay (HOSPITAL_COMMUNITY)
Admission: EM | Admit: 2014-05-22 | Discharge: 2014-05-23 | Disposition: A | Discharge status: Home / Self Care | Payer: No Typology Code available for payment source | Attending: Oncology | Admitting: Oncology

## 2014-05-22 ENCOUNTER — Emergency Department (HOSPITAL_COMMUNITY): Payer: No Typology Code available for payment source

## 2014-05-22 DIAGNOSIS — R42 Dizziness and giddiness: Secondary | ICD-10-CM | POA: Insufficient documentation

## 2014-05-22 DIAGNOSIS — F1910 Other psychoactive substance abuse, uncomplicated: Secondary | ICD-10-CM

## 2014-05-22 DIAGNOSIS — R5381 Other malaise: Secondary | ICD-10-CM

## 2014-05-22 DIAGNOSIS — N179 Acute kidney failure, unspecified: Secondary | ICD-10-CM | POA: Diagnosis present

## 2014-05-22 DIAGNOSIS — R112 Nausea with vomiting, unspecified: Secondary | ICD-10-CM | POA: Insufficient documentation

## 2014-05-22 DIAGNOSIS — F17200 Nicotine dependence, unspecified, uncomplicated: Secondary | ICD-10-CM | POA: Insufficient documentation

## 2014-05-22 DIAGNOSIS — E876 Hypokalemia: Secondary | ICD-10-CM | POA: Insufficient documentation

## 2014-05-22 DIAGNOSIS — Z79899 Other long term (current) drug therapy: Secondary | ICD-10-CM | POA: Insufficient documentation

## 2014-05-22 DIAGNOSIS — R110 Nausea: Secondary | ICD-10-CM

## 2014-05-22 DIAGNOSIS — E860 Dehydration: Principal | ICD-10-CM | POA: Insufficient documentation

## 2014-05-22 DIAGNOSIS — R5383 Other fatigue: Secondary | ICD-10-CM

## 2014-05-22 HISTORY — DX: Acute kidney failure, unspecified: N17.9

## 2014-05-22 HISTORY — DX: Gastro-esophageal reflux disease without esophagitis: K21.9

## 2014-05-22 LAB — URINALYSIS, ROUTINE W REFLEX MICROSCOPIC
Glucose, UA: NEGATIVE mg/dL
Hgb urine dipstick: NEGATIVE
Ketones, ur: 15 mg/dL — AB
Leukocytes, UA: NEGATIVE
Nitrite: NEGATIVE
PROTEIN: NEGATIVE mg/dL
Specific Gravity, Urine: 1.018 (ref 1.005–1.030)
Urobilinogen, UA: 1 mg/dL (ref 0.0–1.0)
pH: 5 (ref 5.0–8.0)

## 2014-05-22 LAB — COMPREHENSIVE METABOLIC PANEL
ALT: 15 U/L (ref 0–53)
AST: 12 U/L (ref 0–37)
Albumin: 5.1 g/dL (ref 3.5–5.2)
Alkaline Phosphatase: 70 U/L (ref 39–117)
Anion gap: 23 — ABNORMAL HIGH (ref 5–15)
BILIRUBIN TOTAL: 1.7 mg/dL — AB (ref 0.3–1.2)
BUN: 51 mg/dL — ABNORMAL HIGH (ref 6–23)
CHLORIDE: 87 meq/L — AB (ref 96–112)
CO2: 22 mEq/L (ref 19–32)
Calcium: 9.5 mg/dL (ref 8.4–10.5)
Creatinine, Ser: 2.25 mg/dL — ABNORMAL HIGH (ref 0.50–1.35)
GFR calc Af Amer: 39 mL/min — ABNORMAL LOW (ref >90)
GFR calc non Af Amer: 34 mL/min — ABNORMAL LOW (ref >90)
Glucose, Bld: 129 mg/dL — ABNORMAL HIGH (ref 70–99)
Potassium: 3.5 mEq/L — ABNORMAL LOW (ref 3.7–5.3)
SODIUM: 132 meq/L — AB (ref 137–147)
Total Protein: 8.7 g/dL — ABNORMAL HIGH (ref 6.0–8.3)

## 2014-05-22 LAB — TROPONIN I

## 2014-05-22 LAB — RAPID HIV SCREEN (WH-MAU): SUDS RAPID HIV SCREEN: NONREACTIVE

## 2014-05-22 LAB — RAPID URINE DRUG SCREEN, HOSP PERFORMED
Amphetamines: NOT DETECTED
BARBITURATES: NOT DETECTED
Benzodiazepines: NOT DETECTED
COCAINE: NOT DETECTED
Opiates: NOT DETECTED
Tetrahydrocannabinol: NOT DETECTED

## 2014-05-22 LAB — LIPASE, BLOOD: LIPASE: 27 U/L (ref 11–59)

## 2014-05-22 LAB — CBC WITH DIFFERENTIAL/PLATELET
Basophils Absolute: 0 10*3/uL (ref 0.0–0.1)
Basophils Relative: 0 % (ref 0–1)
EOS ABS: 0 10*3/uL (ref 0.0–0.7)
EOS PCT: 0 % (ref 0–5)
HEMATOCRIT: 54.3 % — AB (ref 39.0–52.0)
Hemoglobin: 19.8 g/dL — ABNORMAL HIGH (ref 13.0–17.0)
LYMPHS ABS: 1.9 10*3/uL (ref 0.7–4.0)
Lymphocytes Relative: 32 % (ref 12–46)
MCH: 30.1 pg (ref 26.0–34.0)
MCHC: 36.5 g/dL — ABNORMAL HIGH (ref 30.0–36.0)
MCV: 82.5 fL (ref 78.0–100.0)
MONO ABS: 0.6 10*3/uL (ref 0.1–1.0)
Monocytes Relative: 9 % (ref 3–12)
Neutro Abs: 3.5 10*3/uL (ref 1.7–7.7)
Neutrophils Relative %: 59 % (ref 43–77)
PLATELETS: 218 10*3/uL (ref 150–400)
RBC: 6.58 MIL/uL — AB (ref 4.22–5.81)
RDW: 13.1 % (ref 11.5–15.5)
WBC: 6 10*3/uL (ref 4.0–10.5)

## 2014-05-22 LAB — MAGNESIUM: MAGNESIUM: 2.7 mg/dL — AB (ref 1.5–2.5)

## 2014-05-22 LAB — HEMOGLOBIN A1C
Hgb A1c MFr Bld: 5.5 % (ref <5.7)
Mean Plasma Glucose: 111 mg/dL (ref <117)

## 2014-05-22 LAB — TSH: TSH: 1.64 u[IU]/mL (ref 0.350–4.500)

## 2014-05-22 MED ORDER — POTASSIUM CHLORIDE CRYS ER 20 MEQ PO TBCR
40.0000 meq | EXTENDED_RELEASE_TABLET | Freq: Once | ORAL | Status: AC
  Administered 2014-05-22: 40 meq via ORAL
  Filled 2014-05-22: qty 2

## 2014-05-22 MED ORDER — PNEUMOCOCCAL VAC POLYVALENT 25 MCG/0.5ML IJ INJ
0.5000 mL | INJECTION | INTRAMUSCULAR | Status: DC
  Filled 2014-05-22: qty 0.5

## 2014-05-22 MED ORDER — SODIUM CHLORIDE 0.9 % IV SOLN
INTRAVENOUS | Status: DC
  Administered 2014-05-22 – 2014-05-23 (×2): via INTRAVENOUS

## 2014-05-22 MED ORDER — PANTOPRAZOLE SODIUM 40 MG PO TBEC
40.0000 mg | DELAYED_RELEASE_TABLET | Freq: Every day | ORAL | Status: DC
  Administered 2014-05-22 – 2014-05-23 (×2): 40 mg via ORAL
  Filled 2014-05-22 (×2): qty 1

## 2014-05-22 MED ORDER — SODIUM CHLORIDE 0.9 % IV BOLUS (SEPSIS)
1000.0000 mL | Freq: Once | INTRAVENOUS | Status: AC
  Administered 2014-05-22: 1000 mL via INTRAVENOUS

## 2014-05-22 MED ORDER — FOLIC ACID 1 MG PO TABS
1.0000 mg | ORAL_TABLET | Freq: Every day | ORAL | Status: DC
  Administered 2014-05-23: 1 mg via ORAL
  Filled 2014-05-22: qty 1

## 2014-05-22 MED ORDER — THIAMINE HCL 100 MG/ML IJ SOLN
100.0000 mg | Freq: Every day | INTRAMUSCULAR | Status: DC
  Filled 2014-05-22: qty 1

## 2014-05-22 MED ORDER — M.V.I. ADULT IV INJ
INJECTION | Freq: Once | INTRAVENOUS | Status: AC
  Administered 2014-05-22: 16:00:00 via INTRAVENOUS
  Filled 2014-05-22: qty 1000

## 2014-05-22 MED ORDER — ADULT MULTIVITAMIN W/MINERALS CH
1.0000 | ORAL_TABLET | Freq: Every day | ORAL | Status: DC
  Administered 2014-05-23: 1 via ORAL
  Filled 2014-05-22: qty 1

## 2014-05-22 MED ORDER — LORAZEPAM 2 MG/ML IJ SOLN
1.0000 mg | Freq: Four times a day (QID) | INTRAMUSCULAR | Status: DC | PRN
Start: 2014-05-22 — End: 2014-05-23

## 2014-05-22 MED ORDER — ONDANSETRON HCL 4 MG PO TABS: 4.0000 mg | ORAL_TABLET | Freq: Four times a day (QID) | ORAL | Status: DC | PRN

## 2014-05-22 MED ORDER — HEPARIN SODIUM (PORCINE) 5000 UNIT/ML IJ SOLN
5000.0000 [IU] | Freq: Three times a day (TID) | INTRAMUSCULAR | Status: DC
  Administered 2014-05-22 – 2014-05-23 (×3): 5000 [IU] via SUBCUTANEOUS
  Filled 2014-05-22 (×6): qty 1

## 2014-05-22 MED ORDER — VITAMIN B-1 100 MG PO TABS
100.0000 mg | ORAL_TABLET | Freq: Every day | ORAL | Status: DC
  Administered 2014-05-23: 100 mg via ORAL
  Filled 2014-05-22: qty 1

## 2014-05-22 MED ORDER — ONDANSETRON HCL 4 MG/2ML IJ SOLN
4.0000 mg | Freq: Once | INTRAMUSCULAR | Status: AC
  Administered 2014-05-22: 4 mg via INTRAVENOUS
  Filled 2014-05-22: qty 2

## 2014-05-22 MED ORDER — LORAZEPAM 1 MG PO TABS: 1.0000 mg | ORAL_TABLET | Freq: Four times a day (QID) | ORAL | Status: DC | PRN

## 2014-05-22 MED ORDER — ONDANSETRON HCL 4 MG/2ML IJ SOLN
4.0000 mg | Freq: Four times a day (QID) | INTRAMUSCULAR | Status: DC | PRN
  Administered 2014-05-22 – 2014-05-23 (×4): 4 mg via INTRAVENOUS
  Filled 2014-05-22 (×4): qty 2

## 2014-05-22 NOTE — ED Notes (Signed)
Pt place in gown and on monitor. Marcie Bal EMT in room

## 2014-05-22 NOTE — ED Provider Notes (Signed)
CSN: [id number]     Arrival date & time 05/22/14  0903 History   First MD Initiated Contact with Patient 05/22/14 0915     Chief Complaint  Patient presents with  . Fatigue     (Consider location/radiation/quality/duration/timing/severity/associated sxs/prior Treatment) HPI Comments: 43 year old male with history of alcohol abuse, marijuana, smoker presents with recurrent nausea and general fatigue. Patient  had vomiting and nausea for approximately one week no vomiting improved however his persistent nausea and decreased ability keep food down however he is tolerating liquids now. Patient recently in the ER for general workup he felt mild improved and GI followup recommended however patient unable to fall GI due to financial reasons. Last year visited case manager/social work assistance helping with finding a place to live and outpatient followup. Patient feels general fatigue, nonfocal nothing improves. No cardiac history or blood clot history. No blood in the stools or diarrhea. Patient denies IV drug abuse or recent new sexual partners.  The history is provided by the patient.    History reviewed. No pertinent past medical history. History reviewed. No pertinent past surgical history. History reviewed. No pertinent family history. History  Substance Use Topics  . Smoking status: Current Every Day Smoker -- 0.50 packs/day for 20 years    Types: Cigarettes  . Smokeless tobacco: Not on file  . Alcohol Use: Yes     Comment: occ    Review of Systems  Constitutional: Positive for appetite change and fatigue. Negative for fever and chills.  HENT: Negative for congestion.   Eyes: Negative for visual disturbance.  Respiratory: Negative for shortness of breath.   Cardiovascular: Negative for chest pain.  Gastrointestinal: Positive for nausea and vomiting. Negative for abdominal pain.  Genitourinary: Negative for dysuria and flank pain.  Musculoskeletal: Negative for back pain, neck pain  and neck stiffness.  Skin: Negative for rash.  Neurological: Positive for light-headedness. Negative for headaches.      Allergies  Review of patient's allergies indicates no known allergies.  Home Medications   Prior to Admission medications   Medication Sig Start Date End Date Taking? Authorizing Provider  esomeprazole (NEXIUM) 40 MG capsule Take 40 mg by mouth daily at 12 noon.   Yes Historical Provider, MD  ondansetron (ZOFRAN) 4 MG tablet Take 4 mg by mouth every 8 (eight) hours as needed for nausea or vomiting.   Yes Historical Provider, MD  promethazine (PHENERGAN) 25 MG tablet Take 25 mg by mouth every 6 (six) hours as needed for nausea or vomiting.   Yes Historical Provider, MD   BP 128/79  Pulse 93  Temp(Src) 98.1 F (36.7 C) (Oral)  Resp 17  SpO2 87% Physical Exam  Nursing note and vitals reviewed. Constitutional: He is oriented to person, place, and time. He appears well-developed and well-nourished.  HENT:  Head: Normocephalic and atraumatic.  Eyes: Conjunctivae are normal. Right eye exhibits no discharge. Left eye exhibits no discharge.  Neck: Normal range of motion. Neck supple. No tracheal deviation present.  Cardiovascular: Normal rate and regular rhythm.   Pulmonary/Chest: Effort normal and breath sounds normal.  Abdominal: Soft. He exhibits no distension. There is no tenderness. There is no guarding.  Musculoskeletal: He exhibits no edema.  Neurological: He is alert and oriented to person, place, and time.  Skin: Skin is warm. No rash noted.  Psychiatric: He has a normal mood and affect.    ED Course  Procedures (including critical care time) Labs Review Labs Reviewed  COMPREHENSIVE METABOLIC PANEL -  Abnormal; Notable for the following:    Sodium 132 (*)    Potassium 3.5 (*)    Chloride 87 (*)    Glucose, Bld 129 (*)    BUN 51 (*)    Creatinine, Ser 2.25 (*)    Total Protein 8.7 (*)    Total Bilirubin 1.7 (*)    GFR calc non Af Amer 34 (*)     GFR calc Af Amer 39 (*)    Anion gap 23 (*)    All other components within normal limits  CBC WITH DIFFERENTIAL - Abnormal; Notable for the following:    RBC 6.58 (*)    Hemoglobin 19.8 (*)    HCT 54.3 (*)    MCHC 36.5 (*)    All other components within normal limits  URINALYSIS, ROUTINE W REFLEX MICROSCOPIC - Abnormal; Notable for the following:    Bilirubin Urine SMALL (*)    Ketones, ur 15 (*)    All other components within normal limits  MAGNESIUM - Abnormal; Notable for the following:    Magnesium 2.7 (*)    All other components within normal limits  TSH  URINE RAPID DRUG SCREEN (HOSP PERFORMED)  RAPID HIV SCREEN ([facility])  TROPONIN I  LIPASE, BLOOD  HEMOGLOBIN A1C    Imaging Review Dg Chest 2 View  05/22/2014   CLINICAL DATA:  Nausea and fatigue.  EXAM: CHEST  2 VIEW  COMPARISON:  02/28/2013.  FINDINGS: The cardiac silhouette, mediastinal and hilar contours are normal. The lungs demonstrate hyperinflation but no acute pulmonary findings. The bony thorax is intact.  IMPRESSION: Mild hyperinflation but no acute pulmonary findings.   Electronically Signed   By: Kalman Jewels M.D.   On: 05/22/2014 10:14     EKG Interpretation None     EKG not in Muse, heart rate 89, right atrial enlargement, incomplete right bundle-branch block, normal QT, sinus. MDM   Final diagnoses:  Acute renal failure, unspecified acute renal failure type  Dehydration  Nausea and vomiting in adult  Hypokalemia   Patient with general fatigue likely secondary to decreased by mouth intake for the past 2 weeks and nausea. Plan for blood work, IV fluids, HIV rapid test, EKG and troponin. Discussed importance of followup with primary Dr. and specialist as previously arranged and discussed.  Patient lab work reviewed consistent with dehydration acute renal failure. With worsening symptoms and recurrent visit to the ER plan for admission, spoke with [facility]ad hospitalist who recommended unassigned and I spoke  with medicine resident for admission.  The patients results and plan were reviewed and discussed.   Any x-rays performed were personally reviewed by myself.   Differential diagnosis were considered with the presenting HPI.  Medications  heparin injection 5,000 Units (5,000 Units Subcutaneous Given 05/22/14 1552)  ondansetron (ZOFRAN) tablet 4 mg (not administered)    Or  ondansetron (ZOFRAN) injection 4 mg (not administered)  LORazepam (ATIVAN) tablet 1 mg (not administered)    Or  LORazepam (ATIVAN) injection 1 mg (not administered)  thiamine (VITAMIN B-1) tablet 100 mg (not administered)    Or  thiamine (B-1) injection 100 mg (not administered)  folic acid (FOLVITE) tablet 1 mg (not administered)  multivitamin with minerals tablet 1 tablet (not administered)  pantoprazole (PROTONIX) EC tablet 40 mg (40 mg Oral Given 05/22/14 1551)  sodium chloride 0.9 % bolus 1,000 mL (0 mLs Intravenous Stopped 05/22/14 1216)  ondansetron (ZOFRAN) injection 4 mg (4 mg Intravenous Given 05/22/14 1020)  sodium chloride 0.9 % bolus  1,000 mL (0 mLs Intravenous Stopped 05/22/14 1114)  potassium chloride SA (K-DUR,KLOR-CON) CR tablet 40 mEq (40 mEq Oral Given 05/22/14 1101)  sodium chloride 0.9 % 1,000 mL with thiamine 837 mg, folic acid 1 mg, multivitamins adult 10 mL infusion ( Intravenous New Bag/Given 05/22/14 1553)  sodium chloride 0.9 % bolus 1,000 mL (1,000 mLs Intravenous New Bag/Given 05/22/14 1446)      Filed Vitals:   05/22/14 1300 05/22/14 1400 05/22/14 1430 05/22/14 1529  BP: 147/88 156/89 140/80 133/85  Pulse: 83 94 76 66  Temp:    98.7 F (37.1 C)  TempSrc:    Oral  Resp: 14 22 11 20   Height:    6\' 3"  (1.905 m)  Weight:    144 lb 2.9 oz (65.4 kg)  SpO2: 94% 97% 95% 100%    Admission/ observation were discussed with the admitting physician, patient and/or family and they are comfortable with the plan.     Mariea Clonts, MD 05/22/14 [phone]

## 2014-05-22 NOTE — ED Notes (Signed)
Pt asked for a urine sample and given urinal. sts "its going to be a while".

## 2014-05-22 NOTE — ED Notes (Signed)
Phlebotomy at the bedside  

## 2014-05-22 NOTE — ED Notes (Signed)
Pt c/o fatigue and nausea. sts he has been seen for the same. Is trying to get into a GI doctor but hasn't been able to yet. Reports that he has medicine he can take at home and he has been taking it and sts it is effective for the nausea but is still feeling fatigued. Denies pain. Nad, skin warm and dry, resp e/u.

## 2014-05-22 NOTE — ED Notes (Signed)
He states his doctor has been treating him for N/V. He is awaiting an orange card to see a GI specialist. He states this week hes "just so tired i can hardly talk, the only thing that helps is a boiling hot bath." He is A&ox4, resp e/u

## 2014-05-22 NOTE — ED Notes (Signed)
Pt returned from radiology.

## 2014-05-22 NOTE — ED Notes (Signed)
Attempted to call report x 1  

## 2014-05-22 NOTE — H&P (Signed)
Date: 05/22/2014               Patient Name:  Henry Kramer MRN: [id number]  DOB: 1971-02-10 Age / Sex: 43 y.o., male   PCP: Lorayne Marek, MD         Medical Service: Internal Medicine Teaching Service         Attending Physician: Dr. Annia Belt, MD    First Contact: Dr. Albin Felling, MD Pager: [phone]  Second Contact: Dr. Clinton Gallant, MD Pager: [phone]       After Hours (After 5p/  First Contact Pager: [phone]  weekends / holidays): Second Contact Pager: [phone]   Chief Complaint:  Fatigue and nausea  History of Present Illness: Henry Kramer is a 43yo man w/ no significant PMHx who presents with fatigue and nausea for one week. Patient reports last week he wasn't able to hold down anything (solids and liquids). This week, in addition to fatigue he has been feeling lightheaded. He had a similar ED presentation on 04/20/14 that resolved within a few hours after he was able to tolerate food. Patient denies any sick contacts. He has not tried using any medications OTC to relieve his symptoms. He denies fever, chills, abdominal pain, vomiting, hematochezia, melena, diarrhea. Patient reports he does use marijuana frequently and it does worsen his symptoms. He also reports alcohol use.     Meds: Current Facility-Administered Medications  Medication Dose Route Frequency Provider Last Rate Last Dose  . [START ON 0/07/7352] folic acid (FOLVITE) tablet 1 mg  1 mg Oral Daily Blain Pais, MD      . heparin injection 5,000 Units  5,000 Units Subcutaneous 3 times per day Blain Pais, MD      . LORazepam (ATIVAN) tablet 1 mg  1 mg Oral Q6H PRN Blain Pais, MD       Or  . LORazepam (ATIVAN) injection 1 mg  1 mg Intravenous Q6H PRN Blain Pais, MD      . Derrill Memo ON 05/23/2014] multivitamin with minerals tablet 1 tablet  1 tablet Oral Daily Blain Pais, MD      . ondansetron [facility]) tablet 4 mg  4 mg Oral Q6H PRN Blain Pais, MD       Or  .  ondansetron (ZOFRAN) injection 4 mg  4 mg Intravenous Q6H PRN Blain Pais, MD      . pantoprazole (PROTONIX) EC tablet 40 mg  40 mg Oral Daily Blain Pais, MD      . sodium chloride 0.9 % 1,000 mL with thiamine 299 mg, folic acid 1 mg, multivitamins adult 10 mL infusion   Intravenous Once Blain Pais, MD      . Derrill Memo ON 05/23/2014] thiamine (VITAMIN B-1) tablet 100 mg  100 mg Oral Daily Blain Pais, MD       Or  . Derrill Memo ON 05/23/2014] thiamine (B-1) injection 100 mg  100 mg Intravenous Daily Blain Pais, MD        Allergies: Allergies as of 05/22/2014  . (No Known Allergies)   History reviewed. No pertinent past medical history. History reviewed. No pertinent past surgical history. History reviewed. No pertinent family history. History   Social History  . Marital Status: Single    Spouse Name: N/A    Number of Children: N/A  . Years of Education: N/A   Occupational History  . Not on file.   Social History Main Topics  . Smoking  status: Current Every Day Smoker -- 0.50 packs/day for 20 years    Types: Cigarettes  . Smokeless tobacco: Not on file  . Alcohol Use: Yes     Comment: occ  . Drug Use: Yes    Special: Marijuana  . Sexual Activity: Not on file   Other Topics Concern  . Not on file   Social History Narrative  . No narrative on file    Review of Systems: General: Denies malaise, weight changes. HEENT: Denies headaches, eye pain, ear pain, sore throat, rhinorrhea. CV: Denies CP, SOB, palpitations, orthopnea.  Pulm: Denies SOB, wheezing, cough. GI: See HPI. Denies hematemesis. GU: Denies dysuria, frequency, hematuria. Musculoskeletal: Denies muscle cramps, leg swelling. Neuro: Denies tingling, numbness, changes in vision.   Physical Exam: Blood pressure 133/85, pulse 66, temperature 98.7 F (37.1 C), temperature source Oral, resp. rate 20, height 6\' 3"  (1.905 m), weight 144 lb 2.9 oz (65.4 kg), SpO2 100.00%. General:  alert, cooperative, laying on stretcher, NAD HEENT: [place]/AT, EOMI, PERRL, sclera anicteric, mucus membranes dry Neck: supple, no JVD CV: RRR, normal S1/S2, no m/g/r Pulm: CTA bilaterally, breaths nonlabored GI: BS+, nondistended, nontender, no hepatosplenomegaly Ext: warm, no edema, moves all  Lab results: Basic Metabolic Panel:  Recent Labs  05/22/14 0928 05/22/14 1043  NA 132*  --   K 3.5*  --   CL 87*  --   CO2 22  --   GLUCOSE 129*  --   BUN 51*  --   CREATININE 2.25*  --   CALCIUM 9.5  --   MG  --  2.7*   Liver Function Tests:  Recent Labs  05/22/14 0928  AST 12  ALT 15  ALKPHOS 70  BILITOT 1.7*  PROT 8.7*  ALBUMIN 5.1    Recent Labs  05/22/14 1400  LIPASE 27   No results found for this basename: AMMONIA,  in the last 72 hours CBC:  Recent Labs  05/22/14 0928  WBC 6.0  NEUTROABS 3.5  HGB 19.8*  HCT 54.3*  MCV 82.5  PLT 218   Cardiac Enzymes:  Recent Labs  05/22/14 1400  TROPONINI <0.30   BNP: No results found for this basename: PROBNP,  in the last 72 hours D-Dimer: No results found for this basename: DDIMER,  in the last 72 hours CBG: No results found for this basename: GLUCAP,  in the last 72 hours Hemoglobin A1C: No results found for this basename: HGBA1C,  in the last 72 hours Fasting Lipid Panel: No results found for this basename: CHOL, HDL, LDLCALC, TRIG, CHOLHDL, LDLDIRECT,  in the last 72 hours Thyroid Function Tests:  Recent Labs  05/22/14 1043  TSH 1.640   Anemia Panel: No results found for this basename: VITAMINB12, FOLATE, FERRITIN, TIBC, IRON, RETICCTPCT,  in the last 72 hours Coagulation: No results found for this basename: LABPROT, INR,  in the last 72 hours Urine Drug Screen: Drugs of Abuse     Component Value Date/Time   LABOPIA NONE DETECTED 05/22/2014 1021   COCAINSCRNUR NONE DETECTED 05/22/2014 1021   LABBENZ NONE DETECTED 05/22/2014 1021   AMPHETMU NONE DETECTED 05/22/2014 1021   THCU NONE DETECTED 05/22/2014  1021   LABBARB NONE DETECTED 05/22/2014 1021    Alcohol Level: No results found for this basename: ETH,  in the last 72 hours Urinalysis:  Recent Labs  05/22/14 [place] 1.018  PHURINE 5.0  [place]  NEGATIVE  UROBILINOGEN 1.0  NITRITE NEGATIVE  LEUKOCYTESUR NEGATIVE     Imaging results:  Dg Chest 2 View  05/22/2014   CLINICAL DATA:  Nausea and fatigue.  EXAM: CHEST  2 VIEW  COMPARISON:  02/28/2013.  FINDINGS: The cardiac silhouette, mediastinal and hilar contours are normal. The lungs demonstrate hyperinflation but no acute pulmonary findings. The bony thorax is intact.  IMPRESSION: Mild hyperinflation but no acute pulmonary findings.   Electronically Signed   By: Kalman Jewels M.D.   On: 05/22/2014 10:14    Other results: EKG: Right atrial enlargement, incomplete right BBB, normal QT, sinus rhythm  Assessment & Plan: Henry Kramer is a 43yo man w/ no significant PMHx who presents with fatigue and nausea for 1 week.   1. Acute Kidney Injury: Most likely 2/2 to dehydration, BUN 51 and Cr 2.25. Pt is also hemoconcentrated (Hbg 19.8/Hct 54.3) which points towards dehydration.  - Continue IVF 127ml/hr - Clear liquid diet  2. Fatigue/Nausea: Pt complains of fatigue/nausea for 1 week. Pt has presented similarly in the past. Etiology could be cannabinoid hyperemesis syndrome (since pt frequently uses marijuana and use seems to worsen his symptoms) vs. Viral infection.   - Zofran 4mg  PRN nausea  3. Polysubstance Abuse: Patient has history of alcohol and marijuana use. Urine drug screen negative. - CIWA protocol - Ordered folic acid, thiamine, multivitamin - Ativan PRN withdrawal symptoms   Dispo: Disposition is deferred at this time, awaiting improvement of current medical problems. Anticipated discharge in approximately 1 day.   The patient does have a current PCP (Lorayne Marek, MD)  and does need an [facility] hospital follow-up appointment after discharge.  The patient does not have transportation limitations that hinder transportation to clinic appointments.  Signed: Albin Felling, MD 05/22/2014, 3:36 PM

## 2014-05-23 LAB — COMPREHENSIVE METABOLIC PANEL
ALBUMIN: 3.5 g/dL (ref 3.5–5.2)
ALT: 8 U/L (ref 0–53)
AST: 10 U/L (ref 0–37)
Alkaline Phosphatase: 46 U/L (ref 39–117)
Anion gap: 13 (ref 5–15)
BUN: 30 mg/dL — ABNORMAL HIGH (ref 6–23)
CALCIUM: 7.9 mg/dL — AB (ref 8.4–10.5)
CHLORIDE: 100 meq/L (ref 96–112)
CO2: 24 mEq/L (ref 19–32)
CREATININE: 1.03 mg/dL (ref 0.50–1.35)
GFR calc Af Amer: >90 mL/min (ref >90)
GFR, EST NON AFRICAN AMERICAN: 87 mL/min — AB (ref >90)
Glucose, Bld: 110 mg/dL — ABNORMAL HIGH (ref 70–99)
Potassium: 3.5 mEq/L — ABNORMAL LOW (ref 3.7–5.3)
SODIUM: 137 meq/L (ref 137–147)
Total Bilirubin: 1.7 mg/dL — ABNORMAL HIGH (ref 0.3–1.2)
Total Protein: 5.9 g/dL — ABNORMAL LOW (ref 6.0–8.3)

## 2014-05-23 LAB — CBC
HCT: 39 % (ref 39.0–52.0)
Hemoglobin: 14.2 g/dL (ref 13.0–17.0)
MCH: 29.9 pg (ref 26.0–34.0)
MCHC: 36.4 g/dL — ABNORMAL HIGH (ref 30.0–36.0)
MCV: 82.1 fL (ref 78.0–100.0)
Platelets: 188 10*3/uL (ref 150–400)
RBC: 4.75 MIL/uL (ref 4.22–5.81)
RDW: 12.9 % (ref 11.5–15.5)
WBC: 4.3 10*3/uL (ref 4.0–10.5)

## 2014-05-23 MED ORDER — PANTOPRAZOLE SODIUM 40 MG PO TBEC: 40.0000 mg | DELAYED_RELEASE_TABLET | Freq: Every day | ORAL | Status: DC

## 2014-05-23 NOTE — H&P (Signed)
See separate note.

## 2014-05-23 NOTE — Progress Notes (Signed)
Subjective: Patient feeling much better today. Nausea and fatigue resolved. Cr back to baseline of 1.03.   Objective: Vital signs in last 24 hours: Filed Vitals:   05/22/14 2109 05/23/14 0444 05/23/14 0900 05/23/14 0950  BP: 112/70 136/86 136/86 127/76  Pulse: 67 55 55 73  Temp: 98.5 F (36.9 C) 97.7 F (36.5 C)  98.5 F (36.9 C)  TempSrc: Oral Oral  Oral  Resp: 18 16  16   Height:      Weight:      SpO2: 97% 100%  100%   Weight change:   Intake/Output Summary (Last 24 hours) at 05/23/14 1700 Last data filed at 05/23/14 0445  Gross per 24 hour  Intake      0 ml  Output   1500 ml  Net  -1500 ml   Physical Exam General: alert, cooperative, laying in bed, NAD  HEENT: [place]/AT, EOMI, PERRL, sclera anicteric, mucus membranes moist  Neck: supple, no JVD  CV: RRR, normal S1/S2, no m/g/r  Pulm: CTA bilaterally, breaths nonlabored  GI: BS+, nondistended, nontender, no hepatosplenomegaly  Ext: warm, no edema, moves all  Lab Results: Basic Metabolic Panel:  Recent Labs  05/22/14 0928 05/22/14 1043 05/23/14 0500  NA 132*  --  137  K 3.5*  --  3.5*  CL 87*  --  100  CO2 22  --  24  GLUCOSE 129*  --  110*  BUN 51*  --  30*  CREATININE 2.25*  --  1.03  CALCIUM 9.5  --  7.9*  MG  --  2.7*  --    Liver Function Tests:  Recent Labs  05/22/14 0928 05/23/14 0500  AST 12 10  ALT 15 8  ALKPHOS 70 46  BILITOT 1.7* 1.7*  PROT 8.7* 5.9*  ALBUMIN 5.1 3.5    Recent Labs  05/22/14 1400  LIPASE 27   No results found for this basename: AMMONIA,  in the last 72 hours CBC:  Recent Labs  05/22/14 0928 05/23/14 0500  WBC 6.0 4.3  NEUTROABS 3.5  --   HGB 19.8* 14.2  HCT 54.3* 39.0  MCV 82.5 82.1  PLT 218 188   Cardiac Enzymes:  Recent Labs  05/22/14 1400  TROPONINI <0.30   BNP: No results found for this basename: PROBNP,  in the last 72 hours D-Dimer: No results found for this basename: DDIMER,  in the last 72 hours CBG: No results found for this  basename: GLUCAP,  in the last 72 hours Hemoglobin A1C:  Recent Labs  05/22/14 1400  HGBA1C 5.5   Fasting Lipid Panel: No results found for this basename: CHOL, HDL, LDLCALC, TRIG, CHOLHDL, LDLDIRECT,  in the last 72 hours Thyroid Function Tests:  Recent Labs  05/22/14 1043  TSH 1.640   Anemia Panel: No results found for this basename: VITAMINB12, FOLATE, FERRITIN, TIBC, IRON, RETICCTPCT,  in the last 72 hours Coagulation: No results found for this basename: LABPROT, INR,  in the last 72 hours Urine Drug Screen: Drugs of Abuse     Component Value Date/Time   LABOPIA NONE DETECTED 05/22/2014 1021   COCAINSCRNUR NONE DETECTED 05/22/2014 1021   LABBENZ NONE DETECTED 05/22/2014 1021   AMPHETMU NONE DETECTED 05/22/2014 1021   THCU NONE DETECTED 05/22/2014 1021   LABBARB NONE DETECTED 05/22/2014 1021    Alcohol Level: No results found for this basename: ETH,  in the last 72 hours Urinalysis:  Recent Labs  05/22/14 1021  COLORURINE YELLOW  LABSPEC 1.018  PHURINE 5.0  GLUCOSEU NEGATIVE  HGBUR NEGATIVE  BILIRUBINUR SMALL*  KETONESUR 15*  PROTEINUR NEGATIVE  UROBILINOGEN 1.0  NITRITE NEGATIVE  LEUKOCYTESUR NEGATIVE     Micro Results: No results found for this or any previous visit (from the past 240 hour(s)). Studies/Results: Dg Chest 2 View  05/22/2014   CLINICAL DATA:  Nausea and fatigue.  EXAM: CHEST  2 VIEW  COMPARISON:  02/28/2013.  FINDINGS: The cardiac silhouette, mediastinal and hilar contours are normal. The lungs demonstrate hyperinflation but no acute pulmonary findings. The bony thorax is intact.  IMPRESSION: Mild hyperinflation but no acute pulmonary findings.   Electronically Signed   By: Kalman Jewels M.D.   On: 05/22/2014 10:14   Medications: I have reviewed the patient's current medications. Scheduled Meds: . folic acid  1 mg Oral Daily  . heparin  5,000 Units Subcutaneous 3 times per day  . multivitamin with minerals  1 tablet Oral Daily  . pantoprazole   40 mg Oral Daily  . pneumococcal 23 valent vaccine  0.5 mL Intramuscular Tomorrow-1000  . thiamine  100 mg Oral Daily   Or  . thiamine  100 mg Intravenous Daily   Continuous Infusions: . sodium chloride 125 mL/hr at 05/23/14 0539   PRN Meds:.LORazepam, LORazepam, ondansetron (ZOFRAN) IV, ondansetron Assessment/Plan: Mr. Knutzen is a 43yo man w/ no significant PMHx who presents with fatigue and nausea for 1 week.   1. Acute Kidney Injury: Most likely 2/2 to dehydration, BUN 51 and Cr 2.25 on admission. Now improved to BUN 30, Cr 1.03.  - Continue IVF 164ml/hr  - Advance diet to solids to see if tolerates PO intake  2. Fatigue/Nausea: Pt complains of fatigue/nausea for 1 week. Pt has presented similarly in the past. Etiology could be cannabinoid hyperemesis syndrome (since pt frequently uses marijuana and use seems to worsen his symptoms) vs. Viral infection vs. Gastritis. Patient states fatigue and nausea have resolved today.   - Zofran 4mg  PRN nausea  - Recommend follow up with outpatient GI per PCP  3. Polysubstance Abuse: Patient has history of alcohol and marijuana use. Urine drug screen negative.  - CIWA protocol  - Continue folic acid, thiamine, multivitamin  - Ativan PRN withdrawal symptoms   Dispo: Patient to be discharged today.   The patient does have a current PCP (Lorayne Marek, MD) and does need an [facility] hospital follow-up appointment after discharge.  The patient does not have transportation limitations that hinder transportation to clinic appointments.  .Services Needed at time of discharge: Y = Yes, Blank = No PT:   OT:   RN:   Equipment:   Other:     LOS: 1 day   Albin Felling, MD 05/23/2014, 5:00 PM

## 2014-05-23 NOTE — Progress Notes (Signed)
Patient ID: Henry Kramer, male   DOB: Nov 08, 1971, 43 y.o.   MRN: [id number] Attending physician admission note: I interviewed and examined this patient. Presenting problems, medical history, physical findings, evaluation and management plan accurate as recorded by  resident physician Dr. Albin Felling. A 43 year old man in overall good health with no major medical or surgical illness, presents with progressive fatigue, nausea, intermittent vomiting. No signs or symptoms of infection. He admits to both alcohol and marijuana use. He has no localizing findings on exam. Laboratory remarkable for significant dehydration and hemoconcentration. He has normal liver functions except for mild elevation of bilirubin at 1.7. With a normal bilirubin recorded on 04/21/2014 when he presented with a similar episode. He appears to have a nonspecific gastritis. He'll be treated with hydration and proton pump inhibitors. Further evaluation as indicated after assessing response to this treatment.

## 2014-05-23 NOTE — Discharge Summary (Signed)
Name: Henry Kramer MRN: [id number] DOB: 1971/05/29 42 y.o. PCP: Lorayne Marek, MD  Date of Admission: 05/22/2014  9:08 AM Date of Discharge: 05/23/2014 Attending Physician: No att. providers found  Discharge Diagnosis: 1. Acute Kidney Injury 2/2 dehydration 2. Nausea/Fatigue 3. Polysubstance Abuse  Discharge Medications:   Medication List    STOP taking these medications       esomeprazole 40 MG capsule  Commonly known as:  [place] these medications       ondansetron 4 MG tablet  Commonly known as:  ZOFRAN  Take 4 mg by mouth every 8 (eight) hours as needed for nausea or vomiting.     pantoprazole 40 MG tablet  Commonly known as:  PROTONIX  Take 1 tablet (40 mg total) by mouth daily.     promethazine 25 MG tablet  Commonly known as:  PHENERGAN  Take 25 mg by mouth every 6 (six) hours as needed for nausea or vomiting.        Disposition and follow-up:   Henry Kramer was discharged from [facility] in Good condition.  At the hospital follow up visit please address:  1.  GI work up for chronic nausea/vomiting/fatigue.   2.  Labs / imaging needed at time of follow-up: None  3.  Pending labs/ test needing follow-up: None  Follow-up Appointments: Follow-up Information   Follow up with Lorayne Marek, MD On 06/09/2014. (12:00pm )    Specialty:  Internal Medicine   Contact information:   [place] [address] [phone]       Discharge Instructions: None Discharge Instructions   Increase activity slowly    Complete by:  As directed            Consultations: None  Procedures Performed:  Dg Chest 2 View  05/22/2014   CLINICAL DATA:  Nausea and fatigue.  EXAM: CHEST  2 VIEW  COMPARISON:  02/28/2013.  FINDINGS: The cardiac silhouette, mediastinal and hilar contours are normal. The lungs demonstrate hyperinflation but no acute pulmonary findings. The bony thorax is intact.  IMPRESSION: Mild  hyperinflation but no acute pulmonary findings.   Electronically Signed   By: Kalman Jewels M.D.   On: 05/22/2014 10:14    Admission HPI: Henry Kramer is a 43yo man w/ no significant PMHx who presents with fatigue and nausea for one week. Patient reports last week he wasn't able to hold down anything (solids and liquids). This week, in addition to fatigue he has been feeling lightheaded. He had a similar ED presentation on 04/20/14 that resolved within a few hours after he was able to tolerate food. Patient denies any sick contacts. He has not tried using any medications OTC to relieve his symptoms. He denies fever, chills, abdominal pain, vomiting, hematochezia, melena, diarrhea. Patient reports he does use marijuana frequently and it does worsen his symptoms. He also reports alcohol use.    Hospital Course by problem list:   1. AKI 2/2 dehydration: Patient presented with Cr 2.25 which is elevated from his baseline of 1.00 and he was hemoconcentrated with Hbg 19.8/Hct 54.3. Pt was given IVF NS @ 159ml/hr. Today patient's Cr returned to his baseline of 1.03. He is feeling much better than yesterday and has good PO intake.   2. Fatigue/Nausea: Pt presented with 1 week history of nausea and fatigue. He has had similar presentations in the past with his last on 04/20/14. Fatigue and nausea likely related  to his marijuana smoking (cannabinoid hyperemesis syndrome) or possibly gastritis or undiagnosed GERD. Patient was started on IVF and given Zofran PRN nausea. Today the patient's nausea and fatigue have resolved. Recommend for him to go to his GI outpatient appointment that was already made by his PCP.   3. Polysubstance Abuse: Patient has hx of alcohol abuse and marijuana use. He admitted to recent use of both substances. His urine drug screen was negative on admission. Patient was placed on CIWA protocol and Ativan was ordered for withdrawal symptoms as needed. Folic acid, thiamine, and a multivitamin were  also ordered.  Discharge Vitals:   BP 127/76  Pulse 73  Temp(Src) 98.5 F (36.9 C) (Oral)  Resp 16  Ht 6\' 3"  (1.905 m)  Wt 144 lb 2.9 oz (65.4 kg)  BMI 18.02 kg/m2  SpO2 100%  Discharge Labs:  Results for orders placed during the hospital encounter of 05/22/14 (from the past 24 hour(s))  COMPREHENSIVE METABOLIC PANEL     Status: Abnormal   Collection Time    05/23/14  5:00 AM      Result Value Ref Range   Sodium 137  137 - 147 mEq/L   Potassium 3.5 (*) 3.7 - 5.3 mEq/L   Chloride 100  96 - 112 mEq/L   CO2 24  19 - 32 mEq/L   Glucose, Bld 110 (*) 70 - 99 mg/dL   BUN 30 (*) 6 - 23 mg/dL   Creatinine, Ser 1.03  0.50 - 1.35 mg/dL   Calcium 7.9 (*) 8.4 - 10.5 mg/dL   Total Protein 5.9 (*) 6.0 - 8.3 g/dL   Albumin 3.5  3.5 - 5.2 g/dL   AST 10  0 - 37 U/L   ALT 8  0 - 53 U/L   Alkaline Phosphatase 46  39 - 117 U/L   Total Bilirubin 1.7 (*) 0.3 - 1.2 mg/dL   GFR calc non Af Amer 87 (*) >90 mL/min   GFR calc Af Amer >90  >90 mL/min   Anion gap 13  5 - 15  CBC     Status: Abnormal   Collection Time    05/23/14  5:00 AM      Result Value Ref Range   WBC 4.3  4.0 - 10.5 K/uL   RBC 4.75  4.22 - 5.81 MIL/uL   Hemoglobin 14.2  13.0 - 17.0 g/dL   HCT 39.0  39.0 - 52.0 %   MCV 82.1  78.0 - 100.0 fL   MCH 29.9  26.0 - 34.0 pg   MCHC 36.4 (*) 30.0 - 36.0 g/dL   RDW 12.9  11.5 - 15.5 %   Platelets 188  150 - 400 K/uL    Signed: Albin Felling, MD 05/23/2014, 2:58 PM    Services Ordered on Discharge: None Equipment Ordered on Discharge: None

## 2014-05-23 NOTE — Progress Notes (Signed)
Pt discharge instructions given, pt verbalized understanding.  VSS. Denies pain. Pleasant. Pt left floor ambulating accompanied by staff and family. 

## 2014-05-23 NOTE — Discharge Summary (Signed)
See separate note.

## 2014-05-30 ENCOUNTER — Encounter: Payer: Self-pay | Admitting: Gastroenterology

## 2014-05-30 ENCOUNTER — Telehealth: Payer: Self-pay | Admitting: Gastroenterology

## 2014-05-30 ENCOUNTER — Ambulatory Visit: Payer: No Typology Code available for payment source | Admitting: Gastroenterology

## 2014-05-30 NOTE — Telephone Encounter (Signed)
MAILED LETTER °

## 2014-05-30 NOTE — Telephone Encounter (Signed)
Pt was a no show

## 2014-06-09 ENCOUNTER — Ambulatory Visit: Payer: No Typology Code available for payment source | Admitting: Internal Medicine

## 2014-09-04 ENCOUNTER — Encounter (HOSPITAL_COMMUNITY): Payer: Self-pay | Admitting: Emergency Medicine

## 2014-09-04 ENCOUNTER — Emergency Department (HOSPITAL_COMMUNITY)
Admission: EM | Admit: 2014-09-04 | Discharge: 2014-09-04 | Disposition: A | Discharge status: Home / Self Care | Payer: No Typology Code available for payment source | Attending: Emergency Medicine | Admitting: Emergency Medicine

## 2014-09-04 DIAGNOSIS — Z79899 Other long term (current) drug therapy: Secondary | ICD-10-CM | POA: Insufficient documentation

## 2014-09-04 DIAGNOSIS — Z720 Tobacco use: Secondary | ICD-10-CM | POA: Diagnosis not present

## 2014-09-04 DIAGNOSIS — E860 Dehydration: Secondary | ICD-10-CM | POA: Diagnosis not present

## 2014-09-04 DIAGNOSIS — K219 Gastro-esophageal reflux disease without esophagitis: Secondary | ICD-10-CM | POA: Insufficient documentation

## 2014-09-04 DIAGNOSIS — Z87448 Personal history of other diseases of urinary system: Secondary | ICD-10-CM | POA: Diagnosis not present

## 2014-09-04 DIAGNOSIS — R112 Nausea with vomiting, unspecified: Secondary | ICD-10-CM | POA: Insufficient documentation

## 2014-09-04 DIAGNOSIS — R011 Cardiac murmur, unspecified: Secondary | ICD-10-CM | POA: Insufficient documentation

## 2014-09-04 LAB — CBC WITH DIFFERENTIAL/PLATELET
BASOS ABS: 0 10*3/uL (ref 0.0–0.1)
Basophils Relative: 0 % (ref 0–1)
EOS ABS: 0 10*3/uL (ref 0.0–0.7)
EOS PCT: 0 % (ref 0–5)
HEMATOCRIT: 56.1 % — AB (ref 39.0–52.0)
HEMOGLOBIN: 20.2 g/dL — AB (ref 13.0–17.0)
Lymphocytes Relative: 37 % (ref 12–46)
Lymphs Abs: 2.1 10*3/uL (ref 0.7–4.0)
MCH: 29.6 pg (ref 26.0–34.0)
MCHC: 36 g/dL (ref 30.0–36.0)
MCV: 82.3 fL (ref 78.0–100.0)
MONO ABS: 0.6 10*3/uL (ref 0.1–1.0)
MONOS PCT: 10 % (ref 3–12)
Neutro Abs: 3.1 10*3/uL (ref 1.7–7.7)
Neutrophils Relative %: 53 % (ref 43–77)
Platelets: 251 10*3/uL (ref 150–400)
RBC: 6.82 MIL/uL — ABNORMAL HIGH (ref 4.22–5.81)
RDW: 13.2 % (ref 11.5–15.5)
WBC: 5.8 10*3/uL (ref 4.0–10.5)

## 2014-09-04 LAB — COMPREHENSIVE METABOLIC PANEL
ALBUMIN: 4.3 g/dL (ref 3.5–5.2)
ALT: 16 U/L (ref 0–53)
ANION GAP: 20 — AB (ref 5–15)
AST: 13 U/L (ref 0–37)
Alkaline Phosphatase: 66 U/L (ref 39–117)
BILIRUBIN TOTAL: 1.1 mg/dL (ref 0.3–1.2)
BUN: 41 mg/dL — AB (ref 6–23)
CALCIUM: 9.2 mg/dL (ref 8.4–10.5)
CHLORIDE: 91 meq/L — AB (ref 96–112)
CO2: 24 mEq/L (ref 19–32)
CREATININE: 2.06 mg/dL — AB (ref 0.50–1.35)
GFR calc Af Amer: 43 mL/min — ABNORMAL LOW (ref >90)
GFR calc non Af Amer: 37 mL/min — ABNORMAL LOW (ref >90)
Glucose, Bld: 121 mg/dL — ABNORMAL HIGH (ref 70–99)
Potassium: 3.6 mEq/L — ABNORMAL LOW (ref 3.7–5.3)
Sodium: 135 mEq/L — ABNORMAL LOW (ref 137–147)
TOTAL PROTEIN: 7.8 g/dL (ref 6.0–8.3)

## 2014-09-04 LAB — BASIC METABOLIC PANEL
ANION GAP: 13 (ref 5–15)
BUN: 36 mg/dL — ABNORMAL HIGH (ref 6–23)
CHLORIDE: 102 meq/L (ref 96–112)
CO2: 23 mEq/L (ref 19–32)
CREATININE: 1.62 mg/dL — AB (ref 0.50–1.35)
Calcium: 7.5 mg/dL — ABNORMAL LOW (ref 8.4–10.5)
GFR calc non Af Amer: 50 mL/min — ABNORMAL LOW (ref >90)
GFR, EST AFRICAN AMERICAN: 58 mL/min — AB (ref >90)
Glucose, Bld: 102 mg/dL — ABNORMAL HIGH (ref 70–99)
Potassium: 3.8 mEq/L (ref 3.7–5.3)
SODIUM: 138 meq/L (ref 137–147)

## 2014-09-04 LAB — LIPASE, BLOOD: LIPASE: 22 U/L (ref 11–59)

## 2014-09-04 MED ORDER — SODIUM CHLORIDE 0.9 % IV BOLUS (SEPSIS)
1000.0000 mL | Freq: Once | INTRAVENOUS | Status: AC
  Administered 2014-09-04: 1000 mL via INTRAVENOUS

## 2014-09-04 MED ORDER — ONDANSETRON 4 MG PO TBDP: 4.0000 mg | ORAL_TABLET | Freq: Three times a day (TID) | ORAL | Status: DC | PRN

## 2014-09-04 MED ORDER — PROMETHAZINE HCL 25 MG/ML IJ SOLN
25.0000 mg | Freq: Once | INTRAMUSCULAR | Status: AC
  Administered 2014-09-04: 25 mg via INTRAVENOUS
  Filled 2014-09-04: qty 1

## 2014-09-04 NOTE — Discharge Instructions (Signed)
Please follow up with your primary care physician in 1 week for repeat blood testing to ensure you kidney function returns to normal. It was mildly elevated at today's visit and was improving after IV fluids, it was likely elevated due to dehydration. Please keep your follow up appointment with your gastroenterologist. If you need a new referral please call Dr. Amedeo Plenty. Please use Zofran as prescribed. Please read all discharge instructions and return precautions.    Nausea and Vomiting Nausea is a sick feeling that often comes before throwing up (vomiting). Vomiting is a reflex where stomach contents come out of your mouth. Vomiting can cause severe loss of body fluids (dehydration). Children and elderly adults can become dehydrated quickly, especially if they also have diarrhea. Nausea and vomiting are symptoms of a condition or disease. It is important to find the cause of your symptoms. CAUSES   Direct irritation of the stomach lining. This irritation can result from increased acid production (gastroesophageal reflux disease), infection, food poisoning, taking certain medicines (such as nonsteroidal anti-inflammatory drugs), alcohol use, or tobacco use.  Signals from the brain.These signals could be caused by a headache, heat exposure, an inner ear disturbance, increased pressure in the brain from injury, infection, a tumor, or a concussion, pain, emotional stimulus, or metabolic problems.  An obstruction in the gastrointestinal tract (bowel obstruction).  Illnesses such as diabetes, hepatitis, gallbladder problems, appendicitis, kidney problems, cancer, sepsis, atypical symptoms of a heart attack, or eating disorders.  Medical treatments such as chemotherapy and radiation.  Receiving medicine that makes you sleep (general anesthetic) during surgery. DIAGNOSIS Your caregiver may ask for tests to be done if the problems do not improve after a few days. Tests may also be done if symptoms are  severe or if the reason for the nausea and vomiting is not clear. Tests may include:  Urine tests.  Blood tests.  Stool tests.  Cultures (to look for evidence of infection).  X-rays or other imaging studies. Test results can help your caregiver make decisions about treatment or the need for additional tests. TREATMENT You need to stay well hydrated. Drink frequently but in small amounts.You may wish to drink water, sports drinks, clear broth, or eat frozen ice pops or gelatin dessert to help stay hydrated.When you eat, eating slowly may help prevent nausea.There are also some antinausea medicines that may help prevent nausea. HOME CARE INSTRUCTIONS   Take all medicine as directed by your caregiver.  If you do not have an appetite, do not force yourself to eat. However, you must continue to drink fluids.  If you have an appetite, eat a normal diet unless your caregiver tells you differently.  Eat a variety of complex carbohydrates (rice, wheat, potatoes, bread), lean meats, yogurt, fruits, and vegetables.  Avoid high-fat foods because they are more difficult to digest.  Drink enough water and fluids to keep your urine clear or pale yellow.  If you are dehydrated, ask your caregiver for specific rehydration instructions. Signs of dehydration may include:  Severe thirst.  Dry lips and mouth.  Dizziness.  Dark urine.  Decreasing urine frequency and amount.  Confusion.  Rapid breathing or pulse. SEEK IMMEDIATE MEDICAL CARE IF:   You have blood or brown flecks (like coffee grounds) in your vomit.  You have black or bloody stools.  You have a severe headache or stiff neck.  You are confused.  You have severe abdominal pain.  You have chest pain or trouble breathing.  You do not urinate  at least once every 8 hours.  You develop cold or clammy skin.  You continue to vomit for longer than 24 to 48 hours.  You have a fever. MAKE SURE YOU:   Understand these  instructions.  Will watch your condition.  Will get help right away if you are not doing well or get worse. Document Released: 11/03/2005 Document Revised: 01/26/2012 Document Reviewed: 04/02/2011 [facility] Patient Information 2015 [place], [place]. This information is not intended to replace advice given to you by your health care provider. Make sure you discuss any questions you have with your health care provider. Dehydration, Adult Dehydration is when you lose more fluids from the body than you take in. Vital organs like the kidneys, brain, and heart cannot function without a proper amount of fluids and salt. Any loss of fluids from the body can cause dehydration.  CAUSES   Vomiting.  Diarrhea.  Excessive sweating.  Excessive urine output.  Fever. SYMPTOMS  Mild dehydration  Thirst.  Dry lips.  Slightly dry mouth. Moderate dehydration  Very dry mouth.  Sunken eyes.  Skin does not bounce back quickly when lightly pinched and released.  Dark urine and decreased urine production.  Decreased tear production.  Headache. Severe dehydration  Very dry mouth.  Extreme thirst.  Rapid, weak pulse (more than 100 beats per minute at rest).  Cold hands and feet.  Not able to sweat in spite of heat and temperature.  Rapid breathing.  Blue lips.  Confusion and lethargy.  Difficulty being awakened.  Minimal urine production.  No tears. DIAGNOSIS  Your caregiver will diagnose dehydration based on your symptoms and your exam. Blood and urine tests will help confirm the diagnosis. The diagnostic evaluation should also identify the cause of dehydration. TREATMENT  Treatment of mild or moderate dehydration can often be done at home by increasing the amount of fluids that you drink. It is best to drink small amounts of fluid more often. Drinking too much at one time can make vomiting worse. Refer to the home care instructions below. Severe dehydration needs to be  treated at the hospital where you will probably be given intravenous (IV) fluids that contain water and electrolytes. HOME CARE INSTRUCTIONS   Ask your caregiver about specific rehydration instructions.  Drink enough fluids to keep your urine clear or pale yellow.  Drink small amounts frequently if you have nausea and vomiting.  Eat as you normally do.  Avoid:  Foods or drinks high in sugar.  Carbonated drinks.  Juice.  Extremely hot or cold fluids.  Drinks with caffeine.  Fatty, greasy foods.  Alcohol.  Tobacco.  Overeating.  Gelatin desserts.  Wash your hands well to avoid spreading bacteria and viruses.  Only take over-the-counter or prescription medicines for pain, discomfort, or fever as directed by your caregiver.  Ask your caregiver if you should continue all prescribed and over-the-counter medicines.  Keep all follow-up appointments with your caregiver. SEEK MEDICAL CARE IF:  You have abdominal pain and it increases or stays in one area (localizes).  You have a rash, stiff neck, or severe headache.  You are irritable, sleepy, or difficult to awaken.  You are weak, dizzy, or extremely thirsty. SEEK IMMEDIATE MEDICAL CARE IF:   You are unable to keep fluids down or you get worse despite treatment.  You have frequent episodes of vomiting or diarrhea.  You have blood or green matter (bile) in your vomit.  You have blood in your stool or your stool looks black  and tarry.  You have not urinated in 6 to 8 hours, or you have only urinated a small amount of very dark urine.  You have a fever.  You faint. MAKE SURE YOU:   Understand these instructions.  Will watch your condition.  Will get help right away if you are not doing well or get worse. Document Released: 11/03/2005 Document Revised: 01/26/2012 Document Reviewed: 06/23/2011 [facility] Patient Information 2015 [place], [place]. This information is not intended to replace advice given to you by  your health care provider. Make sure you discuss any questions you have with your health care provider.

## 2014-09-04 NOTE — ED Notes (Signed)
Pt c/o N/V x 1 week; pt sts hx of same

## 2014-09-04 NOTE — ED Provider Notes (Signed)
  This was a shared visit with a mid-level provided (NP or PA).  Throughout the patient's course I was available for consultation/collaboration.  I saw the ECG (if appropriate), relevant labs and studies - I agree with the interpretation.  On my exam the patient was in no distress. Patient was receiving fluid hydration, with substantial improvement in his nausea, vomiting. Patient's evaluation is most notable for elevated creatinine, consistent with prior episodes. Patient presents here with fluid rehydration, had decreasing creatinine, and resolution of symptoms.  Patient will follow up with primary care for repeat labs check     Carmin Muskrat, MD 09/04/14 [phone]

## 2014-09-04 NOTE — ED Provider Notes (Signed)
CSN: [id number]     Arrival date & time 09/04/14  1030 History   First MD Initiated Contact with Patient 09/04/14 1117     Chief Complaint  Patient presents with  . Emesis     (Consider location/radiation/quality/duration/timing/severity/associated sxs/prior Treatment) HPI Comments: Patient is a 43 year old male past medical history significant for GERD, history of AK I presented to the emergency department for one week of multiple episodes of nausea, nonbloody nonbilious emesis with generalized abdominal pain. No alleviating factors. His symptoms are aggravated with eating or drinking. Patient states this is very similar to his previous visit to the hospital back in July. He was advised to follow up with the gastroenterologist, is scheduled to see them on the 28th. No abdominal surgical histories.  Patient is a 43 y.o. male presenting with vomiting.  Emesis Associated symptoms: abdominal pain     Past Medical History  Diagnosis Date  . Heart murmur   . GERD (gastroesophageal reflux disease)   . AKI (acute kidney injury) 05/22/2014   Past Surgical History  Procedure Laterality Date  . No past surgeries     History reviewed. No pertinent family history. History  Substance Use Topics  . Smoking status: Current Every Day Smoker -- 0.50 packs/day for 22 years    Types: Cigarettes  . Smokeless tobacco: Never Used  . Alcohol Use: 7.8 oz/week    13 Cans of beer per week     Comment: 05/22/2014 "drink 2, 40oz beers/day; 2 days/wk"    Review of Systems  Gastrointestinal: Positive for nausea, vomiting and abdominal pain.  All other systems reviewed and are negative.     Allergies  Review of patient's allergies indicates no known allergies.  Home Medications   Prior to Admission medications   Medication Sig Start Date End Date Taking? Authorizing Provider  esomeprazole (NEXIUM) 40 MG capsule Take 40 mg by mouth daily at 12 noon.   Yes Historical Provider, MD  promethazine  (PHENERGAN) 25 MG tablet Take 25 mg by mouth every 6 (six) hours as needed for nausea or vomiting.   Yes Historical Provider, MD  ondansetron (ZOFRAN ODT) 4 MG disintegrating tablet Take 1 tablet (4 mg total) by mouth every 8 (eight) hours as needed for nausea or vomiting. 09/04/14   Jerica Creegan L Akito Boomhower, PA-C   BP 107/74  Pulse 95  Temp(Src) 99 F (37.2 C) (Oral)  Resp 16  SpO2 93% Physical Exam  Nursing note and vitals reviewed. Constitutional: He is oriented to person, place, and time. He appears well-developed and well-nourished. No distress.  HENT:  Head: Normocephalic and atraumatic.  Right Ear: External ear normal.  Left Ear: External ear normal.  Nose: Nose normal.  Mouth/Throat: Uvula is midline and oropharynx is clear and moist. Mucous membranes are dry.  Eyes: Conjunctivae are normal.  Neck: Normal range of motion. Neck supple.  Cardiovascular: Normal rate, regular rhythm and normal heart sounds.   Pulmonary/Chest: Effort normal and breath sounds normal. No respiratory distress.  Abdominal: Soft. Bowel sounds are normal. He exhibits no distension. There is no tenderness. There is no rebound and no guarding.  Musculoskeletal: Normal range of motion.  Neurological: He is alert and oriented to person, place, and time.  Skin: Skin is warm and dry. He is not diaphoretic.  Psychiatric: He has a normal mood and affect.    ED Course  Procedures (including critical care time) Medications  sodium chloride 0.9 % bolus 1,000 mL (0 mLs Intravenous Stopped 09/04/14 1340)  promethazine (  PHENERGAN) injection 25 mg (25 mg Intravenous Given 09/04/14 1241)  sodium chloride 0.9 % bolus 1,000 mL (0 mLs Intravenous Stopped 09/04/14 1429)  sodium chloride 0.9 % bolus 1,000 mL (0 mLs Intravenous Stopped 09/04/14 1509)    Labs Review Labs Reviewed  CBC WITH DIFFERENTIAL - Abnormal; Notable for the following:    RBC 6.82 (*)    Hemoglobin 20.2 (*)    HCT 56.1 (*)    All other  components within normal limits  COMPREHENSIVE METABOLIC PANEL - Abnormal; Notable for the following:    Sodium 135 (*)    Potassium 3.6 (*)    Chloride 91 (*)    Glucose, Bld 121 (*)    BUN 41 (*)    Creatinine, Ser 2.06 (*)    GFR calc non Af Amer 37 (*)    GFR calc Af Amer 43 (*)    Anion gap 20 (*)    All other components within normal limits  BASIC METABOLIC PANEL - Abnormal; Notable for the following:    Glucose, Bld 102 (*)    BUN 36 (*)    Creatinine, Ser 1.62 (*)    Calcium 7.5 (*)    GFR calc non Af Amer 50 (*)    GFR calc Af Amer 58 (*)    All other components within normal limits  LIPASE, BLOOD    Imaging Review No results found.   EKG Interpretation None      MDM   Final diagnoses:  Nausea and vomiting in adult patient  Dehydration   Filed Vitals:   09/04/14 1630  BP: 107/74  Pulse: 95  Temp:   Resp: 16   Afebrile, NAD, non-toxic appearing, AAOx4.   Patient presenting with one week of nausea, non-bloody non-bilious emesis, with generalized abdominal pain.  Patient's abdomen is soft, non-tender, non-distended. Hgb and Hct elevated. BUN and Creatinine elevated. 3L of IVF, PO liquids, and IV Phenergan given. Symptoms improved. BMP was re-checked with improvement of Bun and Creatinine. Slight elevation likely secondary to dehydration. On repeat examination abdominal exam remains benign. Advised patient follow up with his PCP or GI doctor in 1 week for recheck of BMP to ensure BUN and Creatinine are back to baseline. Will discharge patient home with anti-emetics. Symptomatic measures also discussed. Patient is agreeable to plan. Patient is stable at time of discharge. Patient d/w with Dr. Vanita Panda, agrees with plan.        Harlow Mares, PA-C 09/04/14 1643

## 2014-10-14 ENCOUNTER — Emergency Department (HOSPITAL_COMMUNITY)
Admission: EM | Admit: 2014-10-14 | Discharge: 2014-10-15 | Disposition: A | Discharge status: Home / Self Care | Payer: No Typology Code available for payment source | Attending: Emergency Medicine | Admitting: Emergency Medicine

## 2014-10-14 ENCOUNTER — Encounter (HOSPITAL_COMMUNITY): Payer: Self-pay | Admitting: *Deleted

## 2014-10-14 DIAGNOSIS — R011 Cardiac murmur, unspecified: Secondary | ICD-10-CM | POA: Insufficient documentation

## 2014-10-14 DIAGNOSIS — Z720 Tobacco use: Secondary | ICD-10-CM | POA: Insufficient documentation

## 2014-10-14 DIAGNOSIS — R112 Nausea with vomiting, unspecified: Secondary | ICD-10-CM | POA: Insufficient documentation

## 2014-10-14 DIAGNOSIS — K219 Gastro-esophageal reflux disease without esophagitis: Secondary | ICD-10-CM | POA: Insufficient documentation

## 2014-10-14 DIAGNOSIS — Z79899 Other long term (current) drug therapy: Secondary | ICD-10-CM | POA: Insufficient documentation

## 2014-10-14 DIAGNOSIS — N179 Acute kidney failure, unspecified: Secondary | ICD-10-CM | POA: Insufficient documentation

## 2014-10-14 DIAGNOSIS — E860 Dehydration: Secondary | ICD-10-CM | POA: Insufficient documentation

## 2014-10-14 LAB — CBC WITH DIFFERENTIAL/PLATELET
BASOS ABS: 0 10*3/uL (ref 0.0–0.1)
BASOS PCT: 0 % (ref 0–1)
EOS PCT: 0 % (ref 0–5)
Eosinophils Absolute: 0 10*3/uL (ref 0.0–0.7)
HEMATOCRIT: 54.7 % — AB (ref 39.0–52.0)
Hemoglobin: 19.9 g/dL — ABNORMAL HIGH (ref 13.0–17.0)
Lymphocytes Relative: 24 % (ref 12–46)
Lymphs Abs: 1.8 10*3/uL (ref 0.7–4.0)
MCH: 29.5 pg (ref 26.0–34.0)
MCHC: 36.4 g/dL — ABNORMAL HIGH (ref 30.0–36.0)
MCV: 81 fL (ref 78.0–100.0)
MONO ABS: 0.5 10*3/uL (ref 0.1–1.0)
Monocytes Relative: 7 % (ref 3–12)
NEUTROS ABS: 5.4 10*3/uL (ref 1.7–7.7)
Neutrophils Relative %: 69 % (ref 43–77)
Platelets: 324 10*3/uL (ref 150–400)
RBC: 6.75 MIL/uL — ABNORMAL HIGH (ref 4.22–5.81)
RDW: 14 % (ref 11.5–15.5)
WBC: 7.8 10*3/uL (ref 4.0–10.5)

## 2014-10-14 LAB — COMPREHENSIVE METABOLIC PANEL
ALBUMIN: 4.8 g/dL (ref 3.5–5.2)
ALT: 18 U/L (ref 0–53)
AST: 17 U/L (ref 0–37)
Alkaline Phosphatase: 75 U/L (ref 39–117)
Anion gap: 24 — ABNORMAL HIGH (ref 5–15)
BILIRUBIN TOTAL: 0.7 mg/dL (ref 0.3–1.2)
BUN: 32 mg/dL — ABNORMAL HIGH (ref 6–23)
CHLORIDE: 95 meq/L — AB (ref 96–112)
CO2: 18 mEq/L — ABNORMAL LOW (ref 19–32)
CREATININE: 2.54 mg/dL — AB (ref 0.50–1.35)
Calcium: 9.8 mg/dL (ref 8.4–10.5)
GFR calc Af Amer: 34 mL/min — ABNORMAL LOW (ref >90)
GFR calc non Af Amer: 29 mL/min — ABNORMAL LOW (ref >90)
Glucose, Bld: 150 mg/dL — ABNORMAL HIGH (ref 70–99)
Potassium: 4 mEq/L (ref 3.7–5.3)
SODIUM: 137 meq/L (ref 137–147)
Total Protein: 8.5 g/dL — ABNORMAL HIGH (ref 6.0–8.3)

## 2014-10-14 LAB — LIPASE, BLOOD: LIPASE: 42 U/L (ref 11–59)

## 2014-10-14 MED ORDER — ONDANSETRON HCL 4 MG/2ML IJ SOLN
4.0000 mg | Freq: Once | INTRAMUSCULAR | Status: AC
  Administered 2014-10-14: 4 mg via INTRAVENOUS
  Filled 2014-10-14: qty 2

## 2014-10-14 MED ORDER — SODIUM CHLORIDE 0.9 % IV BOLUS (SEPSIS): 1000.0000 mL | Freq: Once | INTRAVENOUS | Status: DC

## 2014-10-14 MED ORDER — SODIUM CHLORIDE 0.9 % IV BOLUS (SEPSIS)
1000.0000 mL | Freq: Once | INTRAVENOUS | Status: AC
  Administered 2014-10-14: 1000 mL via INTRAVENOUS

## 2014-10-14 MED ORDER — DEXTROSE 5 % IV BOLUS: 500.0000 mL | Freq: Once | INTRAVENOUS | Status: DC

## 2014-10-14 MED ORDER — DEXTROSE 5 % IV BOLUS
1000.0000 mL | Freq: Once | INTRAVENOUS | Status: AC
  Administered 2014-10-14: 1000 mL via INTRAVENOUS

## 2014-10-14 NOTE — ED Notes (Signed)
Patient has not been able to keep any liquids or foods down since Wednesday.

## 2014-10-14 NOTE — ED Provider Notes (Signed)
CSN: [id number]     Arrival date & time 10/14/14  1629 History  This chart was scribed for Ernestina Patches, MD by Rayfield Citizen, ED Scribe. This patient was seen in room D36C/D36C and the patient's care was started at 11:10 PM.    Chief Complaint  Patient presents with  . Nausea   Patient is a 43 y.o. male presenting with vomiting. The history is provided by the patient. No language interpreter was used.  Emesis Severity:  Moderate Duration:  4 days Timing:  Intermittent Quality:  Stomach contents Progression:  Unchanged Chronicity:  Recurrent Relieved by:  None tried Worsened by:  Nothing tried Ineffective treatments:  None tried Associated symptoms: no abdominal pain, no diarrhea and no headaches   Risk factors: no sick contacts      HPI Comments: Henry Kramer is a 43 y.o. male who presents to the Emergency Department complaining of 4 days of nausea and dizziness. He reports vomiting after every meal (after eating or drinking at all); vomiting occurs several moments after eating or drinking, described as yellow or green. He reports subjective fever and chills. He states that his bowel movements are irregular; last BM 4 days PTA. He denies blood in the vomit. He denies abdominal pain, diarrhea, dysuria, pain or swelling in his legs, sick contacts.   He reports prior experience with similar symptoms; at least two similar episodes this year. He notes that he sees a GI across the street from [facility]; he believes he is scheduled for an endoscopy in the near future.   Patient smokes marijuana on occasion; last use 5 days PTA. He denies every day use.   He denies prior surgical history or regular medications. No PCP.   Past Medical History  Diagnosis Date  . Heart murmur   . GERD (gastroesophageal reflux disease)   . AKI (acute kidney injury) 05/22/2014   Past Surgical History  Procedure Laterality Date  . No past surgeries     History reviewed. No pertinent family  history. History  Substance Use Topics  . Smoking status: Current Every Day Smoker -- 0.50 packs/day for 22 years    Types: Cigarettes  . Smokeless tobacco: Never Used  . Alcohol Use: 7.8 oz/week    13 Cans of beer per week     Comment: 05/22/2014 "drink 2, 40oz beers/day; 2 days/wk"    Review of Systems  Constitutional: Negative for fever, activity change, appetite change and fatigue.  HENT: Negative for congestion, facial swelling, rhinorrhea and trouble swallowing.   Eyes: Negative for photophobia and pain.  Respiratory: Negative for cough, chest tightness and shortness of breath.   Cardiovascular: Negative for chest pain and leg swelling.  Gastrointestinal: Positive for nausea and vomiting. Negative for abdominal pain, diarrhea and constipation.  Endocrine: Negative for polydipsia and polyuria.  Genitourinary: Negative for dysuria, urgency, decreased urine volume and difficulty urinating.  Musculoskeletal: Negative for back pain and gait problem.  Skin: Negative for color change, rash and wound.  Allergic/Immunologic: Negative for immunocompromised state.  Neurological: Positive for dizziness. Negative for facial asymmetry, speech difficulty, weakness, numbness and headaches.  Psychiatric/Behavioral: Negative for confusion, decreased concentration and agitation.    Allergies  Review of patient's allergies indicates no known allergies.  Home Medications   Prior to Admission medications   Medication Sig Start Date End Date Taking? Authorizing Provider  esomeprazole (NEXIUM) 40 MG capsule Take 40 mg by mouth daily as needed (acid reflux).    Yes Historical Provider, MD  ondansetron (ZOFRAN ODT) 4 MG disintegrating tablet Take 1 tablet (4 mg total) by mouth every 8 (eight) hours as needed for nausea or vomiting. Patient not taking: Reported on 10/14/2014 09/04/14   Stephani Police Piepenbrink, PA-C  promethazine (PHENERGAN) 25 MG suppository Place 1 suppository (25 mg total) rectally  every 6 (six) hours as needed for nausea or vomiting. 10/15/14   Ernestina Patches, MD   BP 120/79 mmHg  Pulse 69  Temp(Src) 98.5 F (36.9 C) (Oral)  Resp 16  Ht 6\' 3"  (1.905 m)  SpO2 92% Physical Exam  Constitutional: He is oriented to person, place, and time. He appears well-developed and well-nourished. No distress.  Thin  HENT:  Head: Normocephalic and atraumatic.  Mouth/Throat: No oropharyngeal exudate.  Eyes: Pupils are equal, round, and reactive to light.  Neck: Normal range of motion. Neck supple.  Cardiovascular: Normal rate, regular rhythm and normal heart sounds.  Exam reveals no gallop and no friction rub.   No murmur heard. Pulmonary/Chest: Effort normal and breath sounds normal. No respiratory distress. He has no wheezes. He has no rales.  Abdominal: Soft. Bowel sounds are normal. He exhibits no distension and no mass. There is no tenderness. There is no rebound and no guarding.  Musculoskeletal: Normal range of motion. He exhibits no edema or tenderness.  Neurological: He is alert and oriented to person, place, and time.  Skin: Skin is warm and dry.  Psychiatric: He has a normal mood and affect.  Nursing note and vitals reviewed.   ED Course  Procedures   DIAGNOSTIC STUDIES: 2230 Oxygen Saturation is 88% on RA, low by my interpretation.    COORDINATION OF CARE: 11:16 PM Discussed treatment plan with pt at bedside and pt agreed to plan.   Labs Review Labs Reviewed  COMPREHENSIVE METABOLIC PANEL - Abnormal; Notable for the following:    Chloride 95 (*)    CO2 18 (*)    Glucose, Bld 150 (*)    BUN 32 (*)    Creatinine, Ser 2.54 (*)    Total Protein 8.5 (*)    GFR calc non Af Amer 29 (*)    GFR calc Af Amer 34 (*)    Anion gap 24 (*)    All other components within normal limits  CBC WITH DIFFERENTIAL - Abnormal; Notable for the following:    RBC 6.75 (*)    Hemoglobin 19.9 (*)    HCT 54.7 (*)    MCHC 36.4 (*)    All other components within normal limits   URINALYSIS, ROUTINE W REFLEX MICROSCOPIC - Abnormal; Notable for the following:    APPearance CLOUDY (*)    Hgb urine dipstick LARGE (*)    All other components within normal limits  BASIC METABOLIC PANEL - Abnormal; Notable for the following:    Glucose, Bld 105 (*)    BUN 37 (*)    Creatinine, Ser 1.96 (*)    GFR calc non Af Amer 40 (*)    GFR calc Af Amer 46 (*)    Anion gap 19 (*)    All other components within normal limits  URINE MICROSCOPIC-ADD ON - Abnormal; Notable for the following:    Casts HYALINE CASTS (*)    All other components within normal limits  LIPASE, BLOOD    Imaging Review No results found.   EKG Interpretation None      MDM   Final diagnoses:  Non-intractable vomiting with nausea, vomiting of unspecified type  Dehydration  AKI (acute  kidney injury)   .Pt is a 43 y.o. male with Pmhx as above who presents with several days of intractible n/v, and inability to tolerate PO. He smoked marijuana the day before symptoms onset, has had multiple prior similar episodes over the past several years, that has been speculated could be related to marijuana use. Initial labs show ARF with Cr 2.54, hemoconcentration 19.9.  2L NS, 1 L D5W given with improvement of Cr to 1.96 and AG from 24-19, pt's n/v relieved by zofran x2, and phenergan x1. Pt able to tolerate liquids and I believe he can continue PO hydration at home. Return precautions given for new or worsening symptoms including inability to tolerate PO. Pt instructed to abstain from marijuana use.       I personally performed the services described in this documentation, which was scribed in my presence. The recorded information has been reviewed and is accurate.      Ernestina Patches, MD 10/16/14 [phone]

## 2014-10-14 NOTE — ED Notes (Signed)
Pt in c/o nausea and vomiting, states he hasn't been able to keep anything down since Wednesday, history of similar symptoms in the past, denies pain

## 2014-10-15 LAB — BASIC METABOLIC PANEL
Anion gap: 19 — ABNORMAL HIGH (ref 5–15)
BUN: 37 mg/dL — ABNORMAL HIGH (ref 6–23)
CO2: 21 mEq/L (ref 19–32)
Calcium: 8.4 mg/dL (ref 8.4–10.5)
Chloride: 98 mEq/L (ref 96–112)
Creatinine, Ser: 1.96 mg/dL — ABNORMAL HIGH (ref 0.50–1.35)
GFR calc non Af Amer: 40 mL/min — ABNORMAL LOW (ref >90)
GFR, EST AFRICAN AMERICAN: 46 mL/min — AB (ref >90)
Glucose, Bld: 105 mg/dL — ABNORMAL HIGH (ref 70–99)
Potassium: 3.9 mEq/L (ref 3.7–5.3)
Sodium: 138 mEq/L (ref 137–147)

## 2014-10-15 LAB — URINALYSIS, ROUTINE W REFLEX MICROSCOPIC
BILIRUBIN URINE: NEGATIVE
Glucose, UA: NEGATIVE mg/dL
Ketones, ur: NEGATIVE mg/dL
Leukocytes, UA: NEGATIVE
NITRITE: NEGATIVE
Protein, ur: NEGATIVE mg/dL
Specific Gravity, Urine: 1.016 (ref 1.005–1.030)
UROBILINOGEN UA: 0.2 mg/dL (ref 0.0–1.0)
pH: 5 (ref 5.0–8.0)

## 2014-10-15 LAB — URINE MICROSCOPIC-ADD ON

## 2014-10-15 MED ORDER — PROMETHAZINE HCL 25 MG RE SUPP: 25.0000 mg | Freq: Four times a day (QID) | RECTAL | Status: DC | PRN

## 2014-10-15 MED ORDER — PROMETHAZINE HCL 25 MG/ML IJ SOLN
25.0000 mg | Freq: Once | INTRAMUSCULAR | Status: AC
  Administered 2014-10-15: 25 mg via INTRAVENOUS
  Filled 2014-10-15: qty 1

## 2014-10-15 MED ORDER — SODIUM CHLORIDE 0.9 % IV BOLUS (SEPSIS)
1000.0000 mL | Freq: Once | INTRAVENOUS | Status: AC
  Administered 2014-10-15: 1000 mL via INTRAVENOUS

## 2014-10-15 NOTE — Discharge Instructions (Signed)
Dehydration, Adult Dehydration is when you lose more fluids from the body than you take in. Vital organs like the kidneys, brain, and heart cannot function without a proper amount of fluids and salt. Any loss of fluids from the body can cause dehydration.  CAUSES   Vomiting.  Diarrhea.  Excessive sweating.  Excessive urine output.  Fever. SYMPTOMS  Mild dehydration  Thirst.  Dry lips.  Slightly dry mouth. Moderate dehydration  Very dry mouth.  Sunken eyes.  Skin does not bounce back quickly when lightly pinched and released.  Dark urine and decreased urine production.  Decreased tear production.  Headache. Severe dehydration  Very dry mouth.  Extreme thirst.  Rapid, weak pulse (more than 100 beats per minute at rest).  Cold hands and feet.  Not able to sweat in spite of heat and temperature.  Rapid breathing.  Blue lips.  Confusion and lethargy.  Difficulty being awakened.  Minimal urine production.  No tears. DIAGNOSIS  Your caregiver will diagnose dehydration based on your symptoms and your exam. Blood and urine tests will help confirm the diagnosis. The diagnostic evaluation should also identify the cause of dehydration. TREATMENT  Treatment of mild or moderate dehydration can often be done at home by increasing the amount of fluids that you drink. It is best to drink small amounts of fluid more often. Drinking too much at one time can make vomiting worse. Refer to the home care instructions below. Severe dehydration needs to be treated at the hospital where you will probably be given intravenous (IV) fluids that contain water and electrolytes. HOME CARE INSTRUCTIONS   Ask your caregiver about specific rehydration instructions.  Drink enough fluids to keep your urine clear or pale yellow.  Drink small amounts frequently if you have nausea and vomiting.  Eat as you normally do.  Avoid:  Foods or drinks high in sugar.  Carbonated  drinks.  Juice.  Extremely hot or cold fluids.  Drinks with caffeine.  Fatty, greasy foods.  Alcohol.  Tobacco.  Overeating.  Gelatin desserts.  Wash your hands well to avoid spreading bacteria and viruses.  Only take over-the-counter or prescription medicines for pain, discomfort, or fever as directed by your caregiver.  Ask your caregiver if you should continue all prescribed and over-the-counter medicines.  Keep all follow-up appointments with your caregiver. SEEK MEDICAL CARE IF:  You have abdominal pain and it increases or stays in one area (localizes).  You have a rash, stiff neck, or severe headache.  You are irritable, sleepy, or difficult to awaken.  You are weak, dizzy, or extremely thirsty. SEEK IMMEDIATE MEDICAL CARE IF:   You are unable to keep fluids down or you get worse despite treatment.  You have frequent episodes of vomiting or diarrhea.  You have blood or green matter (bile) in your vomit.  You have blood in your stool or your stool looks black and tarry.  You have not urinated in 6 to 8 hours, or you have only urinated a small amount of very dark urine.  You have a fever.  You faint. MAKE SURE YOU:   Understand these instructions.  Will watch your condition.  Will get help right away if you are not doing well or get worse. Document Released: 11/03/2005 Document Revised: 01/26/2012 Document Reviewed: 06/23/2011 Exit[employer] Patient Information 2015 [employer]. This information is not intended to replace advice given to you by your health care provider. Make sure you discuss any questions you have with your health care   provider.  

## 2014-10-18 ENCOUNTER — Encounter (HOSPITAL_COMMUNITY): Payer: Self-pay | Admitting: Adult Health

## 2014-10-18 ENCOUNTER — Emergency Department (HOSPITAL_COMMUNITY)
Admission: EM | Admit: 2014-10-18 | Discharge: 2014-10-19 | Disposition: A | Discharge status: Home / Self Care | Payer: No Typology Code available for payment source | Attending: Emergency Medicine | Admitting: Emergency Medicine

## 2014-10-18 DIAGNOSIS — Z87448 Personal history of other diseases of urinary system: Secondary | ICD-10-CM | POA: Insufficient documentation

## 2014-10-18 DIAGNOSIS — R112 Nausea with vomiting, unspecified: Secondary | ICD-10-CM | POA: Diagnosis not present

## 2014-10-18 DIAGNOSIS — R011 Cardiac murmur, unspecified: Secondary | ICD-10-CM | POA: Diagnosis not present

## 2014-10-18 DIAGNOSIS — R42 Dizziness and giddiness: Secondary | ICD-10-CM | POA: Diagnosis not present

## 2014-10-18 DIAGNOSIS — Z720 Tobacco use: Secondary | ICD-10-CM | POA: Diagnosis not present

## 2014-10-18 DIAGNOSIS — R6883 Chills (without fever): Secondary | ICD-10-CM | POA: Diagnosis not present

## 2014-10-18 DIAGNOSIS — K219 Gastro-esophageal reflux disease without esophagitis: Secondary | ICD-10-CM | POA: Diagnosis not present

## 2014-10-18 DIAGNOSIS — R0602 Shortness of breath: Secondary | ICD-10-CM | POA: Diagnosis not present

## 2014-10-18 LAB — COMPREHENSIVE METABOLIC PANEL
ALK PHOS: 52 U/L (ref 39–117)
ALT: 12 U/L (ref 0–53)
ANION GAP: 16 — AB (ref 5–15)
AST: 10 U/L (ref 0–37)
Albumin: 3.9 g/dL (ref 3.5–5.2)
BILIRUBIN TOTAL: 1.1 mg/dL (ref 0.3–1.2)
BUN: 33 mg/dL — AB (ref 6–23)
CO2: 26 meq/L (ref 19–32)
Calcium: 9 mg/dL (ref 8.4–10.5)
Chloride: 91 mEq/L — ABNORMAL LOW (ref 96–112)
Creatinine, Ser: 1.55 mg/dL — ABNORMAL HIGH (ref 0.50–1.35)
GFR, EST AFRICAN AMERICAN: 61 mL/min — AB (ref >90)
GFR, EST NON AFRICAN AMERICAN: 53 mL/min — AB (ref >90)
GLUCOSE: 146 mg/dL — AB (ref 70–99)
POTASSIUM: 3.3 meq/L — AB (ref 3.7–5.3)
Sodium: 133 mEq/L — ABNORMAL LOW (ref 137–147)
TOTAL PROTEIN: 6.9 g/dL (ref 6.0–8.3)

## 2014-10-18 LAB — CBC WITH DIFFERENTIAL/PLATELET
Basophils Absolute: 0 10*3/uL (ref 0.0–0.1)
Basophils Relative: 0 % (ref 0–1)
EOS ABS: 0 10*3/uL (ref 0.0–0.7)
Eosinophils Relative: 0 % (ref 0–5)
HCT: 51.2 % (ref 39.0–52.0)
HEMOGLOBIN: 18.5 g/dL — AB (ref 13.0–17.0)
LYMPHS ABS: 1.7 10*3/uL (ref 0.7–4.0)
LYMPHS PCT: 29 % (ref 12–46)
MCH: 29.2 pg (ref 26.0–34.0)
MCHC: 36.1 g/dL — ABNORMAL HIGH (ref 30.0–36.0)
MCV: 80.9 fL (ref 78.0–100.0)
MONOS PCT: 11 % (ref 3–12)
Monocytes Absolute: 0.6 10*3/uL (ref 0.1–1.0)
NEUTROS PCT: 60 % (ref 43–77)
Neutro Abs: 3.6 10*3/uL (ref 1.7–7.7)
Platelets: 240 10*3/uL (ref 150–400)
RBC: 6.33 MIL/uL — ABNORMAL HIGH (ref 4.22–5.81)
RDW: 13.2 % (ref 11.5–15.5)
WBC: 6 10*3/uL (ref 4.0–10.5)

## 2014-10-18 LAB — I-STAT TROPONIN, ED: Troponin i, poc: 0 ng/mL (ref 0.00–0.08)

## 2014-10-18 LAB — LIPASE, BLOOD: Lipase: 51 U/L (ref 11–59)

## 2014-10-18 MED ORDER — ONDANSETRON HCL 4 MG/2ML IJ SOLN
4.0000 mg | Freq: Once | INTRAMUSCULAR | Status: AC
  Administered 2014-10-19: 4 mg via INTRAVENOUS
  Filled 2014-10-18: qty 2

## 2014-10-18 MED ORDER — SODIUM CHLORIDE 0.9 % IV BOLUS (SEPSIS)
2000.0000 mL | Freq: Once | INTRAVENOUS | Status: AC
  Administered 2014-10-19: 2000 mL via INTRAVENOUS

## 2014-10-18 MED ORDER — ONDANSETRON 4 MG PO TBDP
8.0000 mg | ORAL_TABLET | Freq: Once | ORAL | Status: AC
  Administered 2014-10-18: 8 mg via ORAL
  Filled 2014-10-18: qty 2

## 2014-10-18 NOTE — ED Provider Notes (Signed)
CSN: [id number]     Arrival date & time 10/18/14  2055 History  This chart was scribed for Julianne Rice, MD by Delphia Grates, ED Scribe. This patient was seen in room A04C/A04C and the patient's care was started at 11:27 PM.     Chief Complaint  Patient presents with  . Nausea  . Shortness of Breath    The history is provided by the patient. No language interpreter was used.     HPI Comments: Henry Kramer is a 43 y.o. male who presents to the Emergency Department complaining of severe nausea for the past 6 days. Patient was seen here 4 days ago for similar complaint stating his symptoms have not resolved. He states he has an appointment with a gastroenterologist on October 25, 2014. There is associated vomiting (greenish liquid), fatigue, SOB, lightheadedness when ambulating, chills, and weight loss over an unknown period of time (from 165-140; 6'3" tall). Patient reports history of 3 prior episodes since June 2015. Due to his symptoms, patient states that he has not been able to get his prescription filled (Zofran and phenergan) from his last visit here. He reports taking an unspecified white pill for his symptoms that he has been using since June 2015. He states he is not able tolerate food or liquids, and is unable to stand and ambulate without becoming nauseous. He states the smell of food can also trigger nausea. Patient reports a very active lifestyle, but states he is unable to ambulate short distances without experiencing SOB. He reports family history of cancer, stating that his sister and mother were both diagnosed with breast cancer. He is unsure of history of stomach cancer stating that he grew up in foster care and is not certain of his complete family medical history. He denies bloody stools or fever. Patient is a current smoker, but reports he has not been smoking since onset of symptoms.      Past Medical History  Diagnosis Date  . Heart murmur   . GERD  (gastroesophageal reflux disease)   . AKI (acute kidney injury) 05/22/2014   Past Surgical History  Procedure Laterality Date  . No past surgeries     History reviewed. No pertinent family history. History  Substance Use Topics  . Smoking status: Current Every Day Smoker -- 0.50 packs/day for 22 years    Types: Cigarettes  . Smokeless tobacco: Never Used  . Alcohol Use: 7.8 oz/week    13 Cans of beer per week     Comment: 05/22/2014 "drink 2, 40oz beers/day; 2 days/wk"    Review of Systems  Constitutional: Positive for chills. Negative for fever.  Respiratory: Positive for shortness of breath. Negative for cough.   Cardiovascular: Negative for chest pain, palpitations and leg swelling.  Gastrointestinal: Positive for nausea and vomiting. Negative for abdominal pain, diarrhea, constipation and blood in stool.  Musculoskeletal: Negative for back pain, neck pain and neck stiffness.  Skin: Negative for rash and wound.  Neurological: Positive for dizziness and light-headedness. Negative for syncope, weakness, numbness and headaches.  All other systems reviewed and are negative.     Allergies  Review of patient's allergies indicates no known allergies.  Home Medications   Prior to Admission medications   Medication Sig Start Date End Date Taking? Authorizing Provider  esomeprazole (NEXIUM) 40 MG capsule Take 40 mg by mouth daily as needed (acid reflux).    Yes Historical Provider, MD  ondansetron (ZOFRAN ODT) 4 MG disintegrating tablet Take 1 tablet (4  mg total) by mouth every 8 (eight) hours as needed for nausea or vomiting. Patient not taking: Reported on 10/14/2014 09/04/14   Stephani Police Piepenbrink, PA-C  promethazine (PHENERGAN) 25 MG suppository Place 1 suppository (25 mg total) rectally every 6 (six) hours as needed for nausea or vomiting. 10/15/14   Ernestina Patches, MD   Triage Vitals: BP 112/80 mmHg  Pulse 98  Temp(Src) 97.9 F (36.6 C)  Resp 16  SpO2 98%  Physical Exam   Constitutional: He is oriented to person, place, and time. He appears well-developed and well-nourished. No distress.  HENT:  Head: Normocephalic and atraumatic.  Mouth/Throat: Oropharynx is clear and moist. No oropharyngeal exudate.  Eyes: EOM are normal. Pupils are equal, round, and reactive to light.  Neck: Normal range of motion. Neck supple.  Cardiovascular: Normal rate and regular rhythm.   Pulmonary/Chest: Effort normal and breath sounds normal. No respiratory distress. He has no wheezes. He has no rales. He exhibits no tenderness.  Abdominal: Soft. Bowel sounds are normal. He exhibits no distension and no mass. There is no tenderness. There is no rebound and no guarding.  Musculoskeletal: Normal range of motion. He exhibits no edema or tenderness.  No calf swelling or tenderness. No pitting edema.  Neurological: He is alert and oriented to person, place, and time.  Patient is alert and oriented x3 with clear, goal oriented speech. Patient has 5/5 motor in all extremities. Sensation is intact to light touch.   Skin: Skin is warm and dry. No rash noted. No erythema.  Psychiatric: He has a normal mood and affect. His behavior is normal.  Nursing note and vitals reviewed.   ED Course  Procedures (including critical care time)  DIAGNOSTIC STUDIES: Oxygen Saturation is 98% on room air, normal by my interpretation.    COORDINATION OF CARE: At 2336 Discussed treatment plan with patient. Patient agrees.   Labs Review Labs Reviewed  CBC WITH DIFFERENTIAL - Abnormal; Notable for the following:    RBC 6.33 (*)    Hemoglobin 18.5 (*)    MCHC 36.1 (*)    All other components within normal limits  COMPREHENSIVE METABOLIC PANEL - Abnormal; Notable for the following:    Sodium 133 (*)    Potassium 3.3 (*)    Chloride 91 (*)    Glucose, Bld 146 (*)    BUN 33 (*)    Creatinine, Ser 1.55 (*)    GFR calc non Af Amer 53 (*)    GFR calc Af Amer 61 (*)    Anion gap 16 (*)    All other  components within normal limits  LIPASE, BLOOD  PRO B NATRIURETIC PEPTIDE  URINALYSIS, ROUTINE W REFLEX MICROSCOPIC  I-STAT TROPOININ, ED    Imaging Review Dg Abd Acute W/chest  10/19/2014   CLINICAL DATA:  Acute onset of nausea and vomiting. Shortness of breath. Initial encounter.  EXAM: ACUTE ABDOMEN SERIES (ABDOMEN 2 VIEW & CHEST 1 VIEW)  COMPARISON:  Chest radiograph performed 05/22/2014, and abdominal radiograph performed 02/28/2013  FINDINGS: The lungs are well-aerated and clear. There is no evidence of focal opacification, pleural effusion or pneumothorax. The cardiomediastinal silhouette is within normal limits.  The visualized bowel gas pattern is unremarkable. Scattered stool and air are seen within the colon; there is no evidence of small bowel dilatation to suggest obstruction. No free intra-abdominal air is identified on the provided upright view.  No acute osseous abnormalities are seen; the sacroiliac joints are unremarkable in appearance.  IMPRESSION: 1.  Unremarkable bowel gas pattern; no free intra-abdominal air seen. 2. No acute cardiopulmonary process identified.   Electronically Signed   By: Garald Balding M.D.   On: 10/19/2014 01:15     EKG Interpretation None      Date: 10/19/2014  Rate: 101  Rhythm: sinus tachycardia  QRS Axis: normal  Intervals: normal  ST/T Wave abnormalities: nonspecific T wave changes  Conduction Disutrbances:right bundle branch block  Narrative Interpretation:   Old EKG Reviewed: unchanged   MDM   Final diagnoses:  Shortness of breath  Non-intractable vomiting with nausea, vomiting of unspecified type    I personally performed the services described in this documentation, which was scribed in my presence. The recorded information has been reviewed and is accurate.   Patient's nausea and vomiting has resolved. He's had no vomiting in the emergency department. His feeling better after 2 L of normal saline. Bowel sounds remained stable.  He is tolerating fluids by mouth. He has an appointment to follow-up with gastroenterologist. Using curse to keep this. Return precautions have been given and the patient has voiced understanding.  Julianne Rice, MD 10/19/14 [phone]

## 2014-10-18 NOTE — ED Notes (Addendum)
Presents with Nausea, weakness, SOB and inability to hold liquids down. Began before thanksgiving. VSS, pt has half drunk  gatorade. He was seen here 2 -3 days aog for same. Denies pain

## 2014-10-19 ENCOUNTER — Emergency Department (HOSPITAL_COMMUNITY): Payer: No Typology Code available for payment source

## 2014-10-19 LAB — URINALYSIS, ROUTINE W REFLEX MICROSCOPIC
BILIRUBIN URINE: NEGATIVE
GLUCOSE, UA: NEGATIVE mg/dL
Ketones, ur: 15 mg/dL — AB
Leukocytes, UA: NEGATIVE
Nitrite: NEGATIVE
PH: 6 (ref 5.0–8.0)
Protein, ur: NEGATIVE mg/dL
Specific Gravity, Urine: 1.016 (ref 1.005–1.030)
Urobilinogen, UA: 1 mg/dL (ref 0.0–1.0)

## 2014-10-19 LAB — URINE MICROSCOPIC-ADD ON

## 2014-10-19 LAB — PRO B NATRIURETIC PEPTIDE: Pro B Natriuretic peptide (BNP): 44.8 pg/mL (ref 0–125)

## 2014-10-19 NOTE — Discharge Instructions (Signed)

## 2014-10-19 NOTE — ED Notes (Signed)
Educated patient on importanct of follow up with gi and his primary due to altered labs and ongoing n/v. He is to call today to see if there is a chance he can be seen earlier.  Encouraged fluids and rest.  Educated on reflux as well.

## 2014-10-24 ENCOUNTER — Other Ambulatory Visit: Payer: Self-pay | Admitting: Gastroenterology

## 2014-10-24 NOTE — Progress Notes (Signed)
Several unsuccessful attempts were made to contact pt; lvm for pt to arrive at 7:30 AM ( check in at Admitting and tell clerk that he needs to go to Endoscopy for procedure).

## 2014-10-25 ENCOUNTER — Encounter (HOSPITAL_COMMUNITY): Payer: Self-pay | Admitting: Certified Registered Nurse Anesthetist

## 2014-10-25 ENCOUNTER — Encounter (HOSPITAL_COMMUNITY): Admission: RE | Payer: Self-pay | Source: Ambulatory Visit

## 2014-10-25 ENCOUNTER — Ambulatory Visit (HOSPITAL_COMMUNITY)
Admission: RE | Admit: 2014-10-25 | Payer: No Typology Code available for payment source | Source: Ambulatory Visit | Admitting: Gastroenterology

## 2014-10-25 SURGERY — ESOPHAGOGASTRODUODENOSCOPY (EGD) WITH PROPOFOL
Anesthesia: Monitor Anesthesia Care

## 2014-10-25 NOTE — Addendum Note (Signed)
Addended by: Wilford Corner on: 10/25/2014 08:21 AM   Modules accepted: Orders

## 2015-01-15 ENCOUNTER — Encounter (HOSPITAL_COMMUNITY): Payer: Self-pay | Admitting: Emergency Medicine

## 2015-01-15 ENCOUNTER — Emergency Department (HOSPITAL_COMMUNITY)
Admission: EM | Admit: 2015-01-15 | Discharge: 2015-01-15 | Disposition: A | Discharge status: Home / Self Care | Payer: No Typology Code available for payment source | Attending: Emergency Medicine | Admitting: Emergency Medicine

## 2015-01-15 DIAGNOSIS — R011 Cardiac murmur, unspecified: Secondary | ICD-10-CM | POA: Diagnosis not present

## 2015-01-15 DIAGNOSIS — Z87448 Personal history of other diseases of urinary system: Secondary | ICD-10-CM | POA: Insufficient documentation

## 2015-01-15 DIAGNOSIS — K219 Gastro-esophageal reflux disease without esophagitis: Secondary | ICD-10-CM | POA: Insufficient documentation

## 2015-01-15 DIAGNOSIS — R112 Nausea with vomiting, unspecified: Secondary | ICD-10-CM | POA: Insufficient documentation

## 2015-01-15 DIAGNOSIS — Z720 Tobacco use: Secondary | ICD-10-CM | POA: Diagnosis not present

## 2015-01-15 DIAGNOSIS — R6883 Chills (without fever): Secondary | ICD-10-CM | POA: Insufficient documentation

## 2015-01-15 LAB — COMPREHENSIVE METABOLIC PANEL
ALK PHOS: 55 U/L (ref 39–117)
ALT: 16 U/L (ref 0–53)
ANION GAP: 10 (ref 5–15)
AST: 18 U/L (ref 0–37)
Albumin: 4.8 g/dL (ref 3.5–5.2)
BILIRUBIN TOTAL: 0.7 mg/dL (ref 0.3–1.2)
BUN: 20 mg/dL (ref 6–23)
CHLORIDE: 109 mmol/L (ref 96–112)
CO2: 25 mmol/L (ref 19–32)
CREATININE: 1.16 mg/dL (ref 0.50–1.35)
Calcium: 9.3 mg/dL (ref 8.4–10.5)
GFR calc Af Amer: 87 mL/min — ABNORMAL LOW (ref >90)
GFR calc non Af Amer: 75 mL/min — ABNORMAL LOW (ref >90)
GLUCOSE: 145 mg/dL — AB (ref 70–99)
Potassium: 4 mmol/L (ref 3.5–5.1)
Sodium: 144 mmol/L (ref 135–145)
TOTAL PROTEIN: 7.8 g/dL (ref 6.0–8.3)

## 2015-01-15 LAB — CBC WITH DIFFERENTIAL/PLATELET
Basophils Absolute: 0 10*3/uL (ref 0.0–0.1)
Basophils Relative: 0 % (ref 0–1)
Eosinophils Absolute: 0 10*3/uL (ref 0.0–0.7)
Eosinophils Relative: 0 % (ref 0–5)
HCT: 49.6 % (ref 39.0–52.0)
Hemoglobin: 17.2 g/dL — ABNORMAL HIGH (ref 13.0–17.0)
LYMPHS ABS: 0.5 10*3/uL — AB (ref 0.7–4.0)
Lymphocytes Relative: 5 % — ABNORMAL LOW (ref 12–46)
MCH: 29.3 pg (ref 26.0–34.0)
MCHC: 34.7 g/dL (ref 30.0–36.0)
MCV: 84.4 fL (ref 78.0–100.0)
Monocytes Absolute: 0.1 10*3/uL (ref 0.1–1.0)
Monocytes Relative: 1 % — ABNORMAL LOW (ref 3–12)
NEUTROS ABS: 11.2 10*3/uL — AB (ref 1.7–7.7)
Neutrophils Relative %: 94 % — ABNORMAL HIGH (ref 43–77)
Platelets: 243 10*3/uL (ref 150–400)
RBC: 5.88 MIL/uL — AB (ref 4.22–5.81)
RDW: 13.9 % (ref 11.5–15.5)
WBC: 11.8 10*3/uL — AB (ref 4.0–10.5)

## 2015-01-15 LAB — URINALYSIS, ROUTINE W REFLEX MICROSCOPIC
Bilirubin Urine: NEGATIVE
GLUCOSE, UA: NEGATIVE mg/dL
Hgb urine dipstick: NEGATIVE
Ketones, ur: 15 mg/dL — AB
LEUKOCYTES UA: NEGATIVE
NITRITE: NEGATIVE
Protein, ur: NEGATIVE mg/dL
Specific Gravity, Urine: 1.018 (ref 1.005–1.030)
Urobilinogen, UA: 0.2 mg/dL (ref 0.0–1.0)
pH: 5 (ref 5.0–8.0)

## 2015-01-15 LAB — LIPASE, BLOOD: Lipase: 20 U/L (ref 11–59)

## 2015-01-15 MED ORDER — PROMETHAZINE HCL 25 MG/ML IJ SOLN
25.0000 mg | Freq: Once | INTRAMUSCULAR | Status: AC
  Administered 2015-01-15: 25 mg via INTRAVENOUS
  Filled 2015-01-15: qty 1

## 2015-01-15 MED ORDER — PROMETHAZINE HCL 25 MG PO TABS: 25.0000 mg | ORAL_TABLET | Freq: Four times a day (QID) | ORAL | Status: DC | PRN

## 2015-01-15 MED ORDER — SODIUM CHLORIDE 0.9 % IV BOLUS (SEPSIS)
1000.0000 mL | Freq: Once | INTRAVENOUS | Status: AC
  Administered 2015-01-15: 1000 mL via INTRAVENOUS

## 2015-01-15 MED ORDER — ONDANSETRON HCL 4 MG/2ML IJ SOLN
4.0000 mg | Freq: Once | INTRAMUSCULAR | Status: AC
  Administered 2015-01-15: 4 mg via INTRAVENOUS
  Filled 2015-01-15: qty 2

## 2015-01-15 MED ORDER — GI COCKTAIL ~~LOC~~
30.0000 mL | Freq: Once | ORAL | Status: AC
  Administered 2015-01-15: 30 mL via ORAL
  Filled 2015-01-15: qty 30

## 2015-01-15 NOTE — ED Notes (Signed)
Patient was told again that we need a urine sample. If patient can not give sample we will consider cath. RN, DR aware.

## 2015-01-15 NOTE — ED Provider Notes (Signed)
CSN: [id number]     Arrival date & time 01/15/15  1409 History   First MD Initiated Contact with Patient 01/15/15 1414     Chief Complaint  Patient presents with  . Emesis   Henry Kramer is a 44 y.o. male with a history of GERD, AKI and chronic nausea who presents to the ED complaining of worsening nausea with vomiting and chills for the past 3 days. Patient reports he has chronic nausea and is due to have an EGD by [facility] next month. He reports having worsening nausea associated with vomiting and chills since Sunday. He reports eating and drinking makes his nausea worse. He reports vomiting 4 times a day mostly due to making himself vomit due to the nausea. Reports lots of retching. Patient reports he occasionally drinks alcohol, and last drank alcohol one week ago. He denies abdominal pain. He reports having multiple episodes previously and that Zofran usually helps. Patient has taken nothing for treatment today. Patient reports he has a prescription for Zofran but he is unable to afford it. Patient reports he recently started a new job one week ago. Patient denies fevers, hematemesis, diarrhea, hematochezia, abdominal pain, dysuria, hematuria, rashes or sick contacts.   (Consider location/radiation/quality/duration/timing/severity/associated sxs/prior Treatment) HPI  Past Medical History  Diagnosis Date  . Heart murmur   . GERD (gastroesophageal reflux disease)   . AKI (acute kidney injury) 05/22/2014   Past Surgical History  Procedure Laterality Date  . No past surgeries     History reviewed. No pertinent family history. History  Substance Use Topics  . Smoking status: Current Every Day Smoker -- 0.50 packs/day for 22 years    Types: Cigarettes  . Smokeless tobacco: Never Used  . Alcohol Use: 7.8 oz/week    13 Cans of beer per week     Comment: 05/22/2014 "drink 2, 40oz beers/day; 2 days/wk"    Review of Systems  Constitutional: Positive for chills. Negative for fever.   HENT: Negative for congestion and sore throat.   Eyes: Negative for visual disturbance.  Respiratory: Negative for cough, shortness of breath and wheezing.   Cardiovascular: Negative for chest pain and palpitations.  Gastrointestinal: Positive for nausea and vomiting. Negative for abdominal pain, diarrhea, constipation and blood in stool.  Genitourinary: Negative for dysuria, urgency, frequency, hematuria and difficulty urinating.  Musculoskeletal: Negative for back pain and neck pain.  Skin: Negative for rash.  Neurological: Negative for dizziness, weakness, light-headedness and headaches.      Allergies  Review of patient's allergies indicates no known allergies.  Home Medications   Prior to Admission medications   Medication Sig Start Date End Date Taking? Authorizing Provider  esomeprazole (NEXIUM) 40 MG capsule Take 40 mg by mouth daily as needed (acid reflux).    Yes Historical Provider, MD  promethazine (PHENERGAN) 25 MG tablet Take 1 tablet (25 mg total) by mouth every 6 (six) hours as needed for nausea or vomiting. 01/15/15   Verda Cumins Karmyn Lowman, PA-C   BP 138/74 mmHg  Pulse 70  Temp(Src) 99.4 F (37.4 C) (Oral)  Resp 18  SpO2 100% Physical Exam  Constitutional: He appears well-developed and well-nourished. No distress.  HENT:  Head: Normocephalic and atraumatic.  Mouth/Throat: Oropharynx is clear and moist. No oropharyngeal exudate.  Eyes: Conjunctivae are normal. Pupils are equal, round, and reactive to light. Right eye exhibits no discharge. Left eye exhibits no discharge.  Neck: Neck supple.  Cardiovascular: Normal rate, regular rhythm, normal heart sounds and intact distal  pulses.  Exam reveals no gallop and no friction rub.   No murmur heard. Pulmonary/Chest: Effort normal and breath sounds normal. No respiratory distress. He has no wheezes. He has no rales.  Abdominal: Soft. Bowel sounds are normal. He exhibits no distension and no mass. There is no  tenderness. There is no rebound and no guarding.  Abdomen is soft and nontender palpation. Bowel sounds are present.  Musculoskeletal: He exhibits no edema.  Lymphadenopathy:    He has no cervical adenopathy.  Neurological: He is alert. Coordination normal.  Skin: Skin is warm and dry. No rash noted. He is not diaphoretic. No erythema. No pallor.  Psychiatric: He has a normal mood and affect. His behavior is normal.  Nursing note and vitals reviewed.   ED Course  Procedures (including critical care time) Labs Review Labs Reviewed  CBC WITH DIFFERENTIAL/PLATELET - Abnormal; Notable for the following:    WBC 11.8 (*)    RBC 5.88 (*)    Hemoglobin 17.2 (*)    Neutrophils Relative % 94 (*)    Neutro Abs 11.2 (*)    Lymphocytes Relative 5 (*)    Lymphs Abs 0.5 (*)    Monocytes Relative 1 (*)    All other components within normal limits  COMPREHENSIVE METABOLIC PANEL - Abnormal; Notable for the following:    Glucose, Bld 145 (*)    GFR calc non Af Amer 75 (*)    GFR calc Af Amer 87 (*)    All other components within normal limits  URINALYSIS, ROUTINE W REFLEX MICROSCOPIC - Abnormal; Notable for the following:    Ketones, ur 15 (*)    All other components within normal limits  LIPASE, BLOOD    Imaging Review No results found.   EKG Interpretation None      Filed Vitals:   01/15/15 1800 01/15/15 1900 01/15/15 1936 01/15/15 2000  BP: 126/69 134/67  138/74  Pulse: 76 68  70  Temp:   99.4 F (37.4 C)   TempSrc:   Oral   Resp:      SpO2: 100% 100%  100%     MDM   Meds given in ED:  Medications  ondansetron (ZOFRAN) injection 4 mg (4 mg Intravenous Given 01/15/15 1519)  sodium chloride 0.9 % bolus 1,000 mL (0 mLs Intravenous Stopped 01/15/15 1730)  gi cocktail (Maalox,Lidocaine,Donnatal) (30 mLs Oral Given 01/15/15 1815)  promethazine (PHENERGAN) injection 25 mg (25 mg Intravenous Given 01/15/15 1729)  sodium chloride 0.9 % bolus 1,000 mL (0 mLs Intravenous Stopped  01/15/15 1915)    Discharge Medication List as of 01/15/2015  8:25 PM    START taking these medications   Details  promethazine (PHENERGAN) 25 MG tablet Take 1 tablet (25 mg total) by mouth every 6 (six) hours as needed for nausea or vomiting., Starting 01/15/2015, Until Discontinued, Print        Final diagnoses:  Non-intractable vomiting with nausea, vomiting of unspecified type   This is a 44 y.o. male with a history of GERD, AKI and chronic nausea who presents to the ED complaining of worsening nausea with vomiting and chills for the past 3 days. Patient reports he has chronic nausea and is due to have an EGD by [facility] next month. He reports having worsening nausea associated with vomiting and chills since Sunday. Patient is afebrile and nontoxic appearing. Patient's abdomen is soft and nontender to palpation. Urinalysis is negative for infection. Patient is a very mild cytosis at 11.8. CMP  is unremarkable. After fluid bolus and nausea medication the patient is able tolerate by mouth. Patient reports he has no nausea medicine at home and would like Phenergan by mouth for at home. We'll discharge with Phenergan. I advised him to follow-up with his primary care provider as well as his gastroenterologist this week. I advised the patient to follow-up with their primary care provider this week. I advised the patient to return to the emergency department with new or worsening symptoms or new concerns. The patient verbalized understanding and agreement with plan.   This patient was discussed with Dr. Betsey Holiday who agrees with assessment and plan.     Hanley Hays, PA-C 01/16/15 6203  Orpah Greek, MD 01/16/15 2220

## 2015-01-15 NOTE — ED Notes (Addendum)
Awake. Verbally responsive. Resp even and unlabored. No audible adventitious breath sounds noted. ABC's intact. Abd soft/nondistended but tender to palpate. BS (+) and active x4 quadrants. No N/V/D reported. IV saline lock patent and intact. 

## 2015-01-15 NOTE — ED Notes (Signed)
Patient was made aware that we need a urine sample. Patient stated that he would call when ready.

## 2015-01-15 NOTE — ED Notes (Signed)
Pt aware urine sample needed 

## 2015-01-15 NOTE — ED Notes (Signed)
Patient still can not give urine sample.

## 2015-01-15 NOTE — ED Notes (Signed)
MD at bedside. 

## 2015-01-15 NOTE — ED Notes (Signed)
Awake. Verbally responsive. A/O x4. Resp even and unlabored. No audible adventitious breath sounds noted. ABC's intact.  

## 2015-01-15 NOTE — ED Notes (Signed)
Patient states that he can not hold still due to nausea.

## 2015-01-15 NOTE — ED Notes (Signed)
Pt given water to drink for po challenge. Will monitor for n/v.

## 2015-01-15 NOTE — ED Notes (Signed)
Pt had no n/v after po water intake.

## 2015-01-15 NOTE — ED Notes (Signed)
Bed: WA02 Expected date:  Expected time:  Means of arrival:  Comments: EMS 

## 2015-01-15 NOTE — Discharge Instructions (Signed)

## 2015-01-15 NOTE — ED Notes (Signed)
Per EMS pt n/v and chills since Saturday. Pt given 4 mg zofran IV en route. Pt cool to touch.

## 2015-03-12 ENCOUNTER — Other Ambulatory Visit: Payer: Self-pay | Admitting: Gastroenterology

## 2015-03-13 NOTE — Progress Notes (Signed)
Several unsuccessful attempts were made to contact pt including emergency contact number which is currently not in service.Spoke with Janett Billow, receptionist, at MD's office ( surgical scheduler was in with a pt ) to get an alternate number to contact pt; no alternate number is available. Will continue to call.

## 2015-03-13 NOTE — Progress Notes (Signed)
Spoke with Reuben Likes, receptionist  at Dr. Kathline Magic office to make MD aware that several unsuccessful attempts have been made to contact pt. ( mailbox full and emergency contact  # is disconnected).  Another nurse will continue to call pt at number listed until end of shift.

## 2015-03-13 NOTE — Addendum Note (Signed)
Addended by: Wilford Corner on: 03/13/2015 10:48 AM   Modules accepted: Orders

## 2015-03-13 NOTE — Progress Notes (Signed)
Spoke with Henry Kramer, in endoscopy,  to make aware that several unsuccessful attempts were made to contact pt.

## 2015-03-14 ENCOUNTER — Encounter (HOSPITAL_COMMUNITY): Payer: Self-pay | Admitting: Anesthesiology

## 2015-03-14 ENCOUNTER — Ambulatory Visit (HOSPITAL_COMMUNITY)
Admission: RE | Admit: 2015-03-14 | Payer: No Typology Code available for payment source | Source: Ambulatory Visit | Admitting: Gastroenterology

## 2015-03-14 ENCOUNTER — Encounter (HOSPITAL_COMMUNITY): Admission: RE | Payer: Self-pay | Source: Ambulatory Visit

## 2015-03-14 SURGERY — ESOPHAGOGASTRODUODENOSCOPY (EGD) WITH PROPOFOL
Anesthesia: Monitor Anesthesia Care

## 2015-10-28 ENCOUNTER — Encounter (HOSPITAL_COMMUNITY): Payer: Self-pay

## 2015-10-28 ENCOUNTER — Observation Stay (HOSPITAL_COMMUNITY)
Admission: EM | Admit: 2015-10-28 | Discharge: 2015-10-30 | Discharge status: Against Medical Advice | Payer: No Typology Code available for payment source | Attending: Internal Medicine | Admitting: Internal Medicine

## 2015-10-28 DIAGNOSIS — D473 Essential (hemorrhagic) thrombocythemia: Secondary | ICD-10-CM | POA: Diagnosis not present

## 2015-10-28 DIAGNOSIS — E43 Unspecified severe protein-calorie malnutrition: Secondary | ICD-10-CM | POA: Insufficient documentation

## 2015-10-28 DIAGNOSIS — Z5321 Procedure and treatment not carried out due to patient leaving prior to being seen by health care provider: Secondary | ICD-10-CM | POA: Diagnosis not present

## 2015-10-28 DIAGNOSIS — F1911 Other psychoactive substance abuse, in remission: Secondary | ICD-10-CM

## 2015-10-28 DIAGNOSIS — R112 Nausea with vomiting, unspecified: Secondary | ICD-10-CM | POA: Diagnosis present

## 2015-10-28 DIAGNOSIS — R1115 Cyclical vomiting syndrome unrelated to migraine: Secondary | ICD-10-CM

## 2015-10-28 DIAGNOSIS — N179 Acute kidney failure, unspecified: Principal | ICD-10-CM | POA: Diagnosis present

## 2015-10-28 DIAGNOSIS — K219 Gastro-esophageal reflux disease without esophagitis: Secondary | ICD-10-CM | POA: Insufficient documentation

## 2015-10-28 DIAGNOSIS — Z681 Body mass index (BMI) 19 or less, adult: Secondary | ICD-10-CM | POA: Insufficient documentation

## 2015-10-28 DIAGNOSIS — G43A1 Cyclical vomiting, intractable: Secondary | ICD-10-CM | POA: Diagnosis not present

## 2015-10-28 DIAGNOSIS — Z87898 Personal history of other specified conditions: Secondary | ICD-10-CM | POA: Diagnosis not present

## 2015-10-28 DIAGNOSIS — F17210 Nicotine dependence, cigarettes, uncomplicated: Secondary | ICD-10-CM | POA: Insufficient documentation

## 2015-10-28 DIAGNOSIS — R110 Nausea: Secondary | ICD-10-CM | POA: Diagnosis not present

## 2015-10-28 LAB — LIPASE, BLOOD: Lipase: 38 U/L (ref 11–51)

## 2015-10-28 LAB — COMPREHENSIVE METABOLIC PANEL
ALT: 16 U/L — AB (ref 17–63)
AST: 19 U/L (ref 15–41)
Albumin: 5.3 g/dL — ABNORMAL HIGH (ref 3.5–5.0)
Alkaline Phosphatase: 53 U/L (ref 38–126)
Anion gap: 15 (ref 5–15)
BUN: 43 mg/dL — AB (ref 6–20)
CO2: 22 mmol/L (ref 22–32)
CREATININE: 1.69 mg/dL — AB (ref 0.61–1.24)
Calcium: 9.5 mg/dL (ref 8.9–10.3)
Chloride: 101 mmol/L (ref 101–111)
GFR calc Af Amer: 55 mL/min — ABNORMAL LOW (ref >60)
GFR, EST NON AFRICAN AMERICAN: 47 mL/min — AB (ref >60)
Glucose, Bld: 120 mg/dL — ABNORMAL HIGH (ref 65–99)
Potassium: 3.5 mmol/L (ref 3.5–5.1)
SODIUM: 138 mmol/L (ref 135–145)
Total Bilirubin: 1 mg/dL (ref 0.3–1.2)
Total Protein: 8.5 g/dL — ABNORMAL HIGH (ref 6.5–8.1)

## 2015-10-28 LAB — URINE MICROSCOPIC-ADD ON
BACTERIA UA: NONE SEEN
Squamous Epithelial / LPF: NONE SEEN
WBC, UA: NONE SEEN WBC/hpf (ref 0–5)

## 2015-10-28 LAB — CBC WITH DIFFERENTIAL/PLATELET
Basophils Absolute: 0 10*3/uL (ref 0.0–0.1)
Basophils Relative: 0 %
EOS PCT: 0 %
Eosinophils Absolute: 0 10*3/uL (ref 0.0–0.7)
HCT: 51.6 % (ref 39.0–52.0)
Hemoglobin: 18.4 g/dL — ABNORMAL HIGH (ref 13.0–17.0)
LYMPHS ABS: 1.6 10*3/uL (ref 0.7–4.0)
Lymphocytes Relative: 20 %
MCH: 29.4 pg (ref 26.0–34.0)
MCHC: 35.7 g/dL (ref 30.0–36.0)
MCV: 82.4 fL (ref 78.0–100.0)
MONOS PCT: 11 %
Monocytes Absolute: 0.9 10*3/uL (ref 0.1–1.0)
Neutro Abs: 5.5 10*3/uL (ref 1.7–7.7)
Neutrophils Relative %: 69 %
PLATELETS: 275 10*3/uL (ref 150–400)
RBC: 6.26 MIL/uL — ABNORMAL HIGH (ref 4.22–5.81)
RDW: 13.3 % (ref 11.5–15.5)
WBC: 8 10*3/uL (ref 4.0–10.5)

## 2015-10-28 LAB — URINALYSIS, ROUTINE W REFLEX MICROSCOPIC
Bilirubin Urine: NEGATIVE
GLUCOSE, UA: NEGATIVE mg/dL
Ketones, ur: NEGATIVE mg/dL
Leukocytes, UA: NEGATIVE
Nitrite: NEGATIVE
PROTEIN: NEGATIVE mg/dL
Specific Gravity, Urine: 1.018 (ref 1.005–1.030)
pH: 5 (ref 5.0–8.0)

## 2015-10-28 LAB — ETHANOL: Alcohol, Ethyl (B): <5 mg/dL (ref <5)

## 2015-10-28 LAB — RAPID URINE DRUG SCREEN, HOSP PERFORMED
AMPHETAMINES: NOT DETECTED
BARBITURATES: NOT DETECTED
Benzodiazepines: NOT DETECTED
Cocaine: NOT DETECTED
OPIATES: NOT DETECTED
TETRAHYDROCANNABINOL: POSITIVE — AB

## 2015-10-28 LAB — PROTIME-INR
INR: 1.11 (ref 0.00–1.49)
PROTHROMBIN TIME: 14.5 s (ref 11.6–15.2)

## 2015-10-28 LAB — RAPID HIV SCREEN (HIV 1/2 AB+AG)
HIV 1/2 Antibodies: NONREACTIVE
HIV-1 P24 Antigen - HIV24: NONREACTIVE

## 2015-10-28 LAB — TSH: TSH: 0.358 u[IU]/mL (ref 0.350–4.500)

## 2015-10-28 MED ORDER — PROMETHAZINE HCL 25 MG/ML IJ SOLN
12.5000 mg | Freq: Four times a day (QID) | INTRAMUSCULAR | Status: DC | PRN
  Administered 2015-10-29: 12.5 mg via INTRAVENOUS
  Filled 2015-10-28: qty 1

## 2015-10-28 MED ORDER — ENOXAPARIN SODIUM 40 MG/0.4ML ~~LOC~~ SOLN
40.0000 mg | SUBCUTANEOUS | Status: DC
  Administered 2015-10-28 – 2015-10-29 (×2): 40 mg via SUBCUTANEOUS
  Filled 2015-10-28 (×3): qty 0.4

## 2015-10-28 MED ORDER — ACETAMINOPHEN 650 MG RE SUPP: 650.0000 mg | Freq: Four times a day (QID) | RECTAL | Status: DC | PRN

## 2015-10-28 MED ORDER — SODIUM CHLORIDE 0.9 % IV BOLUS (SEPSIS)
1000.0000 mL | Freq: Once | INTRAVENOUS | Status: AC
  Administered 2015-10-28: 1000 mL via INTRAVENOUS

## 2015-10-28 MED ORDER — LORAZEPAM 2 MG/ML IJ SOLN
1.0000 mg | Freq: Once | INTRAMUSCULAR | Status: AC
  Administered 2015-10-28: 1 mg via INTRAVENOUS
  Filled 2015-10-28: qty 1

## 2015-10-28 MED ORDER — ENSURE ENLIVE PO LIQD
237.0000 mL | Freq: Two times a day (BID) | ORAL | Status: DC
  Administered 2015-10-29 – 2015-10-30 (×2): 237 mL via ORAL

## 2015-10-28 MED ORDER — ONDANSETRON HCL 4 MG PO TABS: 4.0000 mg | ORAL_TABLET | Freq: Four times a day (QID) | ORAL | Status: DC | PRN

## 2015-10-28 MED ORDER — ONDANSETRON 4 MG PO TBDP
4.0000 mg | ORAL_TABLET | Freq: Once | ORAL | Status: AC | PRN
  Administered 2015-10-28: 4 mg via ORAL
  Filled 2015-10-28: qty 1

## 2015-10-28 MED ORDER — PANTOPRAZOLE SODIUM 40 MG PO TBEC
40.0000 mg | DELAYED_RELEASE_TABLET | Freq: Two times a day (BID) | ORAL | Status: DC
  Administered 2015-10-28 – 2015-10-30 (×4): 40 mg via ORAL
  Filled 2015-10-28 (×5): qty 1

## 2015-10-28 MED ORDER — ONDANSETRON HCL 4 MG/2ML IJ SOLN
4.0000 mg | Freq: Four times a day (QID) | INTRAMUSCULAR | Status: DC | PRN
  Administered 2015-10-28 – 2015-10-30 (×5): 4 mg via INTRAVENOUS
  Filled 2015-10-28 (×5): qty 2

## 2015-10-28 MED ORDER — ONDANSETRON HCL 4 MG/2ML IJ SOLN
4.0000 mg | Freq: Once | INTRAMUSCULAR | Status: AC
  Administered 2015-10-28: 4 mg via INTRAVENOUS
  Filled 2015-10-28: qty 2

## 2015-10-28 MED ORDER — ACETAMINOPHEN 325 MG PO TABS: 650.0000 mg | ORAL_TABLET | Freq: Four times a day (QID) | ORAL | Status: DC | PRN

## 2015-10-28 MED ORDER — PROMETHAZINE HCL 25 MG/ML IJ SOLN
25.0000 mg | Freq: Once | INTRAMUSCULAR | Status: AC
  Administered 2015-10-28: 25 mg via INTRAVENOUS
  Filled 2015-10-28: qty 1

## 2015-10-28 MED ORDER — KCL IN DEXTROSE-NACL 20-5-0.9 MEQ/L-%-% IV SOLN
INTRAVENOUS | Status: DC
  Administered 2015-10-28: 125 mL/h via INTRAVENOUS
  Administered 2015-10-29 – 2015-10-30 (×2): via INTRAVENOUS
  Filled 2015-10-28 (×9): qty 1000

## 2015-10-28 MED ORDER — SUCRALFATE 1 G PO TABS
1.0000 g | ORAL_TABLET | Freq: Three times a day (TID) | ORAL | Status: DC
  Administered 2015-10-28 – 2015-10-29 (×3): 1 g via ORAL
  Filled 2015-10-28 (×7): qty 1

## 2015-10-28 MED ORDER — SODIUM CHLORIDE 0.9 % IV SOLN: INTRAVENOUS | Status: DC

## 2015-10-28 NOTE — ED Provider Notes (Signed)
CSN: [id number]     Arrival date & time 10/28/15  1032 History   First MD Initiated Contact with Patient 10/28/15 1142     Chief Complaint  Patient presents with  . Nausea     (Consider location/radiation/quality/duration/timing/severity/associated sxs/prior Treatment) HPI  44 year old male with a history of chronic nausea and vomiting presents with worsening symptoms. The last one week he has had uncontrolled vomiting. Has not had any fluids or food in the last 4 days. He is chronically nauseated but states that the vomiting is uncontrollable despite using Zofran at home. Denies fevers or back pain. No chest pain. His abdomen gets tight due to the vomiting but he states it does not hurt. His urine has been darker than normal but no dysuria. He has had many exacerbations similar to this and states that it is otherwise unchanged except uncontrollable. He states he last used marijuana one week ago but does not think this is contracted being. He denies alcohol use although his chart indicates he drinks about 2 beers per day.  Past Medical History  Diagnosis Date  . Heart murmur   . GERD (gastroesophageal reflux disease)   . AKI (acute kidney injury) ([place]) 05/22/2014   Past Surgical History  Procedure Laterality Date  . No past surgeries     No family history on file. Social History  Substance Use Topics  . Smoking status: Current Every Day Smoker -- 0.50 packs/day for 22 years    Types: Cigarettes  . Smokeless tobacco: Never Used  . Alcohol Use: 7.8 oz/week    13 Cans of beer per week     Comment: 05/22/2014 "drink 2, 40oz beers/day; 2 days/wk"    Review of Systems  Constitutional: Negative for fever.  Respiratory: Negative for shortness of breath.   Cardiovascular: Negative for chest pain.  Gastrointestinal: Positive for nausea, vomiting and constipation (no bowel movement in 4 days.). Negative for abdominal pain and diarrhea.  Genitourinary: Negative for dysuria.   Musculoskeletal: Negative for back pain.  All other systems reviewed and are negative.     Allergies  Review of patient's allergies indicates no known allergies.  Home Medications   Prior to Admission medications   Medication Sig Start Date End Date Taking? Authorizing Provider  esomeprazole (NEXIUM) 40 MG capsule Take 40 mg by mouth daily as needed (acid reflux).     Historical Provider, MD  promethazine (PHENERGAN) 25 MG tablet Take 1 tablet (25 mg total) by mouth every 6 (six) hours as needed for nausea or vomiting. 01/15/15   Waynetta Pean, PA-C   BP 116/85 mmHg  Pulse 99  Temp(Src) 98 F (36.7 C) (Oral)  Resp 18  SpO2 99% Physical Exam  Constitutional: He is oriented to person, place, and time. He appears well-developed and well-nourished.  Patient is actively vomiting. Sitting on the edge of the bed leaning over the emesis bag.  HENT:  Head: Normocephalic and atraumatic.  Right Ear: External ear normal.  Left Ear: External ear normal.  Nose: Nose normal.  Eyes: Right eye exhibits no discharge. Left eye exhibits no discharge.  Neck: Neck supple.  Cardiovascular: Normal rate, regular rhythm, normal heart sounds and intact distal pulses.   Pulmonary/Chest: Effort normal and breath sounds normal.  Abdominal: Soft. He exhibits no distension. There is no tenderness.  Musculoskeletal: He exhibits no edema.  Neurological: He is alert and oriented to person, place, and time.  Skin: Skin is warm and dry.  Nursing note and vitals reviewed.  ED Course  Procedures (including critical care time) Labs Review Labs Reviewed  COMPREHENSIVE METABOLIC PANEL - Abnormal; Notable for the following:    Glucose, Bld 120 (*)    BUN 43 (*)    Creatinine, Ser 1.69 (*)    Total Protein 8.5 (*)    Albumin 5.3 (*)    ALT 16 (*)    GFR calc non Af Amer 47 (*)    GFR calc Af Amer 55 (*)    All other components within normal limits  CBC WITH DIFFERENTIAL/PLATELET - Abnormal; Notable for  the following:    RBC 6.26 (*)    Hemoglobin 18.4 (*)    All other components within normal limits  URINALYSIS, ROUTINE W REFLEX MICROSCOPIC (NOT AT [facility]) - Abnormal; Notable for the following:    APPearance CLOUDY (*)    Hgb urine dipstick TRACE (*)    All other components within normal limits  URINE MICROSCOPIC-ADD ON - Abnormal; Notable for the following:    Casts HYALINE CASTS (*)    All other components within normal limits  URINE RAPID DRUG SCREEN, HOSP PERFORMED - Abnormal; Notable for the following:    Tetrahydrocannabinol POSITIVE (*)    All other components within normal limits  LIPASE, BLOOD  ETHANOL  PROTIME-INR  TSH  HEMOGLOBIN A1C  HEPATITIS C ANTIBODY  RAPID HIV SCREEN (HIV 1/2 AB+AG)    Imaging Review No results found. I have personally reviewed and evaluated these images and lab results as part of my medical decision-making.   EKG Interpretation None      MDM   Final diagnoses:  Intractable cyclical vomiting with nausea  AKI (acute kidney injury) ([place])    Patient with intractable nausea and vomiting despite multiple IV medicines. In my estimation this is likely due to his marijuana use. However he does have evidence of acute kidney injury given he cannot tolerate oral fluids he will need admission for IV fluids and that her vomiting control. No abdominal tenderness to suggest needing a CT scan. Admit to the hospitalist.    Sherwood Gambler, MD 10/28/15 [phone]

## 2015-10-28 NOTE — ED Notes (Signed)
Patient is homeless and lives outside. He states that he has had persistent nausea and some emesis for about a week. He denies any other symptoms. No diarrhea or fever.

## 2015-10-28 NOTE — H&P (Signed)
[facility] History and Physical  Henry Kramer [id number] DOB: [birth year] DOA: 10/28/2015  Referring physician: EDP PCP: Carmie Kanner, NP   Chief Complaint: Nausea and vomiting  HPI: Henry Kramer is a 44 y.o. male with past medical history of polysubstance abuse and chronic nausea came into the hospital complaining about nausea and vomiting. Symptoms started 4-5 days ago, nausea, vomiting and not able to even drink, he started to have generalized weakness and darkening of his urine so he came to the hospital for further evaluation. Patient currently homeless and lives with a friend. In the ED initial evaluation showed acute renal failure with creatinine of 1.69, hemoconcentration likely from dehydration.  Review of Systems:  Constitutional: negative for anorexia, fevers and sweats Eyes: negative for irritation, redness and visual disturbance Ears, nose, mouth, throat, and face: negative for earaches, epistaxis, nasal congestion and sore throat Respiratory: negative for cough, dyspnea on exertion, sputum and wheezing Cardiovascular: negative for chest pain, dyspnea, lower extremity edema, orthopnea, palpitations and syncope GastrPer history of present illness Genitourinary: negative for dysuria, frequency and hematuria Hematologic/lymphatic: negative for bleeding, easy bruising and lymphadenopathy Musculoskeletal:negative for arthralgias, muscle weakness and stiff joints Neurological: negative for coordination problems, gait problems, headaches and weakness Endocrine: negative for diabetic symptoms including polydipsia, polyuria and weight loss Allergic/Immunologic: negative for anaphylaxis, hay fever and urticaria  Past Medical History  Diagnosis Date  . Heart murmur   . GERD (gastroesophageal reflux disease)   . AKI (acute kidney injury) ([place]) 05/22/2014   Past Surgical History  Procedure Laterality Date  . No past surgeries     Social History:   reports  that he has been smoking Cigarettes.  He has a 11 pack-year smoking history. He has never used smokeless tobacco. He reports that he drinks about 7.8 oz of alcohol per week. He reports that he uses illicit drugs (Marijuana).  No Known Allergies  Family history: No family history of colon cancer   Prior to Admission medications   Medication Sig Start Date End Date Taking? Authorizing Provider  UNKNOWN TO PATIENT Take 1 tablet by mouth once.   Yes Historical Provider, MD   Physical Exam: Filed Vitals:   10/28/15 1500 10/28/15 1520  BP: 115/78   Pulse:    Temp:  98.6 F (37 C)  Resp: 16    Constitutional: Oriented to person, place, and time. Well-developed and well-nourished. Cooperative.  Head: Normocephalic and atraumatic.  Nose: Nose normal.  Mouth/Throat: Uvula is midline, oropharynx is clear and moist and mucous membranes are normal.  Eyes: Conjunctivae and EOM are normal. Pupils are equal, round, and reactive to light.  Neck: Trachea normal and normal range of motion. Neck supple.  Cardiovascular: Normal rate, regular rhythm, S1 normal, S2 normal, normal heart sounds and intact distal pulses.   Pulmonary/Chest: Effort normal and breath sounds normal.  Abdominal: Soft. Bowel sounds are normal. There is no hepatosplenomegaly. There is no tenderness.  Musculoskeletal: Normal range of motion.  Neurological: Alert and oriented to person, place, and time. Has normal strength. No cranial nerve deficit or sensory deficit.  Skin: Skin is warm, dry and intact.  Psychiatric: Has a normal mood and affect. Speech is normal and behavior is normal.   Labs on Admission:  Basic Metabolic Panel:  Recent Labs Lab 10/28/15 1238  NA 138  K 3.5  CL 101  CO2 22  GLUCOSE 120*  BUN 43*  CREATININE 1.69*  CALCIUM 9.5   Liver Function Tests:  Recent Labs Lab  10/28/15 1238  AST 19  ALT 16*  ALKPHOS 53  BILITOT 1.0  PROT 8.5*  ALBUMIN 5.3*    Recent Labs Lab 10/28/15 1238    LIPASE 38   No results for input(s): AMMONIA in the last 168 hours. CBC:  Recent Labs Lab 10/28/15 1238  WBC 8.0  NEUTROABS 5.5  HGB 18.4*  HCT 51.6  MCV 82.4  PLT 275   Cardiac Enzymes: No results for input(s): CKTOTAL, CKMB, CKMBINDEX, TROPONINI in the last 168 hours.  BNP (last 3 results) No results for input(s): BNP in the last 8760 hours.  ProBNP (last 3 results) No results for input(s): PROBNP in the last 8760 hours.  CBG: No results for input(s): GLUCAP in the last 168 hours.  Radiological Exams on Admission: No results found.  EKG: pending  Assessment/Plan Principal Problem:   AKI (acute kidney injury) ([place]) Active Problems:   Chronic nausea   History of substance abuse   Intractable nausea and vomiting     Acute renal failure Presented with creatinine of 1.69, baseline creatinine 1.1. BUN/creatinine ratio suggests dehydration along with the clinical scenario. Hydrate patient with IV fluids, check BMP in a.m.  Intractable nausea and vomiting Patient has chronic nausea, presented with intractable nausea and vomiting. Challenge in the emergency department and was not able to eat or drink. Treat symptomatically with antiemetics, has history of GERD started on Protonix and Carafate. 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  History of substance abuse Tobacco, marijuana and alcohol abuse. Patient reported that is been some time since he drank alcohol. UDS and alcohol level to be obtained.  Weight loss Over the past 2 years patient reported loss of 30 pounds (from 170-140 pounds) Patient denies any black stools or other symptoms. No history of early cancer in the family.   Code Status: full code Family Communication:plan discussed with patient Disposition Plan:  MedSurg, observation   Time spent: 70 minutes  Macel Yearsley A, MD [facility] Pager [phone]

## 2015-10-28 NOTE — ED Notes (Signed)
Spoke with Lovena Le CN on 6E. 40min starting at 1536. [facility] RN aware

## 2015-10-28 NOTE — ED Notes (Signed)
Called to [place] for the 20 min notification. No one answered

## 2015-10-28 NOTE — ED Notes (Signed)
Patient's clothing, shoes, and cell phone sent with the patient to the unit.

## 2015-10-29 ENCOUNTER — Observation Stay (HOSPITAL_COMMUNITY): Payer: No Typology Code available for payment source

## 2015-10-29 ENCOUNTER — Encounter (HOSPITAL_COMMUNITY): Payer: Self-pay | Admitting: Radiology

## 2015-10-29 DIAGNOSIS — Z87898 Personal history of other specified conditions: Secondary | ICD-10-CM | POA: Diagnosis not present

## 2015-10-29 DIAGNOSIS — G43A1 Cyclical vomiting, intractable: Secondary | ICD-10-CM | POA: Diagnosis not present

## 2015-10-29 DIAGNOSIS — N179 Acute kidney failure, unspecified: Secondary | ICD-10-CM | POA: Diagnosis not present

## 2015-10-29 DIAGNOSIS — R110 Nausea: Secondary | ICD-10-CM | POA: Diagnosis not present

## 2015-10-29 DIAGNOSIS — E43 Unspecified severe protein-calorie malnutrition: Secondary | ICD-10-CM | POA: Insufficient documentation

## 2015-10-29 LAB — BASIC METABOLIC PANEL
ANION GAP: 6 (ref 5–15)
BUN: 27 mg/dL — AB (ref 6–20)
CHLORIDE: 110 mmol/L (ref 101–111)
CO2: 25 mmol/L (ref 22–32)
Calcium: 8.5 mg/dL — ABNORMAL LOW (ref 8.9–10.3)
Creatinine, Ser: 1.01 mg/dL (ref 0.61–1.24)
Glucose, Bld: 118 mg/dL — ABNORMAL HIGH (ref 65–99)
POTASSIUM: 4 mmol/L (ref 3.5–5.1)
SODIUM: 141 mmol/L (ref 135–145)

## 2015-10-29 LAB — CBC
HEMATOCRIT: 41.9 % (ref 39.0–52.0)
HEMOGLOBIN: 14.6 g/dL (ref 13.0–17.0)
MCH: 28.9 pg (ref 26.0–34.0)
MCHC: 34.8 g/dL (ref 30.0–36.0)
MCV: 83 fL (ref 78.0–100.0)
Platelets: 233 10*3/uL (ref 150–400)
RBC: 5.05 MIL/uL (ref 4.22–5.81)
RDW: 13.6 % (ref 11.5–15.5)
WBC: 5.1 10*3/uL (ref 4.0–10.5)

## 2015-10-29 LAB — HEPATITIS C ANTIBODY: HCV AB: 0.1 {s_co_ratio} (ref 0.0–0.9)

## 2015-10-29 LAB — HEMOGLOBIN A1C
Hgb A1c MFr Bld: 5.4 % (ref 4.8–5.6)
Mean Plasma Glucose: 108 mg/dL

## 2015-10-29 MED ORDER — IOHEXOL 300 MG/ML  SOLN: 25.0000 mL | INTRAMUSCULAR | Status: AC

## 2015-10-29 MED ORDER — SUCRALFATE 1 GM/10ML PO SUSP
1.0000 g | Freq: Three times a day (TID) | ORAL | Status: DC
  Administered 2015-10-29 – 2015-10-30 (×3): 1 g via ORAL
  Filled 2015-10-29 (×6): qty 10

## 2015-10-29 MED ORDER — IOHEXOL 300 MG/ML  SOLN
100.0000 mL | Freq: Once | INTRAMUSCULAR | Status: AC | PRN
  Administered 2015-10-29: 100 mL via INTRAVENOUS

## 2015-10-29 NOTE — Progress Notes (Signed)
Initial Nutrition Assessment  DOCUMENTATION CODES:   Severe malnutrition in context of acute illness/injury, Underweight  INTERVENTION:  - Continue Ensure Enlive BID, each supplement provides 350 kcal and 20 grams of protein - Encourage PO intakes of meals as tolerated - RD will continue to monitor for needs  NUTRITION DIAGNOSIS:   Inadequate oral intake related to acute illness, nausea, vomiting as evidenced by per patient/family report.  GOAL:   Patient will meet greater than or equal to 90% of their needs  MONITOR:   PO intake, Weight trends, Labs, Skin, I & O's  REASON FOR ASSESSMENT:   Malnutrition Screening Tool  ASSESSMENT:   44 y.o. male with past medical history of polysubstance abuse and chronic nausea came into the hospital complaining about nausea and vomiting. Symptoms started 4-5 days ago, nausea, vomiting and not able to even drink, he started to have generalized weakness and darkening of his urine so he came to the hospital for further evaluation. Patient currently homeless and lives with a friend.  Pt seen for MST. BMI indicates underweight status. Pt reports that for ~1 week PTA he had ongoing N/V. He denies emesis today and states that nausea is intermittent and mild in nature. He reports he has been drinking fluids frequently throughout the day but that for at least 3-4 days PTA he was unable to consume food or liquids or able to keep these items down due to emesis.   He states that he did not eat breakfast this AM as he was unsure if he was permitted to order food but that he was able to order lunch. Lunch tray arrived when RD was in the room and was pot roast, macaroni and cheese.  Pt states that he goes through "cycles" of current symptoms: N/V and inability to eat. These cycles usually last for a few days. Pt reports he has lost weight from UBW of 165 lbs but is unsure when weight loss began. Chart review indicates current weight is consistent with 05/22/14  weight; will continue to monitor. Moderate muscle wasting and mild fat wasting to upper body; lower body not assessed at this time.   Ensure Jeanne Ivan has already been ordered BID. Monitor for acceptance and tolerance of supplement. Medications reviewed. Labs reviewed; BUN elevated, Ca: 8.5 mg/dL.   Diet Order:  Diet regular Room service appropriate?: Yes; Fluid consistency:: Thin  Skin:  Reviewed, no issues  Last BM:  PTA  Height:   Ht Readings from Last 1 Encounters:  10/28/15 6\' 3"  (1.905 m)    Weight:   Wt Readings from Last 1 Encounters:  10/28/15 145 lb (65.772 kg)    Ideal Body Weight:  89.09 kg (kg)  BMI:  Body mass index is 18.12 kg/(m^2).  Estimated Nutritional Needs:   Kcal:  1650-1850  Protein:  65-75 grams  Fluid:  2-2.2 L/day  EDUCATION NEEDS:   No education needs identified at this time     Jarome Matin, RD, LDN Inpatient Clinical Dietitian Pager # [phone] After hours/weekend pager # [phone]

## 2015-10-29 NOTE — Progress Notes (Signed)
[facility] PROGRESS NOTE   Henry Kramer [id number] DOB: 1971-08-07 DOA: 10/28/2015 PCP: Carmie Kanner, NP  HPI/Subjective: Feels much better, still not able to tolerate diet. Continue symptomatically management with antiemetics.  Assessment/Plan: Principal Problem:   AKI (acute kidney injury) ([place]) Active Problems:   Chronic nausea   History of substance abuse   Intractable nausea and vomiting   Acute renal failure Presented with creatinine of 1.69, baseline creatinine 1.1. BUN/creatinine ratio suggests dehydration along with the clinical scenario. Creatinine improved from 1.69 down to 1.0 after IV fluid hydration, continue IV fluids for today.  Intractable nausea and vomiting Patient has chronic nausea, presented with intractable nausea and vomiting. Challenge in the emergency department and was not able to eat or drink. Treat symptomatically with antiemetics, has history of GERD started on Protonix and Carafate. 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  History of substance abuse Tobacco, marijuana and alcohol abuse. Patient reported that is been some time since he drank alcohol. UDS showed THC metabolites, negative for blood alcohol level is undetectable.  Weight loss Over the past 2 years patient reported loss of 30 pounds (from 170-140 pounds) Patient denies any black stools or other symptoms. No history of early cancer in the family. CT scan of abdomen/pelvis showed normal abdomen with possible liver hemangioma. Negative for HIV screening, normal A1c.  Code Status: Full Code Family Communication: Plan discussed with the patient. Disposition Plan: Discharge when able to tolerate diet suspect in a.m. Diet: Diet regular Room service appropriate?: Yes; Fluid consistency:: Thin  Consultants:  None  Procedures:  None  Antibiotics:  None   Objective: Filed Vitals:   10/28/15 2200 10/29/15  0514  BP: 115/66 115/69  Pulse: 70 70  Temp: 98.9 F (37.2 C) 98.5 F (36.9 C)  Resp: 16 16    Intake/Output Summary (Last 24 hours) at 10/29/15 1222 Last data filed at 10/29/15 1000  Gross per 24 hour  Intake   2735 ml  Output      0 ml  Net   2735 ml   Filed Weights   10/28/15 1614  Weight: 65.772 kg (145 lb)    Exam: General: Alert and awake, oriented x3, not in any acute distress. HEENT: anicteric sclera, pupils reactive to light and accommodation, EOMI CVS: S1-S2 clear, no murmur rubs or gallops Chest: clear to auscultation bilaterally, no wheezing, rales or rhonchi Abdomen: soft nontender, nondistended, normal bowel sounds, no organomegaly Extremities: no cyanosis, clubbing or edema noted bilaterally Neuro: Cranial nerves II-XII intact, no focal neurological deficits  Data Reviewed: Basic Metabolic Panel:  Recent Labs Lab 10/28/15 1238 10/29/15 0423  NA 138 141  K 3.5 4.0  CL 101 110  CO2 22 25  GLUCOSE 120* 118*  BUN 43* 27*  CREATININE 1.69* 1.01  CALCIUM 9.5 8.5*   Liver Function Tests:  Recent Labs Lab 10/28/15 1238  AST 19  ALT 16*  ALKPHOS 53  BILITOT 1.0  PROT 8.5*  ALBUMIN 5.3*    Recent Labs Lab 10/28/15 1238  LIPASE 38   No results for input(s): AMMONIA in the last 168 hours. CBC:  Recent Labs Lab 10/28/15 1238 10/29/15 0423  WBC 8.0 5.1  NEUTROABS 5.5  --   HGB 18.4* 14.6  HCT 51.6 41.9  MCV 82.4 83.0  PLT 275 233   Cardiac Enzymes: No results for input(s): CKTOTAL, CKMB, CKMBINDEX, TROPONINI in the last 168 hours. BNP (last 3 results) No results for input(s): BNP in  the last 8760 hours.  ProBNP (last 3 results) No results for input(s): PROBNP in the last 8760 hours.  CBG: No results for input(s): GLUCAP in the last 168 hours.  Micro No results found for this or any previous visit (from the past 240 hour(s)).   Studies: Ct Abdomen Pelvis W Contrast  10/29/2015  CLINICAL DATA:  Severe nausea and vomiting  since January 2016, history GERD, smoking EXAM: CT ABDOMEN AND PELVIS WITH CONTRAST TECHNIQUE: Multidetector CT imaging of the abdomen and pelvis was performed using the standard protocol following bolus administration of intravenous contrast. Sagittal and coronal MPR images reconstructed from axial data set. CONTRAST:  126mL OMNIPAQUE IOHEXOL 300 MG/ML SOLN IV. Dilute oral contrast. COMPARISON:  02/20/2009 FINDINGS: Lung bases clear. Lesion at anterior superior aspect of liver 16 x 15 mm in size with dense nodular enhancement following contrast likely representing an hepatic hemangioma. Additional hypervascular focus lateral segment LEFT lobe liver 21 x 13 mm image 14 question atypical hemangioma. Remainder of liver, gallbladder, spleen, pancreas, kidneys, and adrenal glands normal. Distended IVC similar to previous exam. Stomach decompressed, suboptimally assessed. Bowel loops grossly unremarkable. Bladder decompressed, with suboptimal assessment of wall thickness. No mass, adenopathy, free air, free fluid or inflammatory process otherwise seen. Bones unremarkable. IMPRESSION: Probable hepatic hemangioma with additional hypervascular lesion within liver which could represent an atypical hemangioma though other etiologies are not excluded. Characterization of these lesions by MR imaging with and without contrast recommended. Electronically Signed   By: Lavonia Dana M.D.   On: 10/29/2015 12:01    Scheduled Meds: . enoxaparin (LOVENOX) injection  40 mg Subcutaneous Q24H  . feeding supplement (ENSURE ENLIVE)  237 mL Oral BID BM  . pantoprazole  40 mg Oral BID AC  . sucralfate  1 g Oral TID WC & HS   Continuous Infusions: . dextrose 5 % and 0.9 % NaCl with KCl 20 mEq/L 125 mL/hr (10/28/15 2142)       Time spent: 35 minutes    [facility] A  [facility] Pager [phone] If 7PM-7AM, please contact night-coverage at [email or web address], password [facility] 10/29/2015, 12:22 PM

## 2015-10-29 NOTE — Clinical Social Work Note (Signed)
Clinical Social Work Assessment  Patient Details  Name: Henry Kramer MRN: [id number] Date of Birth: 12-04-1970  Date of referral:  10/29/15               Reason for consult:  Discharge Planning, Housing Concerns/Homelessness                Permission sought to share information with:    Permission granted to share information::     Name::        Agency::     Relationship::     Contact Information:     Housing/Transportation Living arrangements for the past 2 months:  Homeless, Apartment Source of Information:  Patient Patient Interpreter Needed:  None Criminal Activity/Legal Involvement Pertinent to Current Situation/Hospitalization:  No - Comment as needed Significant Relationships:  Friend Lives with:  Other (Comment) (Pt has been staying with a friend.) Do you feel safe going back to the place where you live?  Yes Need for family participation in patient care:  No (Coment)  Care giving concerns: No care giving concerns reported.   Social Worker assessment / plan:  Pt hospitalized on 10/28/15 with AKI. CSW consulted to assist with homelessness. Pt reports that e was staying wit a friend prior to admission and plans to d/c back to friends home. Pt reports tat he has been homeless on and off for years. Pt reports that he is familiar with the [facility] and local homeless shelters. CSW provided resources for day centers / shelters/ urban ministries / and bus passes.  Employment status:  Unemployed Nurse, adult PT Recommendations:    Information / Referral to community resources:  Shelter  Patient/Family's Response to care:  Pt plans to return to friends home at d/c.  Patient/Family's Understanding of and Emotional Response to Diagnosis, Current Treatment, and Prognosis:  Pt is aware of his medical status. He is frustrated with [name] and his homeless situation.   Emotional Assessment Appearance:  Appears stated age Attitude/Demeanor/Rapport:  Other  (cooperative) Affect (typically observed):  Accepting, Unable to Assess (uncomfortable r/t pain.) Orientation:  Oriented to Self, Oriented to Place, Oriented to  Time, Oriented to Situation Alcohol / Substance use:  Other (Denies recent use.) Psych involvement (Current and /or in the community):     Discharge Needs  Concerns to be addressed:  Homelessness Readmission within the last 30 days:  No Current discharge risk:  Homeless Barriers to Discharge:  No Barriers Identified   Loraine Maple  [id number] 10/29/2015, 1:11 PM

## 2015-10-30 DIAGNOSIS — G43A1 Cyclical vomiting, intractable: Secondary | ICD-10-CM | POA: Diagnosis not present

## 2015-10-30 DIAGNOSIS — Z87898 Personal history of other specified conditions: Secondary | ICD-10-CM | POA: Diagnosis not present

## 2015-10-30 DIAGNOSIS — N179 Acute kidney failure, unspecified: Secondary | ICD-10-CM | POA: Diagnosis not present

## 2015-10-30 DIAGNOSIS — E43 Unspecified severe protein-calorie malnutrition: Secondary | ICD-10-CM

## 2015-10-30 DIAGNOSIS — R110 Nausea: Secondary | ICD-10-CM | POA: Diagnosis not present

## 2015-10-30 LAB — BASIC METABOLIC PANEL
Anion gap: 5 (ref 5–15)
BUN: 15 mg/dL (ref 6–20)
CHLORIDE: 110 mmol/L (ref 101–111)
CO2: 24 mmol/L (ref 22–32)
CREATININE: 0.95 mg/dL (ref 0.61–1.24)
Calcium: 8.6 mg/dL — ABNORMAL LOW (ref 8.9–10.3)
GFR calc Af Amer: >60 mL/min (ref >60)
GFR calc non Af Amer: >60 mL/min (ref >60)
GLUCOSE: 121 mg/dL — AB (ref 65–99)
POTASSIUM: 3.8 mmol/L (ref 3.5–5.1)
SODIUM: 139 mmol/L (ref 135–145)

## 2015-10-30 LAB — CBC
HEMATOCRIT: 37.6 % — AB (ref 39.0–52.0)
Hemoglobin: 12.8 g/dL — ABNORMAL LOW (ref 13.0–17.0)
MCH: 28.3 pg (ref 26.0–34.0)
MCHC: 34 g/dL (ref 30.0–36.0)
MCV: 83.2 fL (ref 78.0–100.0)
PLATELETS: 202 10*3/uL (ref 150–400)
RBC: 4.52 MIL/uL (ref 4.22–5.81)
RDW: 13.4 % (ref 11.5–15.5)
WBC: 4.5 10*3/uL (ref 4.0–10.5)

## 2015-10-30 MED ORDER — ZOLPIDEM TARTRATE 5 MG PO TABS
5.0000 mg | ORAL_TABLET | Freq: Once | ORAL | Status: AC
  Administered 2015-10-30: 5 mg via ORAL
  Filled 2015-10-30: qty 1

## 2015-10-30 MED ORDER — METOCLOPRAMIDE HCL 5 MG/ML IJ SOLN
10.0000 mg | Freq: Three times a day (TID) | INTRAMUSCULAR | Status: DC
  Administered 2015-10-30 (×3): 10 mg via INTRAVENOUS
  Filled 2015-10-30 (×6): qty 2

## 2015-10-30 NOTE — Progress Notes (Signed)
[facility] PROGRESS NOTE   Henry Kramer [id number] DOB: 28-Jan-1971 DOA: 10/28/2015 PCP: Carmie Kanner, NP  HPI/Subjective: Not able to get anything, vomited once last night. I tried to see him in the morning he was in the shower, I saw him in very early afternoon and he was ready to get to the shower again. He reported hot showers helps his nausea.  Assessment/Plan: Principal Problem:   AKI (acute kidney injury) ([place]) Active Problems:   Chronic nausea   History of substance abuse   Intractable nausea and vomiting   Protein-calorie malnutrition, severe   Acute renal failure Presented with creatinine of 1.69, baseline creatinine 1.1. BUN/creatinine ratio suggests dehydration along with the clinical scenario. Creatinine improved from 1.69 down to 1.0 after IV fluid hydration, continue IV fluids for today.  Intractable nausea and vomiting Patient has chronic nausea, presented with intractable nausea and vomiting. Challenge in the emergency department and was not able to eat or drink. Treat symptomatically with antiemetics, has history of GERD started on Protonix and Carafate. 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 Stage complains about nausea, vomiting and not able to tolerate anything, CT scan is negative, I have asked GI to evaluate.  Thrombocytosis Elevated hemoglobin/hematocrit which is likely secondary to hemoconcentration from dehydration. 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 Hemoglobin went down after IV fluid hydration. I will check JAK2 [id number] mutation total PV out.Marland Kitchen  History of substance abuse Tobacco, marijuana and alcohol abuse. Patient reported that is been some time since he drank alcohol. UDS showed THC metabolites, negative for blood alcohol level is undetectable.  Weight loss Over the past 2 years patient reported loss of 30 pounds  (from 170-140 pounds) Patient denies any black stools or other symptoms. No history of early cancer in the family. CT scan of abdomen/pelvis showed normal abdomen with possible liver hemangioma. Negative for HIV screening, normal A1c.  Code Status: Full Code Family Communication: Plan discussed with the patient. Disposition Plan: Discharge when able to tolerate diet suspect in a.m. Diet: Diet regular Room service appropriate?: Yes; Fluid consistency:: Thin  Consultants:  None  Procedures:  None  Antibiotics:  None   Objective: Filed Vitals:   10/29/15 2238 10/30/15 0608  BP: 123/73 120/79  Pulse: 69 60  Temp: 98.9 F (37.2 C) 98.6 F (37 C)  Resp: 16 16    Intake/Output Summary (Last 24 hours) at 10/30/15 1402 Last data filed at 10/30/15 0902  Gross per 24 hour  Intake 1878.75 ml  Output   1000 ml  Net 878.75 ml   Filed Weights   10/28/15 1614  Weight: 65.772 kg (145 lb)    Exam: General: Alert and awake, oriented x3, not in any acute distress. HEENT: anicteric sclera, pupils reactive to light and accommodation, EOMI CVS: S1-S2 clear, no murmur rubs or gallops Chest: clear to auscultation bilaterally, no wheezing, rales or rhonchi Abdomen: soft nontender, nondistended, normal bowel sounds, no organomegaly Extremities: no cyanosis, clubbing or edema noted bilaterally Neuro: Cranial nerves II-XII intact, no focal neurological deficits  Data Reviewed: Basic Metabolic Panel:  Recent Labs Lab 10/28/15 1238 10/29/15 0423 10/30/15 0446  NA 138 141 139  K 3.5 4.0 3.8  CL 101 110 110  CO2 22 25 24   GLUCOSE 120* 118* 121*  BUN 43* 27* 15  CREATININE 1.69* 1.01 0.95  CALCIUM 9.5 8.5* 8.6*   Liver Function Tests:  Recent Labs Lab 10/28/15 1238  AST 19  ALT 16*  ALKPHOS 53  BILITOT 1.0  PROT 8.5*  ALBUMIN 5.3*    Recent Labs Lab 10/28/15 1238  LIPASE 38   No results for input(s): AMMONIA in the last 168 hours. CBC:  Recent Labs Lab  10/28/15 1238 10/29/15 0423 10/30/15 0446  WBC 8.0 5.1 4.5  NEUTROABS 5.5  --   --   HGB 18.4* 14.6 12.8*  HCT 51.6 41.9 37.6*  MCV 82.4 83.0 83.2  PLT 275 233 202   Cardiac Enzymes: No results for input(s): CKTOTAL, CKMB, CKMBINDEX, TROPONINI in the last 168 hours. BNP (last 3 results) No results for input(s): BNP in the last 8760 hours.  ProBNP (last 3 results) No results for input(s): PROBNP in the last 8760 hours.  CBG: No results for input(s): GLUCAP in the last 168 hours.  Micro No results found for this or any previous visit (from the past 240 hour(s)).   Studies: Ct Abdomen Pelvis W Contrast  10/29/2015  CLINICAL DATA:  Severe nausea and vomiting since January 2016, history GERD, smoking EXAM: CT ABDOMEN AND PELVIS WITH CONTRAST TECHNIQUE: Multidetector CT imaging of the abdomen and pelvis was performed using the standard protocol following bolus administration of intravenous contrast. Sagittal and coronal MPR images reconstructed from axial data set. CONTRAST:  180mL OMNIPAQUE IOHEXOL 300 MG/ML SOLN IV. Dilute oral contrast. COMPARISON:  02/20/2009 FINDINGS: Lung bases clear. Lesion at anterior superior aspect of liver 16 x 15 mm in size with dense nodular enhancement following contrast likely representing an hepatic hemangioma. Additional hypervascular focus lateral segment LEFT lobe liver 21 x 13 mm image 14 question atypical hemangioma. Remainder of liver, gallbladder, spleen, pancreas, kidneys, and adrenal glands normal. Distended IVC similar to previous exam. Stomach decompressed, suboptimally assessed. Bowel loops grossly unremarkable. Bladder decompressed, with suboptimal assessment of wall thickness. No mass, adenopathy, free air, free fluid or inflammatory process otherwise seen. Bones unremarkable. IMPRESSION: Probable hepatic hemangioma with additional hypervascular lesion within liver which could represent an atypical hemangioma though other etiologies are not  excluded. Characterization of these lesions by MR imaging with and without contrast recommended. Electronically Signed   By: Lavonia Dana M.D.   On: 10/29/2015 12:01    Scheduled Meds: . enoxaparin (LOVENOX) injection  40 mg Subcutaneous Q24H  . feeding supplement (ENSURE ENLIVE)  237 mL Oral BID BM  . metoCLOPramide (REGLAN) injection  10 mg Intravenous 3 times per day  . pantoprazole  40 mg Oral BID AC  . sucralfate  1 g Oral TID WC & HS   Continuous Infusions: . dextrose 5 % and 0.9 % NaCl with KCl 20 mEq/L 125 mL/hr at 10/30/15 0024       Time spent: 35 minutes    [facility] A  [facility] Pager [phone] If 7PM-7AM, please contact night-coverage at [email or web address], password [facility] 10/30/2015, 2:02 PM

## 2015-10-30 NOTE — Discharge Summary (Signed)
Physician Discharge Summary  Henry Kramer [id number] DOB: [birth year] DOA: 10/28/2015  PCP: Carmie Kanner, NP  Admit date: 10/28/2015 Discharge date: 10/30/2015  Time spent: 40 minutes  Recommendations for Outpatient Follow-up:   LEFT AMA   Discharge Diagnoses:  Principal Problem:   AKI (acute kidney injury) ([place]) Active Problems:   Chronic nausea   History of substance abuse   Intractable nausea and vomiting   Protein-calorie malnutrition, severe   Discharge Condition: Stable  Diet recommendation: reguular  Filed Weights   10/28/15 1614  Weight: 65.772 kg (145 lb)    History of present illness:  Henry Kramer is a 44 y.o. male with past medical history of polysubstance abuse and chronic nausea came into the hospital complaining about nausea and vomiting. Symptoms started 4-5 days ago, nausea, vomiting and not able to even drink, he started to have generalized weakness and darkening of his urine so he came to the hospital for further evaluation. Patient currently homeless and lives with a friend. In the ED initial evaluation showed acute renal failure with creatinine of 1.69, hemoconcentration likely from dehydration.  Hospital Course:   LEFT AMA See my progress note from earlier today. The floor did not notify me when the patient left AMA, Dr. Michail Sermon of [facility] GI page me with that. I have to call the floor to figure that out.  Procedures:  None  Consultations:  GI, not able to see him because he left AMA  Discharge Exam: Filed Vitals:   10/30/15 0608 10/30/15 1405  BP: 120/79 121/61  Pulse: 60 73  Temp: 98.6 F (37 C) 98.9 F (37.2 C)  Resp: 16 18   General: Alert and awake, oriented x3, not in any acute distress. HEENT: anicteric sclera, pupils reactive to light and accommodation, EOMI CVS: S1-S2 clear, no murmur rubs or gallops Chest: clear to auscultation bilaterally, no wheezing, rales or rhonchi Abdomen: soft nontender, nondistended,  normal bowel sounds, no organomegaly Extremities: no cyanosis, clubbing or edema noted bilaterally Neuro: Cranial nerves II-XII intact, no focal neurological deficits  Discharge Instructions    Discharge Medication List as of 10/30/2015  2:48 PM    CONTINUE these medications which have NOT CHANGED   Details  UNKNOWN TO PATIENT Take 1 tablet by mouth once., Historical Med       No Known Allergies    The results of significant diagnostics from this hospitalization (including imaging, microbiology, ancillary and laboratory) are listed below for reference.    Significant Diagnostic Studies: Ct Abdomen Pelvis W Contrast  10/29/2015  CLINICAL DATA:  Severe nausea and vomiting since January 2016, history GERD, smoking EXAM: CT ABDOMEN AND PELVIS WITH CONTRAST TECHNIQUE: Multidetector CT imaging of the abdomen and pelvis was performed using the standard protocol following bolus administration of intravenous contrast. Sagittal and coronal MPR images reconstructed from axial data set. CONTRAST:  17mL OMNIPAQUE IOHEXOL 300 MG/ML SOLN IV. Dilute oral contrast. COMPARISON:  02/20/2009 FINDINGS: Lung bases clear. Lesion at anterior superior aspect of liver 16 x 15 mm in size with dense nodular enhancement following contrast likely representing an hepatic hemangioma. Additional hypervascular focus lateral segment LEFT lobe liver 21 x 13 mm image 14 question atypical hemangioma. Remainder of liver, gallbladder, spleen, pancreas, kidneys, and adrenal glands normal. Distended IVC similar to previous exam. Stomach decompressed, suboptimally assessed. Bowel loops grossly unremarkable. Bladder decompressed, with suboptimal assessment of wall thickness. No mass, adenopathy, free air, free fluid or inflammatory process otherwise seen. Bones unremarkable. IMPRESSION: Probable hepatic hemangioma with  additional hypervascular lesion within liver which could represent an atypical hemangioma though other etiologies  are not excluded. Characterization of these lesions by MR imaging with and without contrast recommended. Electronically Signed   By: Lavonia Dana M.D.   On: 10/29/2015 12:01    Microbiology: No results found for this or any previous visit (from the past 240 hour(s)).   Labs: Basic Metabolic Panel:  Recent Labs Lab 10/28/15 1238 10/29/15 0423 10/30/15 0446  NA 138 141 139  K 3.5 4.0 3.8  CL 101 110 110  CO2 22 25 24   GLUCOSE 120* 118* 121*  BUN 43* 27* 15  CREATININE 1.69* 1.01 0.95  CALCIUM 9.5 8.5* 8.6*   Liver Function Tests:  Recent Labs Lab 10/28/15 1238  AST 19  ALT 16*  ALKPHOS 53  BILITOT 1.0  PROT 8.5*  ALBUMIN 5.3*    Recent Labs Lab 10/28/15 1238  LIPASE 38   No results for input(s): AMMONIA in the last 168 hours. CBC:  Recent Labs Lab 10/28/15 1238 10/29/15 0423 10/30/15 0446  WBC 8.0 5.1 4.5  NEUTROABS 5.5  --   --   HGB 18.4* 14.6 12.8*  HCT 51.6 41.9 37.6*  MCV 82.4 83.0 83.2  PLT 275 233 202   Cardiac Enzymes: No results for input(s): CKTOTAL, CKMB, CKMBINDEX, TROPONINI in the last 168 hours. BNP: BNP (last 3 results) No results for input(s): BNP in the last 8760 hours.  ProBNP (last 3 results) No results for input(s): PROBNP in the last 8760 hours.  CBG: No results for input(s): GLUCAP in the last 168 hours.     Signed:  Sheretta Grumbine A  [facility] 10/30/2015, 3:21 PM

## 2015-10-30 NOTE — Progress Notes (Signed)
Patient insisted that his IV be removed "because it isn't doing anything and a hot shower works just as well".  Patient verbalized understanding that he will no longer be able to receive any IV medications.

## 2015-10-30 NOTE — Progress Notes (Signed)
Patient left AMA. Form signed by patient and charge nurse, Windell Moulding, RN.

## 2015-10-31 SURGERY — EGD (ESOPHAGOGASTRODUODENOSCOPY)
Anesthesia: Moderate Sedation

## 2016-02-28 ENCOUNTER — Encounter (HOSPITAL_COMMUNITY): Payer: Self-pay

## 2016-02-28 ENCOUNTER — Emergency Department (HOSPITAL_COMMUNITY)
Admission: EM | Admit: 2016-02-28 | Discharge: 2016-02-29 | Disposition: A | Discharge status: Home / Self Care | Payer: No Typology Code available for payment source | Attending: Emergency Medicine | Admitting: Emergency Medicine

## 2016-02-28 DIAGNOSIS — R531 Weakness: Secondary | ICD-10-CM | POA: Insufficient documentation

## 2016-02-28 DIAGNOSIS — F17210 Nicotine dependence, cigarettes, uncomplicated: Secondary | ICD-10-CM | POA: Insufficient documentation

## 2016-02-28 DIAGNOSIS — R112 Nausea with vomiting, unspecified: Secondary | ICD-10-CM | POA: Insufficient documentation

## 2016-02-28 LAB — CBC
HEMATOCRIT: 50.3 % (ref 39.0–52.0)
HEMOGLOBIN: 17.7 g/dL — AB (ref 13.0–17.0)
MCH: 28.2 pg (ref 26.0–34.0)
MCHC: 35.2 g/dL (ref 30.0–36.0)
MCV: 80.2 fL (ref 78.0–100.0)
Platelets: 262 10*3/uL (ref 150–400)
RBC: 6.27 MIL/uL — AB (ref 4.22–5.81)
RDW: 14 % (ref 11.5–15.5)
WBC: 4.4 10*3/uL (ref 4.0–10.5)

## 2016-02-28 LAB — COMPREHENSIVE METABOLIC PANEL
ALBUMIN: 5.1 g/dL — AB (ref 3.5–5.0)
ALK PHOS: 53 U/L (ref 38–126)
ALT: 11 U/L — ABNORMAL LOW (ref 17–63)
ANION GAP: 15 (ref 5–15)
AST: 17 U/L (ref 15–41)
BUN: 21 mg/dL — ABNORMAL HIGH (ref 6–20)
CHLORIDE: 98 mmol/L — AB (ref 101–111)
CO2: 23 mmol/L (ref 22–32)
Calcium: 9.6 mg/dL (ref 8.9–10.3)
Creatinine, Ser: 1.63 mg/dL — ABNORMAL HIGH (ref 0.61–1.24)
GFR calc non Af Amer: 49 mL/min — ABNORMAL LOW (ref >60)
GFR, EST AFRICAN AMERICAN: 57 mL/min — AB (ref >60)
GLUCOSE: 119 mg/dL — AB (ref 65–99)
POTASSIUM: 3.3 mmol/L — AB (ref 3.5–5.1)
SODIUM: 136 mmol/L (ref 135–145)
Total Bilirubin: 1.2 mg/dL (ref 0.3–1.2)
Total Protein: 8.3 g/dL — ABNORMAL HIGH (ref 6.5–8.1)

## 2016-02-28 LAB — URINE MICROSCOPIC-ADD ON

## 2016-02-28 LAB — URINALYSIS, ROUTINE W REFLEX MICROSCOPIC
BILIRUBIN URINE: NEGATIVE
Glucose, UA: NEGATIVE mg/dL
Ketones, ur: NEGATIVE mg/dL
Leukocytes, UA: NEGATIVE
Nitrite: NEGATIVE
PH: 5 (ref 5.0–8.0)
Protein, ur: 30 mg/dL — AB
SPECIFIC GRAVITY, URINE: 1.018 (ref 1.005–1.030)

## 2016-02-28 LAB — LIPASE, BLOOD: LIPASE: 36 U/L (ref 11–51)

## 2016-02-28 MED ORDER — ONDANSETRON 4 MG PO TBDP
4.0000 mg | ORAL_TABLET | Freq: Once | ORAL | Status: AC | PRN
  Administered 2016-02-28: 4 mg via ORAL

## 2016-02-28 MED ORDER — ONDANSETRON 4 MG PO TBDP
ORAL_TABLET | ORAL | Status: AC
  Filled 2016-02-28: qty 1

## 2016-02-28 NOTE — ED Notes (Signed)
Pt reports severe nausea with inability to keep food down. He states this has been going on for 2 weeks and hasnt been able to eat anything for 2 weeks associated with weakness. He reports he has multiple episodes of this, last in December but was unable to get a diagnosis. He denies abdominal pain. He reports emesis x 3 today.

## 2016-02-28 NOTE — ED Notes (Signed)
Pt lwbs 

## 2016-07-29 ENCOUNTER — Telehealth: Payer: Self-pay | Admitting: *Deleted

## 2016-07-29 NOTE — Telephone Encounter (Signed)
APT. REMINDER CALL, NO ANSWER °

## 2016-07-30 ENCOUNTER — Encounter: Payer: Self-pay | Admitting: Internal Medicine

## 2016-07-30 ENCOUNTER — Ambulatory Visit (INDEPENDENT_AMBULATORY_CARE_PROVIDER_SITE_OTHER): Payer: Self-pay | Admitting: Internal Medicine

## 2016-07-30 VITALS — BP 109/59 | HR 70 | Temp 98.1°F | Ht 75.0 in | Wt 151.6 lb

## 2016-07-30 DIAGNOSIS — F17210 Nicotine dependence, cigarettes, uncomplicated: Secondary | ICD-10-CM

## 2016-07-30 DIAGNOSIS — R110 Nausea: Secondary | ICD-10-CM

## 2016-07-30 DIAGNOSIS — F1220 Cannabis dependence, uncomplicated: Secondary | ICD-10-CM

## 2016-07-30 DIAGNOSIS — R932 Abnormal findings on diagnostic imaging of liver and biliary tract: Secondary | ICD-10-CM

## 2016-07-30 DIAGNOSIS — Z87448 Personal history of other diseases of urinary system: Secondary | ICD-10-CM

## 2016-07-30 DIAGNOSIS — F17200 Nicotine dependence, unspecified, uncomplicated: Secondary | ICD-10-CM

## 2016-07-30 DIAGNOSIS — R319 Hematuria, unspecified: Secondary | ICD-10-CM | POA: Insufficient documentation

## 2016-07-30 DIAGNOSIS — F1911 Other psychoactive substance abuse, in remission: Secondary | ICD-10-CM

## 2016-07-30 DIAGNOSIS — N179 Acute kidney failure, unspecified: Secondary | ICD-10-CM

## 2016-07-30 MED ORDER — ONDANSETRON HCL 4 MG PO TABS: 4.0000 mg | ORAL_TABLET | Freq: Every day | ORAL | 1 refills | Status: DC | PRN

## 2016-07-30 MED ORDER — PANTOPRAZOLE SODIUM 40 MG PO TBEC: 40.0000 mg | DELAYED_RELEASE_TABLET | Freq: Every day | ORAL | 1 refills | Status: DC

## 2016-07-30 NOTE — Assessment & Plan Note (Addendum)
A: His history of painless hematuria is worrisome as are his previous urinalysis showing hematuria mostly without proteinuria given his smoking history.  He denies any other urinary symptoms or complaints.  P: - urinalysis with reflex to microscopy - urine cytology  ADDENDUM 9:43 AM 07/31/2016: - urinalysis negative for leukocytes, protein, or RBCs

## 2016-07-30 NOTE — Assessment & Plan Note (Addendum)
A: His history is suggestive of cannabis hyperemesis syndrome and this has been discussed with him in the past.  He has been advised to abstain from marijuana use but he continues to use regularly.  However, given his other features, I believe he warrants further diagnostic evaluation via endoscopy to look for other pathology (i.e. Malignancy, peptic ulcer disease, gastritis, etc).  P: - ambulatory referral to GI. - counseled on marijuana cessation. - refilled his PPI and anti-emetics.

## 2016-07-30 NOTE — Assessment & Plan Note (Signed)
A: Review of records note a CT in 2010 showed an indeterminate lesion in the "anterior liver dome". CT 10/2015 showed two hepatic lesions, one in the anterior superior aspect of the liver (16 x 15 mm) and left lateral segment (21 x 21mm). There were no comments as to whether he had changes of chronic liver disease on either exam. I believe he warrants further diagnostic evaluation and an MRI of the liver with contrast had been ordered by GI at [facility] but never completed by the patient.  P: - GI referral placed today. - will also order an MRI liver with contrast

## 2016-07-30 NOTE — Assessment & Plan Note (Signed)
A: He smokes tobacco daily, uses marijuana regularly, and alcohol occasionally.  P: - counseled on marijuana and tobacco cessation.

## 2016-07-30 NOTE — Progress Notes (Signed)
CC: here for chronic nausea and establishment of care  HPI:  Mr.Oziel D Kramer is a 45 y.o. man with a past medical history listed below here today for follow up of his chronic nausea, hematuria, and to establish care with [facility].   Chronic Nausea: Patient reports that symptoms began approximately in 2008.  Has been hospitalized a couple times and referred to GI but has never had EGD or colonoscopy.  Last saw GI at [facility] but unable to follow up because of financial and transportation issues as he is homeless.  Today, he is feeling good but he frequent, almost daily nausea that is located epigastrically and not always without precipitating factors.  He states that sometimes eating or marijuana use will make it worse and he thinks he has some early satiety.  He does not have much vomiting and, if he does, it is non-bloody and described as bright yellow with bile.  He reports taking a hot shower or bath with very hot water will improve his symptoms.  He does not have dysphagia, odynophagia, or abdominal pain.  He will occasionally have GERD-like symptoms with a burning in his throat but denies any cough.  He has not been on his PPI since running out of this medication.  He has also been without his anti-emetics.  He reports constipation associated with not eating enough food and that when he has access to food he normally has regular bowel movements but intermittently will have melena.  He reports weight loss as well with current weight of 151 pounds.  He was 169 pounds in February and states his normal weight is in the 160's.  His GI at [facility] had planned for EGD, colonoscopy, and MRI to better characterize his liver lesions but he was not able to follow up.   He denies NSAID use.  Hematuria: Patient reports last week having 2 days worth of painless, gross hematuria that has since resolved.  States his urine is still dark.  Reviewing prior urinalysis results, he has had hemoglobinuria with  RBC's on microscopy going back to 2015.  He is an active smoker.   He reports no dysuria or urethral discharge and reports last sexual activity being 3 years ago.  Social History: daily tobacco use, frequent marijuana use, occasional EtOH use.  No other elicit drugs.  Currently unemployed. Homeless off and on for 8 years.  Not sexually active for 3 years.  When active, he is only with women.  Negative HIV 2015.  Family History: father died of cirrhosis from EtOH, mom had breast cancer, sister had breast cancer, brother has diabetes.  Denies family history of colon cancer.  For details of today's visit and the status of his chronic medical issues please refer to the assessment and plan.   Past Medical History:  Diagnosis Date  . AKI (acute kidney injury) ([place]) 05/22/2014  . Chronic nausea   . GERD (gastroesophageal reflux disease)   . Homelessness   . Substance abuse    tobacco, marijuana, EtOH    Review of Systems:   Please see pertinent ROS reviewed in HPI and problem based charting.  Physical Exam:  Vitals:   07/30/16 1420  BP: (!) 109/59  Pulse: 70  Temp: 98.1 F (36.7 C)  TempSrc: Oral  SpO2: 100%  Weight: 151 lb 9.6 oz (68.8 kg)  Height: 6\' 3"  (1.905 m)   Physical Exam  Constitutional: He is oriented to person, place, and time.  Very pleasant, AA man,  thin appearing, no distress.  HENT:  Head: Normocephalic and atraumatic.  Mouth/Throat: No oropharyngeal exudate.  Eyes: EOM are normal. No scleral icterus.  Cardiovascular: Normal rate, regular rhythm and normal heart sounds.   Pulmonary/Chest: Effort normal and breath sounds normal. He has no wheezes. He has no rales.  Abdominal: Soft. Bowel sounds are normal. He exhibits no distension and no mass. There is no tenderness. There is no rebound and no guarding.  Musculoskeletal: Normal range of motion. He exhibits no edema.  Neurological: He is alert and oriented to person, place, and time.  Skin: Skin is warm and dry.    Psychiatric: Mood and affect normal.     Assessment & Plan:   See Encounters Tab for problem based charting.  Patient discussed with Dr. Dareen Piano.  AKI (acute kidney injury) ([place]) BMP Latest Ref Rng & Units 02/28/2016 10/30/2015 10/29/2015  Glucose 65 - 99 mg/dL 119(H) 121(H) 118(H)  BUN 6 - 20 mg/dL 21(H) 15 27(H)  Creatinine 0.61 - 1.24 mg/dL 1.63(H) 0.95 1.01  Sodium 135 - 145 mmol/L 136 139 141  Potassium 3.5 - 5.1 mmol/L 3.3(L) 3.8 4.0  Chloride 101 - 111 mmol/L 98(L) 110 110  CO2 22 - 32 mmol/L 23 24 25   Calcium 8.9 - 10.3 mg/dL 9.6 8.6(L) 8.5(L)   A: His last creatinine was 1.63 in April 2017.  He has experienced numerous acute kidney injuries in the setting of his nausea, vomiting, and dehydration with subsequent return to baselines.  P:  - will plan to repeat CMET at follow up in 1 month.  Smoking A: Patient smokes about 1/2 PPD for the past 25 years.  He is currently not interested in cessation.  P: - we will continue to address at follow up visits.  Chronic nausea A: His history is suggestive of cannabis hyperemesis syndrome and this has been discussed with him in the past.  He has been advised to abstain from marijuana use but he continues to use regularly.  However, given his other features, I believe he warrants further diagnostic evaluation via endoscopy to look for other pathology (i.e. Malignancy, peptic ulcer disease, gastritis, etc).  P: - ambulatory referral to GI. - counseled on marijuana cessation. - refilled his PPI and anti-emetics.      History of substance abuse A: He smokes tobacco daily, uses marijuana regularly, and alcohol occasionally.  P: - counseled on marijuana and tobacco cessation.  Hematuria A: His history of painless hematuria is worrisome as are his previous urinalysis showing hematuria mostly without proteinuria given his smoking history.  He denies any other urinary symptoms or complaints.  P: - urinalysis with  reflex to microscopy - urine cytology  Abnormal CT of liver A: Review of records note a CT in 2010 showed an indeterminate lesion in the "anterior liver dome". CT 10/2015 showed two hepatic lesions, one in the anterior superior aspect of the liver (16 x 15 mm) and left lateral segment (21 x 11mm). There were no comments as to whether he had changes of chronic liver disease on either exam. I believe he warrants further diagnostic evaluation and an MRI of the liver with contrast had been ordered by GI at [facility] but never completed by the patient.  P: - GI referral placed today. - will also order an MRI liver with contrast

## 2016-07-30 NOTE — Assessment & Plan Note (Signed)
BMP Latest Ref Rng & Units 02/28/2016 10/30/2015 10/29/2015  Glucose 65 - 99 mg/dL 119(H) 121(H) 118(H)  BUN 6 - 20 mg/dL 21(H) 15 27(H)  Creatinine 0.61 - 1.24 mg/dL 1.63(H) 0.95 1.01  Sodium 135 - 145 mmol/L 136 139 141  Potassium 3.5 - 5.1 mmol/L 3.3(L) 3.8 4.0  Chloride 101 - 111 mmol/L 98(L) 110 110  CO2 22 - 32 mmol/L 23 24 25   Calcium 8.9 - 10.3 mg/dL 9.6 8.6(L) 8.5(L)   A: His last creatinine was 1.63 in April 2017.  He has experienced numerous acute kidney injuries in the setting of his nausea, vomiting, and dehydration with subsequent return to baselines.  P:  - will plan to repeat CMET at follow up in 1 month.

## 2016-07-30 NOTE — Assessment & Plan Note (Signed)
A: Patient smokes about 1/2 PPD for the past 25 years.  He is currently not interested in cessation.  P: - we will continue to address at follow up visits.

## 2016-07-30 NOTE — Patient Instructions (Signed)
I have sent prescriptions to your pharmacy.  We will check your urine today.  Based on these results we may need to send you for further evaluation.  I have placed a referral for you to be seen by gastroenterology.

## 2016-07-31 LAB — URINALYSIS, ROUTINE W REFLEX MICROSCOPIC
Bilirubin, UA: NEGATIVE
GLUCOSE, UA: NEGATIVE
KETONES UA: NEGATIVE
LEUKOCYTES UA: NEGATIVE
Nitrite, UA: NEGATIVE
Protein, UA: NEGATIVE
RBC, UA: NEGATIVE
SPEC GRAV UA: 1.016 (ref 1.005–1.030)
Urobilinogen, Ur: 1 mg/dL (ref 0.2–1.0)
pH, UA: 6.5 (ref 5.0–7.5)

## 2016-07-31 LAB — URINE CYTOLOGY ANCILLARY ONLY
Chlamydia: NEGATIVE
Neisseria Gonorrhea: NEGATIVE
Trichomonas: NEGATIVE

## 2016-08-01 NOTE — Progress Notes (Signed)
Internal Medicine Clinic Attending  Case discussed with Dr. Wallace at the time of the visit.  We reviewed the resident's history and exam and pertinent patient test results.  I agree with the assessment, diagnosis, and plan of care documented in the resident's note.  

## 2016-08-11 ENCOUNTER — Ambulatory Visit (HOSPITAL_COMMUNITY)
Admission: RE | Admit: 2016-08-11 | Discharge: 2016-08-11 | Disposition: A | Discharge status: Home / Self Care | Payer: Self-pay | Source: Ambulatory Visit | Attending: Internal Medicine | Admitting: Internal Medicine

## 2016-08-11 DIAGNOSIS — D1803 Hemangioma of intra-abdominal structures: Secondary | ICD-10-CM | POA: Insufficient documentation

## 2016-08-11 DIAGNOSIS — R932 Abnormal findings on diagnostic imaging of liver and biliary tract: Secondary | ICD-10-CM

## 2016-08-11 MED ORDER — GADOBENATE DIMEGLUMINE 529 MG/ML IV SOLN
15.0000 mL | Freq: Once | INTRAVENOUS | Status: AC | PRN
  Administered 2016-08-11: 15 mL via INTRAVENOUS

## 2016-09-03 ENCOUNTER — Telehealth: Payer: Self-pay | Admitting: Internal Medicine

## 2016-09-03 NOTE — Telephone Encounter (Signed)
APT. REMINDER CALL, LMTCB °

## 2016-09-04 ENCOUNTER — Ambulatory Visit (INDEPENDENT_AMBULATORY_CARE_PROVIDER_SITE_OTHER): Payer: Self-pay | Admitting: Internal Medicine

## 2016-09-04 ENCOUNTER — Encounter: Payer: Self-pay | Admitting: Internal Medicine

## 2016-09-04 VITALS — BP 117/69 | HR 70 | Temp 98.2°F | Ht 75.0 in | Wt 144.6 lb

## 2016-09-04 DIAGNOSIS — F17200 Nicotine dependence, unspecified, uncomplicated: Secondary | ICD-10-CM

## 2016-09-04 DIAGNOSIS — R110 Nausea: Secondary | ICD-10-CM

## 2016-09-04 DIAGNOSIS — R351 Nocturia: Secondary | ICD-10-CM

## 2016-09-04 DIAGNOSIS — F17210 Nicotine dependence, cigarettes, uncomplicated: Secondary | ICD-10-CM

## 2016-09-04 DIAGNOSIS — Z0000 Encounter for general adult medical examination without abnormal findings: Secondary | ICD-10-CM

## 2016-09-04 DIAGNOSIS — N401 Enlarged prostate with lower urinary tract symptoms: Secondary | ICD-10-CM

## 2016-09-04 DIAGNOSIS — Z23 Encounter for immunization: Secondary | ICD-10-CM

## 2016-09-04 DIAGNOSIS — F1220 Cannabis dependence, uncomplicated: Secondary | ICD-10-CM

## 2016-09-04 MED ORDER — ONDANSETRON HCL 4 MG PO TABS: 4.0000 mg | ORAL_TABLET | Freq: Every day | ORAL | 1 refills | Status: AC | PRN

## 2016-09-04 MED ORDER — PANTOPRAZOLE SODIUM 40 MG PO TBEC: 40.0000 mg | DELAYED_RELEASE_TABLET | Freq: Every day | ORAL | 1 refills | Status: DC

## 2016-09-04 MED ORDER — TAMSULOSIN HCL 0.4 MG PO CAPS: 0.4000 mg | ORAL_CAPSULE | Freq: Every day | ORAL | 1 refills | Status: DC

## 2016-09-04 NOTE — Patient Instructions (Addendum)
I am checking some lab work today and will let you know if anything is abnormal.  I have sent some refills to your pharmacy and started a new medication called Flomax for your urinary complaints.  Please call the stomach doctors at [facility] to be seen in order to have your endoscopy procedure.  It is very important for you to stop smoking marijuana going forward and stopping tobacco.  Please drink Ensure to increase your calories.  Please follow up in 3 months or sooner if you need to .

## 2016-09-04 NOTE — Progress Notes (Signed)
   CC: here for follow up of chronic nausea and complaint of urinary hesitancy and nocturia  HPI:  Henry Kramer is a 45 y.o. man with a past medical history listed below here today for follow up of his chronic nausea and urinary hesitancy and nocturia.   For details of today's visit and the status of his chronic medical issues please refer to the assessment and plan.   Past Medical History:  Diagnosis Date  . AKI (acute kidney injury) ([place]) 05/22/2014  . Chronic nausea   . GERD (gastroesophageal reflux disease)   . Homelessness   . Substance abuse    tobacco, marijuana, EtOH    Review of Systems:   Please see pertinent ROS reviewed in HPI and problem based charting.   Physical Exam:  Vitals:   09/04/16 1406  BP: 117/69  Pulse: 70  Temp: 98.2 F (36.8 C)  TempSrc: Oral  SpO2: 99%  Weight: 144 lb 9.6 oz (65.6 kg)  Height: 6\' 3"  (1.905 m)   Physical Exam  Constitutional: He is oriented to person, place, and time.  Thin, pleasant, AA man, no distress  HENT:  Head: Normocephalic and atraumatic.  Cardiovascular: Normal rate, regular rhythm and normal heart sounds.   Pulmonary/Chest: Effort normal.  Abdominal: Soft. Bowel sounds are normal. He exhibits no distension. There is no tenderness.  Neurological: He is alert and oriented to person, place, and time.  Psychiatric: Mood and affect normal.     Assessment & Plan:   See Encounters Tab for problem based charting.  Patient discussed with Dr. Evette Doffing.   Chronic nausea A: Patient has been unable to be seen by GI due to hx of recurrent no shows with local providers.  Still complains of chronic nausea that is suggestive of cannabis hyperemesis syndrome.  He has not been taking his PPI or anti-emetics.  P: - recommended he attempt to reschedule with [facility] as they were the one's in February who wanted to pursue endoscopic evaluation. - refilled his PPI and anti-emetics - strongly encouraged him to abstain from  marijuana to potentially solve this issue or exclude cannabis hyperemesis  Smoking A: Continues to smoke but has not been interested in cutting back at this point in time.  P: - smoking cessation encouraged. - continue to address.  Benign prostatic hyperplasia with nocturia A: Reports occasionally in the past having symptoms such as dribbling with urination and frequently having to urinate at night.  This has progressively increased over recent weeks.  He has no other symptoms and no alarm symptoms such as weight loss, back pain, fevers, chills, night sweats.  No family hx of prostate issues.  P: - trial of Flomax 0.4mg  daily after breakfast - RTC in 4 weeks to evaluate symptoms  Healthcare maintenance A/P: - flu shot given today.

## 2016-09-06 DIAGNOSIS — Z1211 Encounter for screening for malignant neoplasm of colon: Secondary | ICD-10-CM | POA: Insufficient documentation

## 2016-09-06 DIAGNOSIS — Z0000 Encounter for general adult medical examination without abnormal findings: Secondary | ICD-10-CM | POA: Insufficient documentation

## 2016-09-06 DIAGNOSIS — R351 Nocturia: Secondary | ICD-10-CM

## 2016-09-06 DIAGNOSIS — N401 Enlarged prostate with lower urinary tract symptoms: Secondary | ICD-10-CM | POA: Insufficient documentation

## 2016-09-06 NOTE — Assessment & Plan Note (Signed)
A: Patient has been unable to be seen by GI due to hx of recurrent no shows with local providers.  Still complains of chronic nausea that is suggestive of cannabis hyperemesis syndrome.  He has not been taking his PPI or anti-emetics.  P: - recommended he attempt to reschedule with [facility] as they were the one's in February who wanted to pursue endoscopic evaluation. - refilled his PPI and anti-emetics - strongly encouraged him to abstain from marijuana to potentially solve this issue or exclude cannabis hyperemesis

## 2016-09-06 NOTE — Assessment & Plan Note (Signed)
A/P: - flu shot given today.

## 2016-09-06 NOTE — Assessment & Plan Note (Addendum)
A: Reports occasionally in the past having symptoms such as dribbling with urination and frequently having to urinate at night.  This has progressively increased over recent weeks.  He has no other symptoms and no alarm symptoms such as weight loss, back pain, fevers, chills, night sweats.  No family hx of prostate issues.  P: - trial of Flomax 0.4mg  daily after breakfast - RTC in 8 weeks to evaluate symptoms

## 2016-09-06 NOTE — Assessment & Plan Note (Signed)
A: Continues to smoke but has not been interested in cutting back at this point in time.  P: - smoking cessation encouraged. - continue to address.

## 2016-09-08 NOTE — Progress Notes (Signed)
Internal Medicine Clinic Attending  Case discussed with Dr. Wallace at the time of the visit.  We reviewed the resident's history and exam and pertinent patient test results.  I agree with the assessment, diagnosis, and plan of care documented in the resident's note.  

## 2016-10-29 IMAGING — CT CT ABD-PELV W/ CM
2 of 5 series · 17 of 46 positions shown, 19 images · IV contrast (OMNIPAQUE)
Comparison: 02/20/2009

CLINICAL DATA: Severe nausea and vomiting since November 2014,
history GERD, smoking

EXAM:
CT ABDOMEN AND PELVIS WITH CONTRAST
TECHNIQUE: Multidetector CT imaging of the abdomen and pelvis was performed
using the standard protocol following bolus administration of
intravenous contrast. Sagittal and coronal MPR images reconstructed
from axial data set.
CONTRAST:  100mL OMNIPAQUE IOHEXOL 300 MG/ML SOLN IV. Dilute oral
contrast.

[Series 2: rtn ap with st · axial · 0.74mm/px · z∈[-491,-96]mm · 14 of 89 slices shown, 16 images]
[im 5/89  soft-tissue]
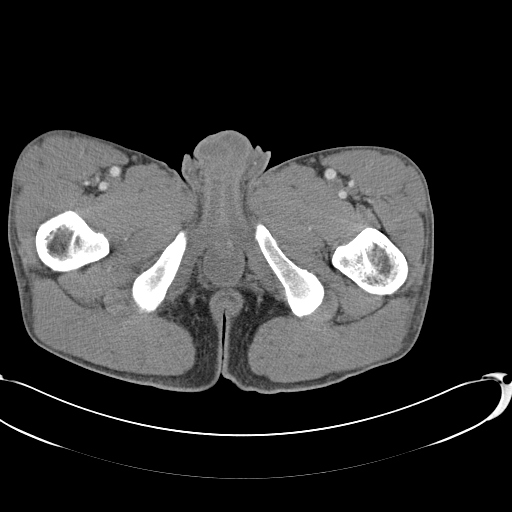
[im 5/89  bone]
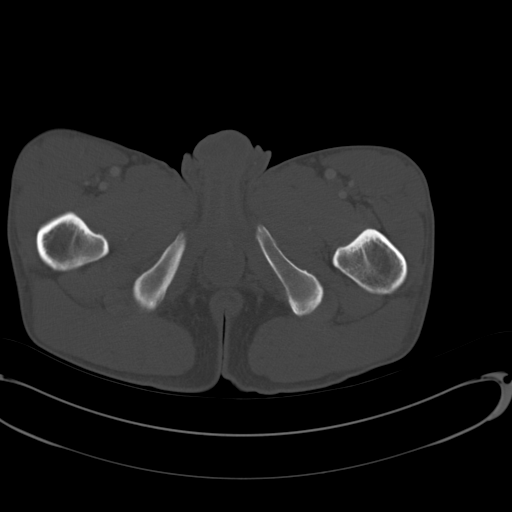
[im 14/89  soft-tissue]
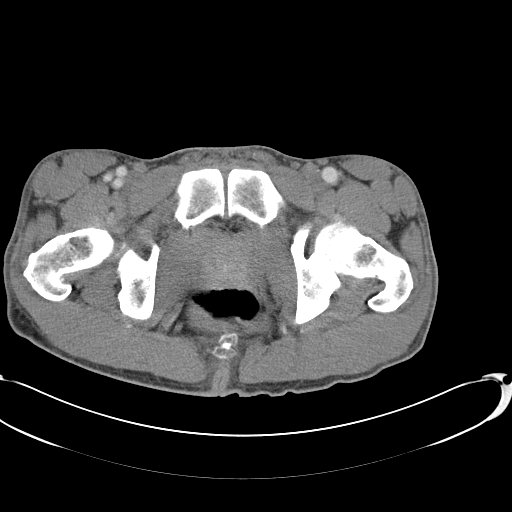
[im 18/89  soft-tissue]
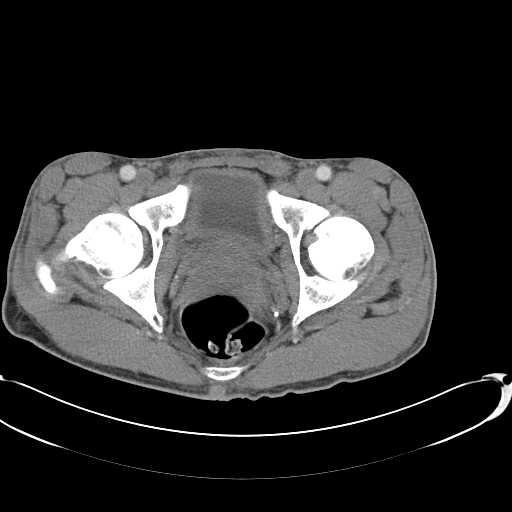
[im 23/89  soft-tissue]
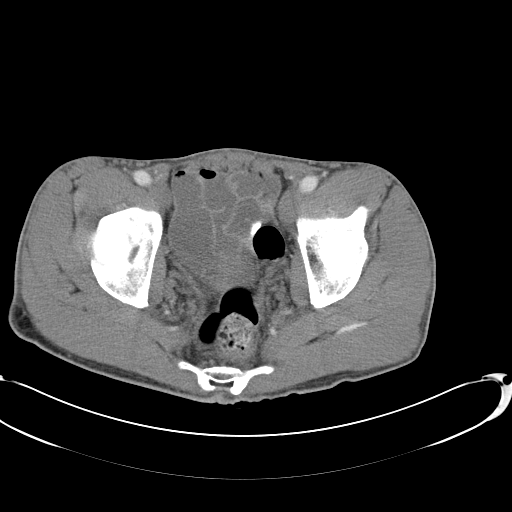
[im 31/89  soft-tissue]
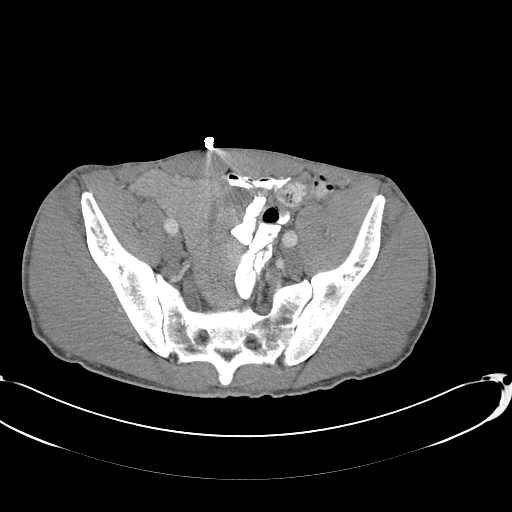
[im 36/89  soft-tissue]
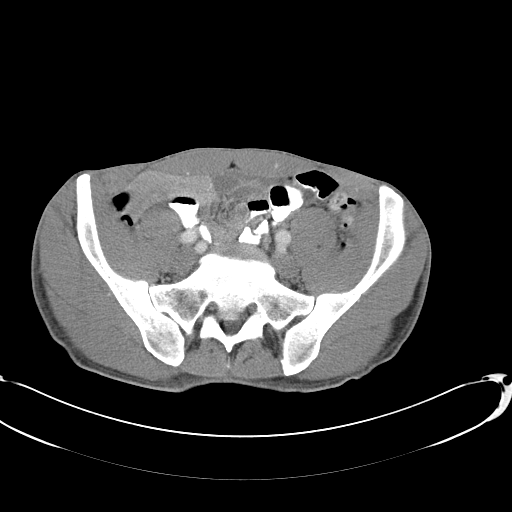
[im 40/89  soft-tissue]
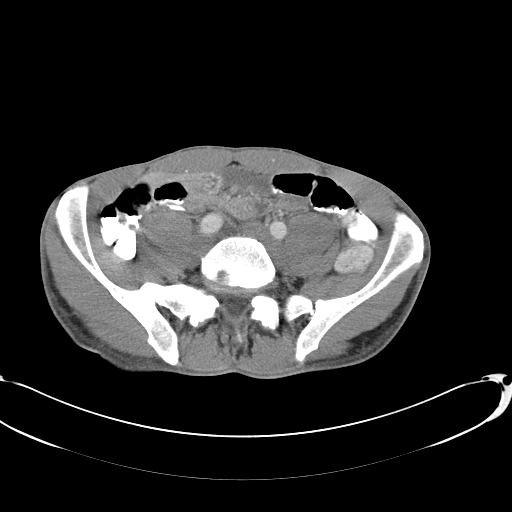
[im 49/89  soft-tissue]
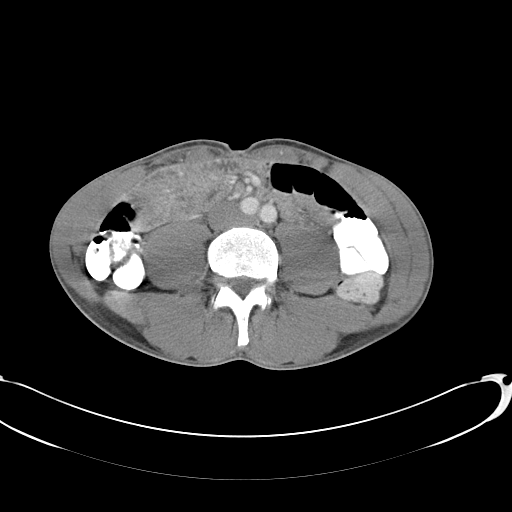
[im 53/89  soft-tissue]
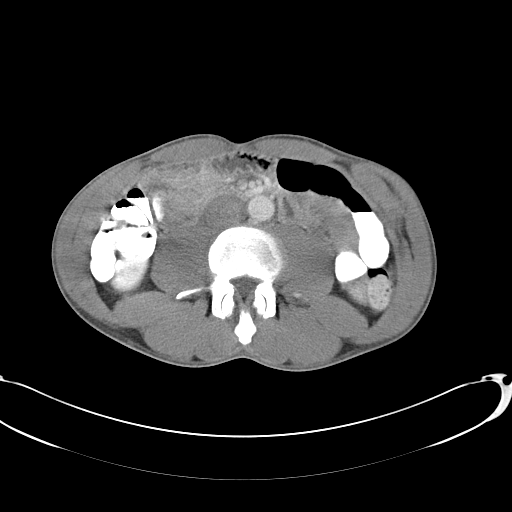
[im 53/89  bone]
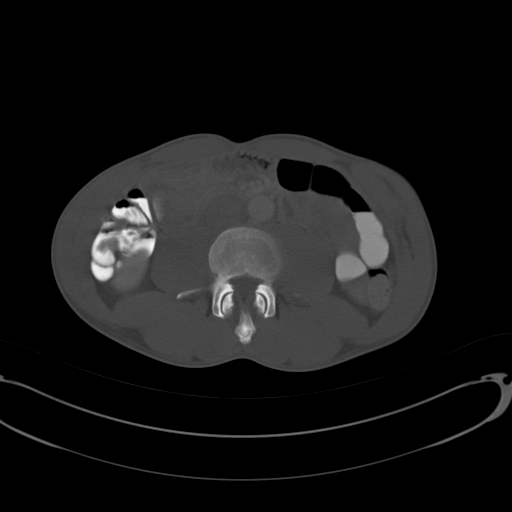
[im 58/89  soft-tissue]
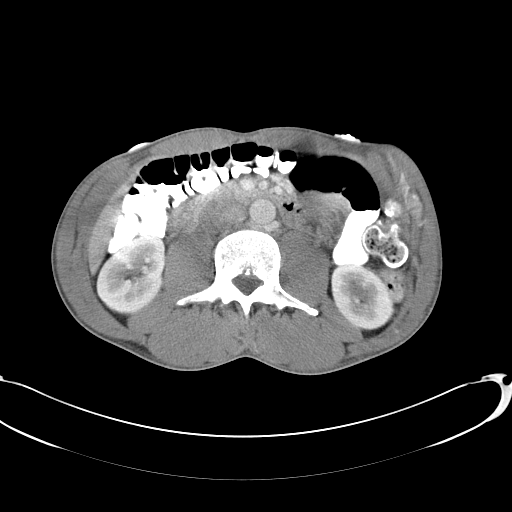
[im 67/89  soft-tissue]
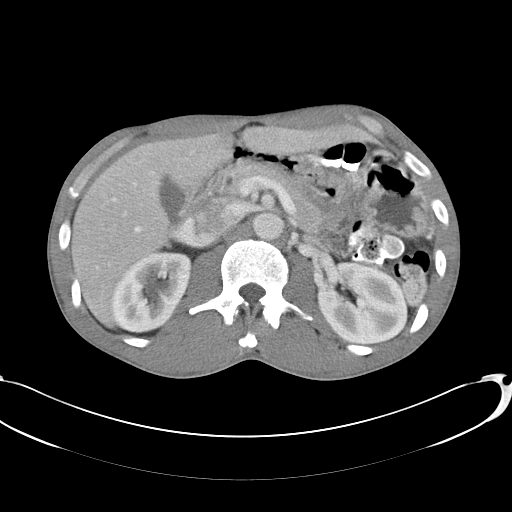
[im 71/89  soft-tissue]
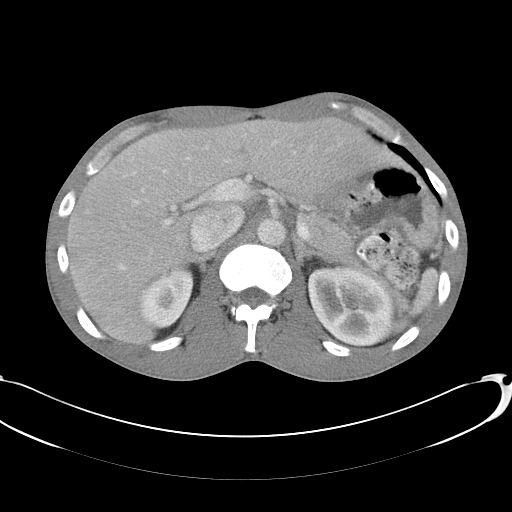
[im 75/89  soft-tissue]
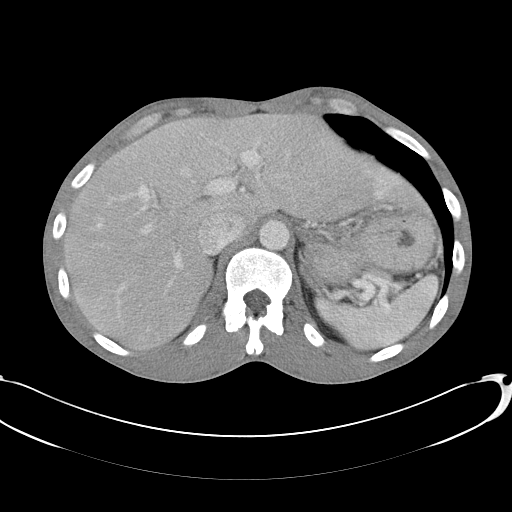
[im 84/89  soft-tissue]
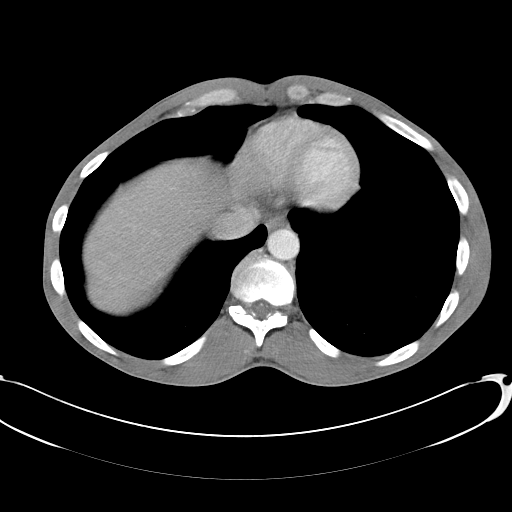

[Series 602: cor · coronal · 0.90mm/px · 3 of 75 slices shown]
[im 25/75  soft-tissue]
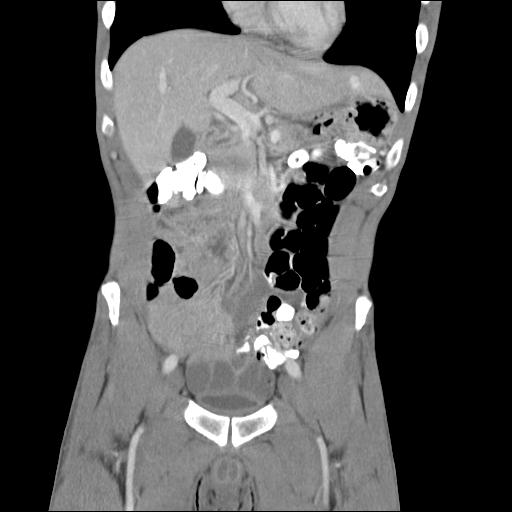
[im 33/75  soft-tissue]
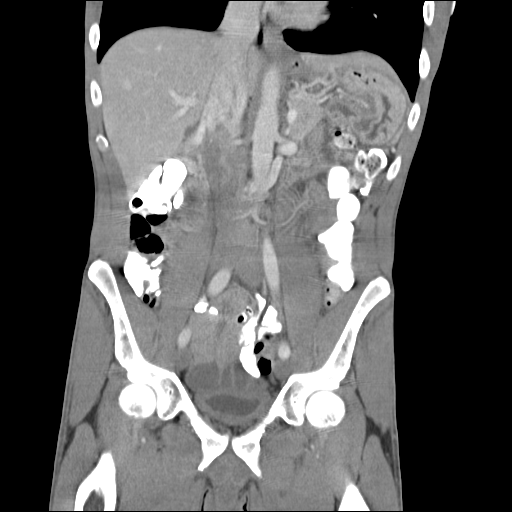
[im 42/75  soft-tissue]
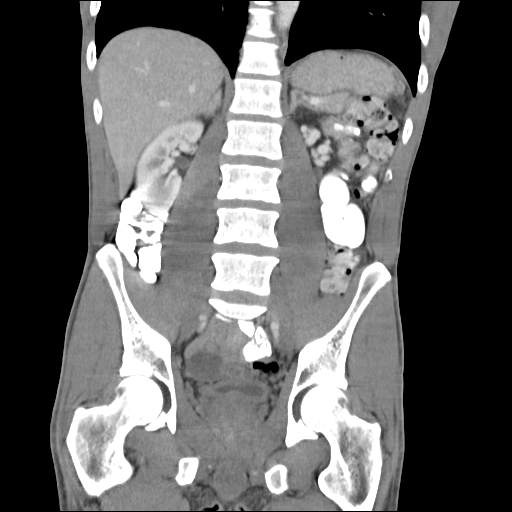

[17 of 46 positions shown; findings below may reference images not displayed]

FINDINGS: Lung bases clear.

Lesion at anterior superior aspect of liver 16 x 15 mm in size with
dense nodular enhancement following contrast likely representing an
hepatic hemangioma.

Additional hypervascular focus lateral segment LEFT lobe liver 21 x
13 mm image 14 question atypical hemangioma.

Remainder of liver, gallbladder, spleen, pancreas, kidneys, and
adrenal glands normal.

Distended IVC similar to previous exam.

Stomach decompressed, suboptimally assessed.

Bowel loops grossly unremarkable.

Bladder decompressed, with suboptimal assessment of wall thickness.

No mass, adenopathy, free air, free fluid or inflammatory process
otherwise seen.

Bones unremarkable.
IMPRESSION: Probable hepatic hemangioma with additional hypervascular lesion
within liver which could represent an atypical hemangioma though
other etiologies are not excluded.

Characterization of these lesions by MR imaging with and without
contrast recommended.

## 2017-06-18 ENCOUNTER — Encounter: Payer: Self-pay | Admitting: Internal Medicine

## 2017-09-29 ENCOUNTER — Inpatient Hospital Stay (HOSPITAL_COMMUNITY): Payer: Self-pay

## 2017-09-29 ENCOUNTER — Encounter (HOSPITAL_COMMUNITY): Payer: Self-pay | Admitting: Emergency Medicine

## 2017-09-29 ENCOUNTER — Inpatient Hospital Stay (HOSPITAL_COMMUNITY)
Admission: EM | Admit: 2017-09-29 | Discharge: 2017-10-01 | DRG: 682 | Disposition: A | Discharge status: Home / Self Care | Payer: Self-pay | Attending: Internal Medicine | Admitting: Internal Medicine

## 2017-09-29 ENCOUNTER — Other Ambulatory Visit: Payer: Self-pay

## 2017-09-29 DIAGNOSIS — E860 Dehydration: Secondary | ICD-10-CM | POA: Diagnosis present

## 2017-09-29 DIAGNOSIS — N179 Acute kidney failure, unspecified: Principal | ICD-10-CM | POA: Diagnosis present

## 2017-09-29 DIAGNOSIS — Z811 Family history of alcohol abuse and dependence: Secondary | ICD-10-CM

## 2017-09-29 DIAGNOSIS — K219 Gastro-esophageal reflux disease without esophagitis: Secondary | ICD-10-CM | POA: Diagnosis present

## 2017-09-29 DIAGNOSIS — Z590 Homelessness: Secondary | ICD-10-CM

## 2017-09-29 DIAGNOSIS — E872 Acidosis, unspecified: Secondary | ICD-10-CM

## 2017-09-29 DIAGNOSIS — Z8659 Personal history of other mental and behavioral disorders: Secondary | ICD-10-CM

## 2017-09-29 DIAGNOSIS — Z681 Body mass index (BMI) 19 or less, adult: Secondary | ICD-10-CM

## 2017-09-29 DIAGNOSIS — E875 Hyperkalemia: Secondary | ICD-10-CM | POA: Diagnosis present

## 2017-09-29 DIAGNOSIS — F1210 Cannabis abuse, uncomplicated: Secondary | ICD-10-CM

## 2017-09-29 DIAGNOSIS — F17210 Nicotine dependence, cigarettes, uncomplicated: Secondary | ICD-10-CM | POA: Diagnosis present

## 2017-09-29 DIAGNOSIS — E43 Unspecified severe protein-calorie malnutrition: Secondary | ICD-10-CM | POA: Diagnosis present

## 2017-09-29 DIAGNOSIS — R319 Hematuria, unspecified: Secondary | ICD-10-CM | POA: Diagnosis present

## 2017-09-29 DIAGNOSIS — R110 Nausea: Secondary | ICD-10-CM

## 2017-09-29 DIAGNOSIS — R112 Nausea with vomiting, unspecified: Secondary | ICD-10-CM | POA: Diagnosis present

## 2017-09-29 DIAGNOSIS — M62838 Other muscle spasm: Secondary | ICD-10-CM | POA: Diagnosis present

## 2017-09-29 DIAGNOSIS — G9340 Encephalopathy, unspecified: Secondary | ICD-10-CM | POA: Diagnosis present

## 2017-09-29 DIAGNOSIS — R8290 Unspecified abnormal findings in urine: Secondary | ICD-10-CM | POA: Diagnosis present

## 2017-09-29 DIAGNOSIS — M6282 Rhabdomyolysis: Secondary | ICD-10-CM | POA: Diagnosis present

## 2017-09-29 LAB — BASIC METABOLIC PANEL
ANION GAP: 18 — AB (ref 5–15)
BUN: 67 mg/dL — AB (ref 6–20)
CALCIUM: 7.8 mg/dL — AB (ref 8.9–10.3)
CO2: 20 mmol/L — ABNORMAL LOW (ref 22–32)
CREATININE: 5.6 mg/dL — AB (ref 0.61–1.24)
Chloride: 95 mmol/L — ABNORMAL LOW (ref 101–111)
GFR calc Af Amer: 13 mL/min — ABNORMAL LOW (ref >60)
GFR, EST NON AFRICAN AMERICAN: 11 mL/min — AB (ref >60)
GLUCOSE: 145 mg/dL — AB (ref 65–99)
POTASSIUM: 6.4 mmol/L — AB (ref 3.5–5.1)
Sodium: 133 mmol/L — ABNORMAL LOW (ref 135–145)

## 2017-09-29 LAB — URINALYSIS, ROUTINE W REFLEX MICROSCOPIC
Bilirubin Urine: NEGATIVE
GLUCOSE, UA: NEGATIVE mg/dL
KETONES UR: NEGATIVE mg/dL
LEUKOCYTES UA: NEGATIVE
NITRITE: NEGATIVE
PH: 5 (ref 5.0–8.0)
PROTEIN: 30 mg/dL — AB
Specific Gravity, Urine: 1.012 (ref 1.005–1.030)

## 2017-09-29 LAB — COMPREHENSIVE METABOLIC PANEL
ALBUMIN: 5.7 g/dL — AB (ref 3.5–5.0)
ALT: 36 U/L (ref 17–63)
AST: 131 U/L — AB (ref 15–41)
Alkaline Phosphatase: 81 U/L (ref 38–126)
Anion gap: 25 — ABNORMAL HIGH (ref 5–15)
BILIRUBIN TOTAL: 1.1 mg/dL (ref 0.3–1.2)
BUN: 67 mg/dL — AB (ref 6–20)
CO2: 18 mmol/L — ABNORMAL LOW (ref 22–32)
CREATININE: 6.38 mg/dL — AB (ref 0.61–1.24)
Calcium: 9.6 mg/dL (ref 8.9–10.3)
Chloride: 92 mmol/L — ABNORMAL LOW (ref 101–111)
GFR calc Af Amer: 11 mL/min — ABNORMAL LOW (ref >60)
GFR calc non Af Amer: 9 mL/min — ABNORMAL LOW (ref >60)
GLUCOSE: 193 mg/dL — AB (ref 65–99)
Potassium: 6.6 mmol/L (ref 3.5–5.1)
Sodium: 135 mmol/L (ref 135–145)
TOTAL PROTEIN: 10.1 g/dL — AB (ref 6.5–8.1)

## 2017-09-29 LAB — CBC WITH DIFFERENTIAL/PLATELET
BASOS ABS: 0 10*3/uL (ref 0.0–0.1)
BASOS PCT: 0 %
Eosinophils Absolute: 0 10*3/uL (ref 0.0–0.7)
Eosinophils Relative: 0 %
HEMATOCRIT: 55.2 % — AB (ref 39.0–52.0)
HEMOGLOBIN: 20.4 g/dL — AB (ref 13.0–17.0)
LYMPHS PCT: 5 %
Lymphs Abs: 0.7 10*3/uL (ref 0.7–4.0)
MCH: 30.2 pg (ref 26.0–34.0)
MCHC: 37 g/dL — ABNORMAL HIGH (ref 30.0–36.0)
MCV: 81.8 fL (ref 78.0–100.0)
MONO ABS: 1.1 10*3/uL — AB (ref 0.1–1.0)
Monocytes Relative: 8 %
NEUTROS ABS: 12.7 10*3/uL — AB (ref 1.7–7.7)
NEUTROS PCT: 88 %
Platelets: 217 10*3/uL (ref 150–400)
RBC: 6.75 MIL/uL — AB (ref 4.22–5.81)
RDW: 13.6 % (ref 11.5–15.5)
WBC: 14.5 10*3/uL — ABNORMAL HIGH (ref 4.0–10.5)

## 2017-09-29 LAB — CREATININE, SERUM
CREATININE: 5.42 mg/dL — AB (ref 0.61–1.24)
GFR, EST AFRICAN AMERICAN: 13 mL/min — AB (ref >60)
GFR, EST NON AFRICAN AMERICAN: 11 mL/min — AB (ref >60)

## 2017-09-29 LAB — CBC
HCT: 50.5 % (ref 39.0–52.0)
HEMOGLOBIN: 18.5 g/dL — AB (ref 13.0–17.0)
MCH: 28.9 pg (ref 26.0–34.0)
MCHC: 36.6 g/dL — ABNORMAL HIGH (ref 30.0–36.0)
MCV: 78.9 fL (ref 78.0–100.0)
PLATELETS: 207 10*3/uL (ref 150–400)
RBC: 6.4 MIL/uL — AB (ref 4.22–5.81)
WBC: 14 10*3/uL — AB (ref 4.0–10.5)

## 2017-09-29 LAB — LIPASE, BLOOD: Lipase: 31 U/L (ref 11–51)

## 2017-09-29 LAB — BLOOD GAS, VENOUS
ACID-BASE DEFICIT: 6.9 mmol/L — AB (ref 0.0–2.0)
BICARBONATE: 20 mmol/L (ref 20.0–28.0)
O2 SAT: 42.5 %
Patient temperature: 98.6
pCO2, Ven: 45.4 mmHg (ref 44.0–60.0)
pH, Ven: 7.267 (ref 7.250–7.430)
pO2, Ven: 31.5 mmHg — CL (ref 32.0–45.0)

## 2017-09-29 LAB — CK: Total CK: 3673 U/L — ABNORMAL HIGH (ref 49–397)

## 2017-09-29 LAB — OSMOLALITY: Osmolality: 311 mOsm/kg — ABNORMAL HIGH (ref 275–295)

## 2017-09-29 LAB — MAGNESIUM: MAGNESIUM: 2.5 mg/dL — AB (ref 1.7–2.4)

## 2017-09-29 MED ORDER — SODIUM BICARBONATE 8.4 % IV SOLN
50.0000 meq | Freq: Once | INTRAVENOUS | Status: AC
  Administered 2017-09-29: 50 meq via INTRAVENOUS
  Filled 2017-09-29: qty 50

## 2017-09-29 MED ORDER — LORAZEPAM 2 MG/ML IJ SOLN
1.0000 mg | Freq: Once | INTRAMUSCULAR | Status: AC
  Administered 2017-09-29: 1 mg via INTRAVENOUS
  Filled 2017-09-29: qty 1

## 2017-09-29 MED ORDER — SODIUM CHLORIDE 0.9 % IV BOLUS (SEPSIS)
3000.0000 mL | Freq: Once | INTRAVENOUS | Status: AC
  Administered 2017-09-29 (×2): 1000 mL via INTRAVENOUS

## 2017-09-29 MED ORDER — PROMETHAZINE HCL 25 MG/ML IJ SOLN
25.0000 mg | Freq: Once | INTRAMUSCULAR | Status: AC
  Administered 2017-09-29: 25 mg via INTRAVENOUS
  Filled 2017-09-29: qty 1

## 2017-09-29 MED ORDER — SODIUM CHLORIDE 0.9 % IV SOLN: INTRAVENOUS | Status: DC

## 2017-09-29 MED ORDER — SODIUM POLYSTYRENE SULFONATE 15 GM/60ML PO SUSP
30.0000 g | Freq: Once | ORAL | Status: AC
  Administered 2017-09-29: 30 g via ORAL
  Filled 2017-09-29: qty 120

## 2017-09-29 MED ORDER — SODIUM CHLORIDE 0.9 % IV SOLN
1.0000 g | Freq: Once | INTRAVENOUS | Status: AC
  Administered 2017-09-29: 1 g via INTRAVENOUS
  Filled 2017-09-29: qty 10

## 2017-09-29 MED ORDER — HEPARIN SODIUM (PORCINE) 5000 UNIT/ML IJ SOLN
5000.0000 [IU] | Freq: Three times a day (TID) | INTRAMUSCULAR | Status: DC
  Administered 2017-09-29 – 2017-10-01 (×6): 5000 [IU] via SUBCUTANEOUS
  Filled 2017-09-29 (×6): qty 1

## 2017-09-29 MED ORDER — SODIUM CHLORIDE 0.9 % IV BOLUS (SEPSIS)
1000.0000 mL | Freq: Once | INTRAVENOUS | Status: AC
  Administered 2017-09-29: 1000 mL via INTRAVENOUS

## 2017-09-29 MED ORDER — DEXTROSE 5 % IV SOLN
INTRAVENOUS | Status: DC
  Administered 2017-09-29 – 2017-10-01 (×5): via INTRAVENOUS
  Filled 2017-09-29 (×6): qty 150

## 2017-09-29 MED ORDER — ACETAMINOPHEN 325 MG PO TABS: 650.0000 mg | ORAL_TABLET | Freq: Four times a day (QID) | ORAL | Status: DC | PRN

## 2017-09-29 MED ORDER — ACETAMINOPHEN 650 MG RE SUPP: 650.0000 mg | Freq: Four times a day (QID) | RECTAL | Status: DC | PRN

## 2017-09-29 NOTE — ED Provider Notes (Signed)
[name] DEPT Provider Note   CSN: [id number] Arrival date & time: 09/29/17  1055     History   Chief Complaint Chief Complaint  Patient presents with  . Spasms    HPI Henry Kramer is a 46 y.o. male.  HPI 46 year old male with past medical history significant for homelessness, substance abuse presents to the emergency department today for evaluation of muscle spasms.  Patient states that his muscle spasms started last night and they were generalized.  States every time he moves a muscle it feels like it is cramping up.  The patient states that he has not been eating or drink anything for the past couple of days due to not feeling well overall.  Reports some nausea but denies any emesis.  Patient feels that he may be dehydrated.  Patient has been seen in the ED years ago for dehydration with acute kidney injury.  Patient does not have any medical problems.  Takes no medications regularly. He is not followed by a primary care doctor.  Patient denies any other associated complaints.  Does report smoking marijuana 1 week ago but denies any other drug use including IV drug use or alcohol ingestion.  Pt denies any fever, chill, ha, vision changes, lightheadedness, dizziness, congestion, neck pain, cp, sob, cough, abd pain, urinary symptoms, change in bowel habits, melena, hematochezia, lower extremity paresthesias.  Past Medical History:  Diagnosis Date  . AKI (acute kidney injury) ([place]) 05/22/2014  . Chronic nausea   . GERD (gastroesophageal reflux disease)   . Homelessness   . Substance abuse ([place])    tobacco, marijuana, EtOH    Patient Active Problem List   Diagnosis Date Noted  . ARF (acute renal failure) ([place]) 09/29/2017  . Metabolic acidosis 88/50/2774  . Marijuana abuse 09/29/2017  . Rhabdomyolysis 09/29/2017  . Hyperkalemia 09/29/2017  . Benign prostatic hyperplasia with nocturia 09/06/2016  . Healthcare maintenance 09/06/2016  .  Hematuria 07/30/2016  . Abnormal CT of liver 07/30/2016  . Protein-calorie malnutrition, severe 10/29/2015  . AKI (acute kidney injury) ([place]) 05/22/2014  . Chronic nausea 04/21/2014  . Smoking 04/21/2014  . History of substance abuse 04/21/2014    Past Surgical History:  Procedure Laterality Date  . NO PAST SURGERIES         Home Medications    Prior to Admission medications   Medication Sig Start Date End Date Taking? Authorizing Provider  pantoprazole (PROTONIX) 40 MG tablet Take 1 tablet (40 mg total) by mouth daily. Patient not taking: Reported on 09/29/2017 09/04/16 09/04/17  Jule Ser, DO  tamsulosin [facility]) 0.4 MG CAPS capsule Take 1 capsule (0.4 mg total) by mouth daily after breakfast. Patient not taking: Reported on 09/29/2017 09/04/16   Jule Ser, DO    Family History Family History  Problem Relation Age of Onset  . Cancer Mother   . Alcohol abuse Father   . Cirrhosis Father   . Cancer Sister   . Diabetes Brother   . Obesity Brother     Social History Social History   Tobacco Use  . Smoking status: Current Every Day Smoker    Packs/day: 0.50    Years: 22.00    Pack years: 11.00    Types: Cigarettes  . Smokeless tobacco: Never Used  Substance Use Topics  . Alcohol use: Yes    Comment: "occasional".    . Drug use: Yes    Types: Marijuana    Comment: smokes THC 2-3 times per week.  Allergies   Patient has no known allergies.   Review of Systems Review of Systems  Constitutional: Negative for chills and fever.  HENT: Negative for congestion and sore throat.   Eyes: Negative for visual disturbance.  Respiratory: Negative for cough and shortness of breath.   Cardiovascular: Negative for chest pain.  Gastrointestinal: Positive for nausea and vomiting. Negative for abdominal pain and diarrhea.  Genitourinary: Negative for dysuria, flank pain, frequency, hematuria, scrotal swelling, testicular pain and urgency.  Musculoskeletal:  Positive for myalgias. Negative for arthralgias.  Skin: Negative for rash.  Neurological: Negative for dizziness, syncope, weakness, light-headedness, numbness and headaches.  Psychiatric/Behavioral: Negative for sleep disturbance. The patient is not nervous/anxious.      Physical Exam Updated Vital Signs BP 111/69 (BP Location: Left Arm)   Pulse 90   Temp 98 F (36.7 C) (Oral)   Resp 20   Ht 6\' 3"  (1.905 m)   Wt 72.6 kg (160 lb)   SpO2 100%   BMI 20.00 kg/m   Physical Exam  Constitutional: He is oriented to person, place, and time. He appears well-developed and well-nourished.  Non-toxic appearance. No distress.  HENT:  Head: Normocephalic and atraumatic.  Mucous membranes dry.  Eyes: Conjunctivae are normal. Pupils are equal, round, and reactive to light. Right eye exhibits no discharge. Left eye exhibits no discharge.  Neck: Normal range of motion. Neck supple.  Cardiovascular: Normal rate, regular rhythm, normal heart sounds and intact distal pulses. Exam reveals no gallop and no friction rub.  No murmur heard. Pulmonary/Chest: Effort normal and breath sounds normal. No stridor. No respiratory distress. He has no wheezes. He has no rales. He exhibits no tenderness.  Abdominal: Soft. Bowel sounds are normal. He exhibits no distension. There is no tenderness. There is no rigidity, no rebound, no guarding, no CVA tenderness, no tenderness at McBurney's point and negative Murphy's sign.  Musculoskeletal: Normal range of motion. He exhibits no tenderness.  Lymphadenopathy:    He has no cervical adenopathy.  Neurological: He is alert and oriented to person, place, and time.  No clonus noted.  Muscle spasms noted.  Patient very rigid.  Skin: Skin is warm and dry. Capillary refill takes less than 2 seconds. No rash noted.  Psychiatric: His behavior is normal. Judgment and thought content normal.  Nursing note and vitals reviewed.    ED Treatments / Results  Labs (all labs  ordered are listed, but only abnormal results are displayed) Labs Reviewed  COMPREHENSIVE METABOLIC PANEL - Abnormal; Notable for the following components:      Result Value   Potassium 6.6 (*)    Chloride 92 (*)    CO2 18 (*)    Glucose, Bld 193 (*)    BUN 67 (*)    Creatinine, Ser 6.38 (*)    Total Protein 10.1 (*)    Albumin 5.7 (*)    AST 131 (*)    GFR calc non Af Amer 9 (*)    GFR calc Af Amer 11 (*)    Anion gap 25 (*)    All other components within normal limits  CBC WITH DIFFERENTIAL/PLATELET - Abnormal; Notable for the following components:   WBC 14.5 (*)    RBC 6.75 (*)    Hemoglobin 20.4 (*)    HCT 55.2 (*)    MCHC 37.0 (*)    Neutro Abs 12.7 (*)    Monocytes Absolute 1.1 (*)    All other components within normal limits  CK - Abnormal;  Notable for the following components:   Total CK 3,673 (*)    All other components within normal limits  BLOOD GAS, VENOUS - Abnormal; Notable for the following components:   pO2, Ven 31.5 (*)    Acid-base deficit 6.9 (*)    All other components within normal limits  LIPASE, BLOOD  URINALYSIS, ROUTINE W REFLEX MICROSCOPIC  BASIC METABOLIC PANEL  OSMOLALITY  VOLATILES,BLD-ACETONE,ETHANOL,ISOPROP,METHANOL  ETHYLENE GLYCOL  HIV ANTIBODY (ROUTINE TESTING)  CBC  CREATININE, SERUM  MAGNESIUM    EKG  EKG Interpretation None       Radiology No results found.  Procedures .Critical Care Performed by: Doristine Devoid, PA-C Authorized by: Doristine Devoid, PA-C   Critical care provider statement:    Critical care time (minutes):  45   Critical care was necessary to treat or prevent imminent or life-threatening deterioration of the following conditions: Hyperkalemia, ARF.   Critical care was time spent personally by me on the following activities:  Development of treatment plan with patient or surrogate, discussions with consultants, discussions with primary provider, examination of patient, evaluation of patient's  response to treatment, ordering and performing treatments and interventions, ordering and review of laboratory studies, ordering and review of radiographic studies, pulse oximetry, re-evaluation of patient's condition and review of old charts   (including critical care time)  Medications Ordered in ED Medications  sodium bicarbonate 150 mEq in dextrose 5 % 1,000 mL infusion (not administered)  heparin injection 5,000 Units (not administered)  0.9 %  sodium chloride infusion (not administered)  acetaminophen (TYLENOL) tablet 650 mg (not administered)    Or  acetaminophen (TYLENOL) suppository 650 mg (not administered)  sodium chloride 0.9 % bolus 1,000 mL (0 mLs Intravenous Stopped 09/29/17 1501)  LORazepam (ATIVAN) injection 1 mg (1 mg Intravenous Given 09/29/17 1421)  sodium chloride 0.9 % bolus 1,000 mL (0 mLs Intravenous Stopped 09/29/17 1538)  promethazine (PHENERGAN) injection 25 mg (25 mg Intravenous Given 09/29/17 1513)  sodium bicarbonate injection 50 mEq (50 mEq Intravenous Given 09/29/17 1539)  calcium gluconate 1 g in sodium chloride 0.9 % 100 mL IVPB (0 g Intravenous Stopped 09/29/17 1634)  sodium polystyrene (KAYEXALATE) 15 GM/60ML suspension 30 g (30 g Oral Given 09/29/17 1539)     Initial Impression / Assessment and Plan / ED Course  I have reviewed the triage vital signs and the nursing notes.  Pertinent labs & imaging results that were available during my care of the patient were reviewed by me and considered in my medical decision making (see chart for details).     Patient presents to the ED with complaints of nausea and muscle spasm that began several days ago.  Patient has no known medical conditions.  Takes no medications regularly.  Patient vital signs are reassuring.  Patient is afebrile, no tachycardia, no hypotension noted.  He is got no focal abdominal tenderness on exam.  Lungs clear to auscultation bilaterally.  Patient is neurovascularly intact in all  extremities.  Mucous membranes are dry.  Appears mildly dehydrated.  Patient appears hemoconcentrated.  Leukocytosis of 14,000.  Hemoglobin is 20.  Patient is hyperkalemic at 6.6.  Creatinine is 6.38.  BUN 67 and albumin is 5.7.  Patient with a anion gap of 24.  CK elevated at 3600.  Lipase is normal.  Patient is not acidotic.  Awaiting UA at this time however patient has not been able to provide a urine sample.  EKG was obtained that did not show any ischemic changes.  Borderline prolonged QT.  No peak T waves.  Given patient's acute elevated creatinine of 6.3 a concern for acute renal failure.  Patient was given sodium bicarb, calcium gluconate and Kayexalate for the hyperkalemia.  Patient given several fluid boluses.  Patient was given Ativan to help with muscle spasms.  Unknown etiology of patient's symptoms.  Patient adamantly denies any ingestion.  However volatiles and ethylene glycol was sent.  Spoke with Dr. Melvia Heaps with nephrology.  He will come to the ED to evaluate patient and has no further recommendations at this time.  Spoke with Dr. Earlie Counts with hospital medicine for hospital admission.  Agrees to admission will place admission orders.  Patient remains hemodynamically stable at this time.  He is resting comfortably in the bed.  Vital signs remained reassuring.  He was updated on plan of care.  Patient was discussed with my attending Dr. tegeler who is agreeable the above plan.    Final Clinical Impressions(s) / ED Diagnoses   Final diagnoses:  Acute renal failure, unspecified acute renal failure type [facility])  Muscle spasm    ED Discharge Orders    None       Aaron Edelman 09/29/17 1700    Tegeler, Gwenyth Allegra, MD 09/29/17 2039

## 2017-09-29 NOTE — Care Management Note (Signed)
Case Management Note  CM consulted for medication assistance.  Pt's mailing address is listed as the [facility] and Marliss Coots, NP has reached out to him for follow up appointments with no response.  Pt can get assistance from Audrea Muscat with medications if he goes to see her.  Placed her information for a reminder on AVS.  Inpt CM can follow for additional needs.

## 2017-09-29 NOTE — Progress Notes (Signed)
Pt arrived to unit on stretcher, slid self to bed. IVF intiated. VSS. Oriented to callbell and environment. Will report off to oncoming RN.

## 2017-09-29 NOTE — Consult Note (Signed)
Renal Service Consult Note [facility]  JAMESEN STAHNKE 09/29/2017 [place] [address] Requesting Physician:  Dr. Sherry Ruffing  Reason for Consult:  Acute renal failure HPI: The patient is a 46 y.o. year-old with hx of homelessness,GERD, AKI, chronic nausea presented to ED c/o muscle spasms in both legs that started last night.  In ED labs showed Cr of 6.38, K+ 6.6.  BUN 67, CO2 18.  Pt has hx of AKI in the past with peak creat around 2.0, and baseline creat around 1.0.  Asked to see for AKI.   Pt is vague historian but answers most questions appropriately.  Denies and suicidal ideation, drinking antifreeze or wood alcohol. No nsaid use.     Pt says he has been sick since around Nov 1st, which means vomiting.  More than 1x per day.  Says he gets this from time to time.  +abd pain, lower abdomen.  No chills or fevers, no diarreha.  No voiding changes.  No CP , SOB or HA.    No hx DM, HTN, no surgical hx.  Denies IV drug use, other drug abuse , denies having sex w men.     ROS  denies CP  no joint pain   no HA  no blurry vision  no rash  no diarrhea  no nausea/ vomiting  no dysuria  no difficulty voiding  no change in urine color    Past Medical History  Past Medical History:  Diagnosis Date  . AKI (acute kidney injury) ([place]) 05/22/2014  . Chronic nausea   . GERD (gastroesophageal reflux disease)   . Homelessness   . Substance abuse ([place])    tobacco, marijuana, EtOH   Past Surgical History  Past Surgical History:  Procedure Laterality Date  . NO PAST SURGERIES     Family History  Family History  Problem Relation Age of Onset  . Cancer Mother   . Alcohol abuse Father   . Cirrhosis Father   . Cancer Sister   . Diabetes Brother   . Obesity Brother    Social History  reports that he has been smoking cigarettes.  He has a 11.00 pack-year smoking history. he has never used smokeless tobacco. He reports that he drinks alcohol. He reports that he uses drugs.  Drug: Marijuana. Allergies No Known Allergies Home medications Prior to Admission medications   Medication Sig Start Date End Date Taking? Authorizing Provider  pantoprazole (PROTONIX) 40 MG tablet Take 1 tablet (40 mg total) by mouth daily. Patient not taking: Reported on 09/29/2017 09/04/16 09/04/17  Jule Ser, DO  tamsulosin [facility]) 0.4 MG CAPS capsule Take 1 capsule (0.4 mg total) by mouth daily after breakfast. Patient not taking: Reported on 09/29/2017 09/04/16   Jule Ser, DO   Liver Function Tests Recent Labs  Lab 09/29/17 1355  AST 131*  ALT 36  ALKPHOS 81  BILITOT 1.1  PROT 10.1*  ALBUMIN 5.7*   Recent Labs  Lab 09/29/17 1355  LIPASE 31   CBC Recent Labs  Lab 09/29/17 1355  WBC 14.5*  NEUTROABS 12.7*  HGB 20.4*  HCT 55.2*  MCV 81.8  PLT 956   Basic Metabolic Panel Recent Labs  Lab 09/29/17 1355  NA 135  K 6.6*  CL 92*  CO2 18*  GLUCOSE 193*  BUN 67*  CREATININE 6.38*  CALCIUM 9.6   Iron/TIBC/Ferritin/ %Sat No results found for: IRON, TIBC, FERRITIN, IRONPCTSAT  Vitals:   09/29/17 1102 09/29/17 1244 09/29/17 1513  BP: 93/71 115/87  111/69  Pulse: 100 90 90  Resp: 18 20 20   Temp: 98 F (36.7 C)    TempSrc: Oral    SpO2: 100% 99% 100%  Weight:  72.6 kg (160 lb)   Height:  6\' 3"  (1.905 m)    Exam Gen tired AAM, thin, a bit cachectic perhaps No rash, cyanosis or gangrene Sclera anicteric, throat clear and very dry  No jvd or bruits, flat neck veins Chest clear bilat RRR no MRG Abd soft ntnd no mass or ascites +bs, scaphoid GU normal male MS no joint effusions or deformity Ext no LE edema / no wounds or ulcers Neuro is lethargic, responding to questions appropriately     Impression: 1. Acute kidney injury - suspect due to severe vol depletion from repetitive N/V.  Social situation may be poor, hx of homelessness.  He is severely hemo-concentrated.  Tprot high but this is likely due to hemoconcentration as well.  Repeat  these labs after hydration completed. Not anemic or hyperCa to suggest myeloma.  May have some mild rhabdo, CPK 3700.  No low plts.  Would send off ethylene glycol and methanol.  Should improved rapidly with IVF"s.  Get renal US.  Will follow.  2. Severe vol depletion 3. Metabolic acidosis +AG -suspect due to AKI.  Denies any kind of suicide attempt.  4. Hyperkalemia - given Kayex x 30 gm, would repeat until K < 6 then no need further kayexalate.     Plan - as above  Kelly Splinter MD [facility] pager [phone]   09/29/2017, 4:34 PM

## 2017-09-29 NOTE — ED Notes (Signed)
Call report to [facility] [phone] @ 1730

## 2017-09-29 NOTE — ED Triage Notes (Addendum)
Per [facility] patient from home for muscle spasms that started last night that are generalized. Patient reports that he hasnt been eating past couple days. Vitals 118/78, 103HR, 18HR, 92% on room air, CBG 217.

## 2017-09-29 NOTE — H&P (Signed)
History and Physical    Henry Kramer HGD:[id number] DOB: Jan 16, 1971 DOA: 09/29/2017  PCP: Jule Ser, DO  Patient coming from: Home  Chief Complaint: Cramps  HPI: Henry Kramer is a 46 y.o. male with medical history significant of polysubstance abuse, reported chronic nausea, homelessness who presents to the ED with complaints of cramping. Patient appears mildly encephalopathic at this time, thus detailed history cannot be obtained from patient. Pt was able to report having chronic nausea and decreased PO intake. Pt states cramping began 3 days prior to hospital admission.   ED Course: In the ED, patient was found to have potassium of 6.6 with serum bicarb of 18 and Cr of 6.38 with BUN of 67. WBC noted to be 14.5 with hgb of 20.4. Patient was given 2L NS boluses, calcium gluconate, bicarb, and kayexelate. Nephrology was consulted. Hospitalist service was consulted for consideration for admission.  Review of Systems:  Review of Systems  Unable to perform ROS: Mental acuity    Past Medical History:  Diagnosis Date  . AKI (acute kidney injury) ([place]) 05/22/2014  . Chronic nausea   . GERD (gastroesophageal reflux disease)   . Homelessness   . Substance abuse ([place])    tobacco, marijuana, EtOH    Past Surgical History:  Procedure Laterality Date  . NO PAST SURGERIES       reports that he has been smoking cigarettes.  He has a 11.00 pack-year smoking history. he has never used smokeless tobacco. He reports that he drinks alcohol. He reports that he uses drugs. Drug: Marijuana.  No Known Allergies  Family History  Problem Relation Age of Onset  . Cancer Mother   . Alcohol abuse Father   . Cirrhosis Father   . Cancer Sister   . Diabetes Brother   . Obesity Brother     Prior to Admission medications   Medication Sig Start Date End Date Taking? Authorizing Provider  pantoprazole (PROTONIX) 40 MG tablet Take 1 tablet (40 mg total) by mouth daily. Patient not taking:  Reported on 09/29/2017 09/04/16 09/04/17  Jule Ser, DO  tamsulosin [facility]) 0.4 MG CAPS capsule Take 1 capsule (0.4 mg total) by mouth daily after breakfast. Patient not taking: Reported on 09/29/2017 09/04/16   Jule Ser, DO    Physical Exam: Vitals:   09/29/17 1102 09/29/17 1244 09/29/17 1513  BP: 93/71 115/87 111/69  Pulse: 100 90 90  Resp: 18 20 20   Temp: 98 F (36.7 C)    TempSrc: Oral    SpO2: 100% 99% 100%  Weight:  72.6 kg (160 lb)   Height:  6\' 3"  (1.905 m)     Constitutional: NAD, calm, comfortable Vitals:   09/29/17 1102 09/29/17 1244 09/29/17 1513  BP: 93/71 115/87 111/69  Pulse: 100 90 90  Resp: 18 20 20   Temp: 98 F (36.7 C)    TempSrc: Oral    SpO2: 100% 99% 100%  Weight:  72.6 kg (160 lb)   Height:  6\' 3"  (1.905 m)    Eyes: PERRL, lids and conjunctivae normal ENMT: Mucous membranes are moist. Posterior pharynx clear of any exudate or lesions.Normal dentition.  Neck: normal, supple, no masses, no thyromegaly Respiratory: clear to auscultation bilaterally, no wheezing, no crackles. Normal respiratory effort. No accessory muscle use.  Cardiovascular: Regular rate and rhythm, no murmurs / rubs / gallops. No extremity edema. 2+ pedal pulses. No carotid bruits.  Abdomen: no tenderness, no masses palpated. No hepatosplenomegaly. Decreased BS.  Musculoskeletal: no clubbing /  cyanosis. No joint deformity upper and lower extremities. Good ROM, no contractures. Normal muscle tone.  Skin: no rashes, lesions, ulcers. No induration Neurologic: CN 2-12 grossly intact. Difficult to fully assess given current mental state Psychiatric: difficult to assess given current mental state   Labs on Admission: I have personally reviewed following labs and imaging studies  CBC: Recent Labs  Lab 09/29/17 1355  WBC 14.5*  NEUTROABS 12.7*  HGB 20.4*  HCT 55.2*  MCV 81.8  PLT 920   Basic Metabolic Panel: Recent Labs  Lab 09/29/17 1355  NA 135  K 6.6*  CL  92*  CO2 18*  GLUCOSE 193*  BUN 67*  CREATININE 6.38*  CALCIUM 9.6   GFR: Estimated Creatinine Clearance: 14.7 mL/min (A) (by C-G formula based on SCr of 6.38 mg/dL (H)). Liver Function Tests: Recent Labs  Lab 09/29/17 1355  AST 131*  ALT 36  ALKPHOS 81  BILITOT 1.1  PROT 10.1*  ALBUMIN 5.7*   Recent Labs  Lab 09/29/17 1355  LIPASE 31   No results for input(s): AMMONIA in the last 168 hours. Coagulation Profile: No results for input(s): INR, PROTIME in the last 168 hours. Cardiac Enzymes: Recent Labs  Lab 09/29/17 1355  CKTOTAL 3,673*   BNP (last 3 results) No results for input(s): PROBNP in the last 8760 hours. HbA1C: No results for input(s): HGBA1C in the last 72 hours. CBG: No results for input(s): GLUCAP in the last 168 hours. Lipid Profile: No results for input(s): CHOL, HDL, LDLCALC, TRIG, CHOLHDL, LDLDIRECT in the last 72 hours. Thyroid Function Tests: No results for input(s): TSH, T4TOTAL, FREET4, T3FREE, THYROIDAB in the last 72 hours. Anemia Panel: No results for input(s): VITAMINB12, FOLATE, FERRITIN, TIBC, IRON, RETICCTPCT in the last 72 hours. Urine analysis:    Component Value Date/Time   COLORURINE AMBER (A) 02/28/2016 2011   APPEARANCEUR Clear 07/30/2016 1522   LABSPEC 1.018 02/28/2016 2011   PHURINE 5.0 02/28/2016 2011   GLUCOSEU Negative 07/30/2016 1522   HGBUR LARGE (A) 02/28/2016 2011   BILIRUBINUR Negative 07/30/2016 [place] 02/28/2016 2011   PROTEINUR Negative 07/30/2016 1522   PROTEINUR 30 (A) 02/28/2016 2011   UROBILINOGEN 0.2 01/15/2015 1821   NITRITE Negative 07/30/2016 1522   NITRITE NEGATIVE 02/28/2016 2011   LEUKOCYTESUR Negative 07/30/2016 1522   Sepsis Labs: !!!!!!!!!!!!!!!!!!!!!!!!!!!!!!!!!!!!!!!!!!!! @LABRCNTIP (procalcitonin:4,lacticidven:4) )No results found for this or any previous visit (from the past 240 hour(s)).   Radiological Exams on Admission: No results found. MRI abd from 2017 reviewed.  2 small benign hepatic hemangiomas noted  Assessment/Plan Principal Problem:   ARF (acute renal failure) (HCC) Active Problems:   Metabolic acidosis   Marijuana abuse   Rhabdomyolysis   Hyperkalemia   1. ARF 1. Suspect pre-renal in etiology given elevated BUN 2. Question of hx of nausea and anorexia leading up to admission 3. Pt also noted to have elevated CK  4. Clinically appears dehydrated. 5. Will continue patient on IVF hydration 6. Repeat bmet in AM 7. Nephrology consulted through ED 8. Will admit to med-tele, continue to monitor on tele given electrolyte abnormalities  2. Anion gap Metabolic acidosis 1. Suspect secondary to presenting renal failure 2. Continue IVF per above 3. Repeat bmet in AM 3. Rhabdomyolysis 1. Presenting CK in excess of 3,000 2. IVF per above 3. Recheck CK in AM 4. Hyperkalemia 1. Bicarb, Calcium, kayexelate given in ED 2. Repeat BMET 5. Nausea, anorexia 1. Decreased BS on exam 2. Will check abd xray  DVT  prophylaxis: heparin subQ  Code Status: Full Family Communication: Pt in room  Disposition Plan: Uncertain at this time  Consults called: Nephrology (Dr. Jonnie Finner) Admission status: Inpatient, as would likely require greater than 2 midnight stay to resolve renal failure   CHIU, Orpah Melter MD [facility] Pager [phone]  If 7PM-7AM, please contact night-coverage [email or web address] Password [email or web address]  09/29/2017, 4:31 PM

## 2017-09-30 LAB — HIV ANTIBODY (ROUTINE TESTING W REFLEX): HIV Screen 4th Generation wRfx: NONREACTIVE

## 2017-09-30 LAB — COMPREHENSIVE METABOLIC PANEL
ALBUMIN: 3.7 g/dL (ref 3.5–5.0)
ALK PHOS: 49 U/L (ref 38–126)
ALT: 51 U/L (ref 17–63)
AST: 239 U/L — AB (ref 15–41)
Anion gap: 10 (ref 5–15)
BILIRUBIN TOTAL: 1 mg/dL (ref 0.3–1.2)
BUN: 62 mg/dL — AB (ref 6–20)
CO2: 29 mmol/L (ref 22–32)
CREATININE: 2.91 mg/dL — AB (ref 0.61–1.24)
Calcium: 7.4 mg/dL — ABNORMAL LOW (ref 8.9–10.3)
Chloride: 102 mmol/L (ref 101–111)
GFR calc Af Amer: 28 mL/min — ABNORMAL LOW (ref >60)
GFR, EST NON AFRICAN AMERICAN: 24 mL/min — AB (ref >60)
GLUCOSE: 103 mg/dL — AB (ref 65–99)
POTASSIUM: 3.7 mmol/L (ref 3.5–5.1)
Sodium: 141 mmol/L (ref 135–145)
TOTAL PROTEIN: 6.2 g/dL — AB (ref 6.5–8.1)

## 2017-09-30 LAB — CBC
HEMATOCRIT: 39.6 % (ref 39.0–52.0)
Hemoglobin: 14.1 g/dL (ref 13.0–17.0)
MCH: 28.8 pg (ref 26.0–34.0)
MCHC: 35.6 g/dL (ref 30.0–36.0)
MCV: 80.8 fL (ref 78.0–100.0)
PLATELETS: 158 10*3/uL (ref 150–400)
RBC: 4.9 MIL/uL (ref 4.22–5.81)
RDW: 13.6 % (ref 11.5–15.5)
WBC: 11 10*3/uL — AB (ref 4.0–10.5)

## 2017-09-30 LAB — VOLATILES,BLD-ACETONE,ETHANOL,ISOPROP,METHANOL
ACETONE, BLOOD: NEGATIVE % (ref 0.000–0.010)
Ethanol, blood: NEGATIVE % (ref 0.000–0.010)
Isopropanol, blood: NEGATIVE % (ref 0.000–0.010)
METHANOL, BLOOD: NEGATIVE % (ref 0.000–0.010)

## 2017-09-30 LAB — CREATININE, URINE, RANDOM: Creatinine, Urine: 149.61 mg/dL

## 2017-09-30 LAB — SODIUM, URINE, RANDOM: Sodium, Ur: 36 mmol/L

## 2017-09-30 LAB — ETHYLENE GLYCOL: Ethylene Glycol Lvl: NOT DETECTED mg/dL

## 2017-09-30 LAB — CK: Total CK: 14397 U/L — ABNORMAL HIGH (ref 49–397)

## 2017-09-30 MED ORDER — ADULT MULTIVITAMIN W/MINERALS CH
1.0000 | ORAL_TABLET | Freq: Every day | ORAL | Status: DC
  Administered 2017-09-30 – 2017-10-01 (×2): 1 via ORAL
  Filled 2017-09-30 (×2): qty 1

## 2017-09-30 MED ORDER — ENSURE ENLIVE PO LIQD
237.0000 mL | Freq: Two times a day (BID) | ORAL | Status: DC
  Administered 2017-09-30 – 2017-10-01 (×4): 237 mL via ORAL

## 2017-09-30 NOTE — Progress Notes (Signed)
[facility] PROGRESS NOTE  Henry Kramer QPY:[id number] DOB: Apr 04, 1971 DOA: 09/29/2017 PCP: Jule Ser, DO  Interim summary and HPI 46 y.o. male with medical history significant of polysubstance abuse, reported chronic nausea and homelessness who presents to the ED with complaints of cramping. Patient reported having flare of his chronic nausea, with subsequent vomiting and poor oral intake for over 3-4 days prior to admission. Found very dehydrated with acute renal failure and metabolic acidosis.   Assessment/Plan: 1-ARF: appears to be pre-renal in nature from dehydration and poor oral intake. Also with elevated CK level there is suggestion of rhabdomyolysis playing a role in renal failure as well. -will continue IVF's -follow renal function  -renal US w/o obstructive uropathy -nephrology on board, will follow rec's -patient is feeling better and Cr is trending down  2-metabolic acidosis -due to renal failure -improving/resolved with IVF's and improvement in renal function  -Anion gap is 10  3-rhabdomyolysis -continue IVF's -CK is going up  4-hyperkalemia -Due to renal failure -patient received bicarb, calcium gluconate and kayexalate -K is 3.7 today -will follow trend  5-nausea/vomiting and anorexia -continue PRN antiemetics -continue advancing diet -no abnormalities seen on abd x-ray  Code Status: Full Family Communication:  Disposition Plan: remains inpatient, will continue IVF's, follow renal function trend and CK level.   Consultants:  Renal service.  Procedures:  See below for x-ray reports   Antibiotics:  None   HPI/Subjective: Afebrile, no CP, no SOB, no abd pain and no vomiting. Still with mild intermittent nausea and poor appetite.  Objective: Vitals:   09/30/17 0504 09/30/17 1345  BP: 104/69 128/79  Pulse: 71 80  Resp: 16   Temp: 98.1 F (36.7 C) 98.5 F (36.9 C)  SpO2: 98% 100%    Intake/Output Summary (Last 24 hours) at  09/30/2017 2116 Last data filed at 09/30/2017 1800 Gross per 24 hour  Intake 4285 ml  Output 3750 ml  Net 535 ml   Filed Weights   09/29/17 1244 09/29/17 1909  Weight: 72.6 kg (160 lb) 61.7 kg (136 lb 0.4 oz)    Exam:   General:  Afebrile, no CP, no SOB. Patient reports feeling better and having improvement in his body aches. Appetite is poor and reported mild nausea, no further vomiting.  Cardiovascular: no rubs, no gallops, no murmur, S1 and S2  Respiratory: good air movement, no wheezing, no crackles  Abdomen: soft, NT, ND, positive BS  Musculoskeletal: no edema, no cyanosis   Data Reviewed: Basic Metabolic Panel: Recent Labs  Lab 09/29/17 1355 09/29/17 1614 09/29/17 1703 09/30/17 0443  NA 135 133*  --  141  K 6.6* 6.4*  --  3.7  CL 92* 95*  --  102  CO2 18* 20*  --  29  GLUCOSE 193* 145*  --  103*  BUN 67* 67*  --  62*  CREATININE 6.38* 5.60* 5.42* 2.91*  CALCIUM 9.6 7.8*  --  7.4*  MG  --   --  2.5*  --    Liver Function Tests: Recent Labs  Lab 09/29/17 1355 09/30/17 0443  AST 131* 239*  ALT 36 51  ALKPHOS 81 49  BILITOT 1.1 1.0  PROT 10.1* 6.2*  ALBUMIN 5.7* 3.7   Recent Labs  Lab 09/29/17 1355  LIPASE 31   CBC: Recent Labs  Lab 09/29/17 1355 09/29/17 1703 09/30/17 0443  WBC 14.5* 14.0* 11.0*  NEUTROABS 12.7*  --   --   HGB 20.4* 18.5* 14.1  HCT 55.2* 50.5  39.6  MCV 81.8 78.9 80.8  PLT 217 207 158   Cardiac Enzymes: Recent Labs  Lab 09/29/17 1355 09/30/17 0443  CKTOTAL 3,673* 14,397*    Studies: US Renal  Result Date: 09/29/2017 CLINICAL DATA:  Elevated BUN and creatinine. EXAM: RENAL / URINARY TRACT ULTRASOUND COMPLETE COMPARISON:  MRI from 08/11/2016 FINDINGS: Right Kidney: Length: 10.1 cm. Increased parenchymal echogenicity. No mass or hydronephrosis visualized. Left Kidney: Length: 10.9 cm. Increased parenchymal echogenicity. No mass or hydronephrosis visualized. Bladder: Appears normal for degree of bladder distention.  Other:  Prostate measures 4.7 x 3.7 x 4.5 cm (volume = 41 cm^3) IMPRESSION: 1. No hydronephrosis. 2. Bilateral echogenic kidneys compatible with chronic medical renal disease. Electronically Signed   By: Kerby Moors M.D.   On: 09/29/2017 18:59   Dg Abd Portable 1v  Result Date: 09/29/2017 CLINICAL DATA:  Abdominal pain and vomiting. EXAM: PORTABLE ABDOMEN - 1 VIEW COMPARISON:  Abdominal x-ray dated February 28, 2013. FINDINGS: The bowel gas pattern is normal. No radio-opaque calculi or other significant radiographic abnormality are seen. IMPRESSION: Negative. Electronically Signed   By: Titus Dubin M.D.   On: 09/29/2017 17:18    Scheduled Meds: . feeding supplement (ENSURE ENLIVE)  237 mL Oral BID BM  . heparin  5,000 Units Subcutaneous Q8H  . multivitamin with minerals  1 tablet Oral Daily   Continuous Infusions: .  sodium bicarbonate  infusion 1000 mL 150 mL/hr at 09/30/17 1726    Principal Problem:   ARF (acute renal failure) ([place]) Active Problems:   Metabolic acidosis   Marijuana abuse   Rhabdomyolysis   Hyperkalemia    Time spent: 30 minutes    Barton Dubois  [facility] Pager [phone]. If 7PM-7AM, please contact night-coverage at [email or web address], password [facility] 09/30/2017, 9:16 PM  LOS: 1 day

## 2017-09-30 NOTE — Progress Notes (Signed)
Initial Nutrition Assessment  DOCUMENTATION CODES:   Severe malnutrition in context of social or environmental circumstances  INTERVENTION:   Provide MVI daily  Ensure Enlive po BID, each supplement provides 350 kcal and 20 grams of protein  NUTRITION DIAGNOSIS:   Severe Malnutrition related to social / environmental circumstances(homelessness) as evidenced by energy intake < or equal to 75% for > or equal to 1 month, moderate fat depletion, severe muscle depletion.  GOAL:   Patient will meet greater than or equal to 90% of their needs  MONITOR:   PO intake, Supplement acceptance, Weight trends, Labs  REASON FOR ASSESSMENT:   Malnutrition Screening Tool    ASSESSMENT:   Pt with PMH significant for polysubstance abuse, chronic nausea, and homelessness. Presents this admission with ARF suspected pre-renal etiology, metabolic acidosis, and rhabdomylosis.   Spoke with pt at bedside.  Reports loss in appetite one week PTA due to worsening nausea/vomiting. Pt goes through spells of nausea/vomiting throughout the year.  Typically eats one meal per day at the homeless shelter that typically consist of a meat, vegetable, and grain. Does not consume supplementation. Denies any issues finding food and that he now has his own place where he lives alone.  Pt reports he receives EBT monthly for food.  Denies any alcohol or drug use besides marijuana use occasionally. This does not affect his appetite. Pt reports a UBW of 160 lb, but unsure of when he was at that weight last.  Records indicate pt has lost 5.5% of body weight in one year time frame. This is not significant.  Nutrition-Focused physical exam completed. See findings below.  Pt amendable to Ensure this hospital stay. Will monitor PO intake.   Medications reviewed and include: sodium bicarb @ 150 ml/hr Labs reviewed: BUN 62 (H) Creatinine 2.91 (H) AST 239 (H) Mg 2.5 (H)  NUTRITION - FOCUSED PHYSICAL EXAM:    Most Recent  Value  Orbital Region  Moderate depletion  Upper Arm Region  Moderate depletion  Thoracic and Lumbar Region  Unable to assess  Buccal Region  Moderate depletion  Temple Region  Moderate depletion  Clavicle Bone Region  Severe depletion  Clavicle and Acromion Bone Region  Severe depletion  Scapular Bone Region  Unable to assess  Dorsal Hand  Moderate depletion  Patellar Region  Severe depletion  Anterior Thigh Region  Severe depletion  Posterior Calf Region  Severe depletion  Edema (RD Assessment)  None  Hair  Reviewed  Eyes  Reviewed  Mouth  Reviewed  Skin  Reviewed  Nails  Reviewed       Diet Order:  Diet regular Room service appropriate? Yes; Fluid consistency: Thin  EDUCATION NEEDS:   Education needs have been addressed  Skin:  Skin Assessment: Reviewed RN Assessment  Last BM:  PTA  Height:   Ht Readings from Last 1 Encounters:  09/29/17 6\' 3"  (1.905 m)    Weight:   Wt Readings from Last 1 Encounters:  09/29/17 136 lb 0.4 oz (61.7 kg)    Ideal Body Weight:  89.1 kg  BMI:  Body mass index is 17 kg/m.  Estimated Nutritional Needs:   Kcal:  2300-2500 kcal/day  Protein:  125-135 g/day  Fluid:  > 2.3 L/day    Henry Kramer RD, LDN Clinical Nutrition Pager # - [phone]

## 2017-09-30 NOTE — Progress Notes (Signed)
Report from [place], [place]. Care assumed for pt at 1715. Pt resting in bed w/ blanket over head. Denies any pain/discomfort/needs at present. Agree with AM assessment. Will monitor.

## 2017-09-30 NOTE — Progress Notes (Signed)
[facility] Progress Note  Subjective: creat down to 2.7.  Cramps/ pain in body is about 50% better per pt.   Vitals:   09/29/17 1513 09/29/17 1718 09/29/17 1909 09/30/17 0504  BP: 111/69 112/74 118/75 104/69  Pulse: 90 88 84 71  Resp: 20 18 18 16   Temp:   98.4 F (36.9 C) 98.1 F (36.7 C)  TempSrc:   Oral Oral  SpO2: 100% 100% 100% 98%  Weight:   61.7 kg (136 lb 0.4 oz)   Height:   6\' 3"  (1.905 m)     Inpatient medications: . feeding supplement (ENSURE ENLIVE)  237 mL Oral BID BM  . heparin  5,000 Units Subcutaneous Q8H  . multivitamin with minerals  1 tablet Oral Daily   .  sodium bicarbonate  infusion 1000 mL 150 mL/hr at 09/30/17 0230   acetaminophen **OR** acetaminophen  Exam: AA:M, thin, no distress No rash, cyanosis or gangrene Sclera anicteric, throat clear No jvd or bruits, flat neck veins Chest clear bilat RRR no MRG Abd soft ntnd no mass or ascites +bs, scaphoid GU normal male MS no joint effusions or deformity Ext no LE edema / no wounds or ulcers NF , Ox 3     Impression: 1. Acute kidney injury - suspect due to severe vol depletion from repetitive N/V.  Social situation may be poor, hx of homelessness.  Creat down 6 > 2.7 with hydration.  Has some echogenicity on [place] of both kidneys.  May have some degree of CKD.  Cont IVF"s, repeat labs in am.  2. Severe vol depletion - improving 3. Metabolic acidosis - resolved.  AG 10 today.  4. Hyperkalemia - resolved  5. Hematuria/ pyuria - will send urine culture      Plan - will follow.    Kelly Splinter MD [facility] pager [phone]   09/30/2017, 12:09 PM   Recent Labs  Lab 09/29/17 1355 09/29/17 1614 09/29/17 1703 09/30/17 0443  NA 135 133*  --  141  K 6.6* 6.4*  --  3.7  CL 92* 95*  --  102  CO2 18* 20*  --  29  GLUCOSE 193* 145*  --  103*  BUN 67* 67*  --  62*  CREATININE 6.38* 5.60* 5.42* 2.91*  CALCIUM 9.6 7.8*  --  7.4*   Recent Labs  Lab  09/29/17 1355 09/30/17 0443  AST 131* 239*  ALT 36 51  ALKPHOS 81 49  BILITOT 1.1 1.0  PROT 10.1* 6.2*  ALBUMIN 5.7* 3.7   Recent Labs  Lab 09/29/17 1355 09/29/17 1703 09/30/17 0443  WBC 14.5* 14.0* 11.0*  NEUTROABS 12.7*  --   --   HGB 20.4* 18.5* 14.1  HCT 55.2* 50.5 39.6  MCV 81.8 78.9 80.8  PLT 217 207 158   Iron/TIBC/Ferritin/ %Sat No results found for: IRON, TIBC, FERRITIN, IRONPCTSAT

## 2017-10-01 DIAGNOSIS — M6282 Rhabdomyolysis: Secondary | ICD-10-CM

## 2017-10-01 DIAGNOSIS — E875 Hyperkalemia: Secondary | ICD-10-CM

## 2017-10-01 DIAGNOSIS — F1210 Cannabis abuse, uncomplicated: Secondary | ICD-10-CM

## 2017-10-01 DIAGNOSIS — N179 Acute kidney failure, unspecified: Principal | ICD-10-CM

## 2017-10-01 DIAGNOSIS — R110 Nausea: Secondary | ICD-10-CM

## 2017-10-01 LAB — RENAL FUNCTION PANEL
ANION GAP: 7 (ref 5–15)
Albumin: 3.2 g/dL — ABNORMAL LOW (ref 3.5–5.0)
BUN: 31 mg/dL — ABNORMAL HIGH (ref 6–20)
CHLORIDE: 97 mmol/L — AB (ref 101–111)
CO2: 34 mmol/L — ABNORMAL HIGH (ref 22–32)
CREATININE: 1.22 mg/dL (ref 0.61–1.24)
Calcium: 7.6 mg/dL — ABNORMAL LOW (ref 8.9–10.3)
Glucose, Bld: 112 mg/dL — ABNORMAL HIGH (ref 65–99)
POTASSIUM: 3.1 mmol/L — AB (ref 3.5–5.1)
Phosphorus: 1.4 mg/dL — ABNORMAL LOW (ref 2.5–4.6)
Sodium: 138 mmol/L (ref 135–145)

## 2017-10-01 MED ORDER — ADULT MULTIVITAMIN W/MINERALS CH: 1.0000 | ORAL_TABLET | Freq: Every day | ORAL | 1 refills | Status: DC

## 2017-10-01 MED ORDER — POTASSIUM CHLORIDE CRYS ER 20 MEQ PO TBCR
40.0000 meq | EXTENDED_RELEASE_TABLET | Freq: Once | ORAL | Status: AC
  Administered 2017-10-01: 40 meq via ORAL
  Filled 2017-10-01: qty 2

## 2017-10-01 MED ORDER — ONDANSETRON 8 MG PO TBDP: 8.0000 mg | ORAL_TABLET | Freq: Three times a day (TID) | ORAL | 0 refills | Status: DC | PRN

## 2017-10-01 MED ORDER — ENSURE ENLIVE PO LIQD: 237.0000 mL | Freq: Two times a day (BID) | ORAL | 6 refills | Status: DC

## 2017-10-01 MED ORDER — PANTOPRAZOLE SODIUM 40 MG PO TBEC: 40.0000 mg | DELAYED_RELEASE_TABLET | Freq: Every day | ORAL | 1 refills | Status: DC

## 2017-10-01 NOTE — Discharge Summary (Signed)
Physician Discharge Summary  Henry Kramer:[id number] DOB: Mar 05, 1971 DOA: 09/29/2017  PCP: Marliss Coots, NP  Admit date: 09/29/2017 Discharge date: 10/01/2017  Time spent: 35 minutes  Recommendations for Outpatient Follow-up:  1. Repeat BMET to follow electrolytes and renal function  2. Repeat CK level   Discharge Diagnoses:  Principal Problem:   ARF (acute renal failure) (HCC) Active Problems:   Metabolic acidosis   Marijuana abuse   Rhabdomyolysis   Hyperkalemia   Discharge Condition: stable and improved. Discharge home with instructions to follow up with PCP in 10 days.  Diet recommendation: regular diet   Filed Weights   09/29/17 1244 09/29/17 1909  Weight: 72.6 kg (160 lb) 61.7 kg (136 lb 0.4 oz)    History of present illness:  46 y.o.malewith medical history significant ofpolysubstance abuse, reported chronic nausea and homelessness who presents to the ED with complaints of cramping. Patient reported having flare of his chronic nausea, with subsequent vomiting and poor oral intake for over 3-4 days prior to admission. Found very dehydrated with acute renal failure and metabolic acidosis.    Hospital Course:  1-ARF: appears to be pre-renal in nature from dehydration and poor oral intake. Also with elevated CK level there is suggestion of rhabdomyolysis playing a role in renal failure as well. -resolved with IVF's -at discharge Cr 1.3 -renal service recommended outpatient follow up of CK level -patient advise to continue/maintain proper hydration  -please repeat BMET at follow up visit to reassess renal function  -renal US w/o obstructive uropathy  2-metabolic acidosis -due to renal failure -resolved with IVF's and improvement in renal function  -Anion gap was 7 at discharge  3-rhabdomyolysis -continue adequate hydration  -repeat CK level at discharge   4-hyperkalemia -Due to renal failure -patient received bicarb, calcium gluconate and  kayexalate -K is 3.1 at discharge; in fact was repleted with 40 Meq prior to discharge -no further active GI loses at this moment  5-nausea/vomiting and anorexia -continue PRN antiemetics -no abnormalities seen on abd x-ray -advise to use PPI for GERD component and to avoid recreational drugs (marijuana)  6-severe protein calorie malnutrition  -Body mass index is 17 kg/m. -discharge on Ensure as recommended by nutritional service -patient also instructed to take a Multivitamin daily.  Procedures:  See below for x-ray reports   Consultations:  Nephrology   Discharge Exam: Vitals:   09/30/17 2146 10/01/17 0625  BP: 114/63 122/68  Pulse: 64 (!) 56  Resp: 18 18  Temp: 98.9 F (37.2 C) 98.1 F (36.7 C)  SpO2: 99% 97%     General:  Afebrile, no CP, no SOB. Patient reports feeling much better and having improvement in his body aches and appetite. No nausea, no vomiting.  Cardiovascular: no rubs, no gallops, no murmur, S1 and S2  Respiratory: good air movement, no wheezing, no crackles  Abdomen: soft, NT, ND, positive BS  Musculoskeletal: no edema, no cyanosis    Discharge Instructions   Discharge Instructions    Discharge instructions   Complete by:  As directed    Keep yourself well hydrated  Take medications as prescribed Avoid the use of NSAID's Arrange follow up with PCP in 10 days Avoid the use of recreational substances.     Current Discharge Medication List    START taking these medications   Details  feeding supplement, ENSURE ENLIVE, (ENSURE ENLIVE) LIQD Take 237 mLs 2 (two) times daily between meals by mouth. Qty: 8000 mL, Refills: 6    Multiple  Vitamin (MULTIVITAMIN WITH MINERALS) TABS tablet Take 1 tablet daily by mouth. Qty: 30 tablet, Refills: 1    ondansetron (ZOFRAN ODT) 8 MG disintegrating tablet Take 1 tablet (8 mg total) every 8 (eight) hours as needed by mouth for nausea or vomiting. Qty: 20 tablet, Refills: 0      CONTINUE these  medications which have CHANGED   Details  pantoprazole (PROTONIX) 40 MG tablet Take 1 tablet (40 mg total) daily by mouth. Qty: 30 tablet, Refills: 1   Associated Diagnoses: Chronic nausea      CONTINUE these medications which have NOT CHANGED   Details  tamsulosin (FLOMAX) 0.4 MG CAPS capsule Take 1 capsule (0.4 mg total) by mouth daily after breakfast. Qty: 30 capsule, Refills: 1   Associated Diagnoses: Benign prostatic hyperplasia with nocturia       No Known Allergies Follow-up Information    Placey, Audrea Muscat, NP. Schedule an appointment as soon as possible for a visit.   Why:  Audrea Muscat can help you with your medicaitons.  Please go to the [facility] for assistance. Contact information: [place] [address] [phone]           The results of significant diagnostics from this hospitalization (including imaging, microbiology, ancillary and laboratory) are listed below for reference.    Significant Diagnostic Studies: US Renal  Result Date: 09/29/2017 CLINICAL DATA:  Elevated BUN and creatinine. EXAM: RENAL / URINARY TRACT ULTRASOUND COMPLETE COMPARISON:  MRI from 08/11/2016 FINDINGS: Right Kidney: Length: 10.1 cm. Increased parenchymal echogenicity. No mass or hydronephrosis visualized. Left Kidney: Length: 10.9 cm. Increased parenchymal echogenicity. No mass or hydronephrosis visualized. Bladder: Appears normal for degree of bladder distention. Other:  Prostate measures 4.7 x 3.7 x 4.5 cm (volume = 41 cm^3) IMPRESSION: 1. No hydronephrosis. 2. Bilateral echogenic kidneys compatible with chronic medical renal disease. Electronically Signed   By: Kerby Moors M.D.   On: 09/29/2017 18:59   Dg Abd Portable 1v  Result Date: 09/29/2017 CLINICAL DATA:  Abdominal pain and vomiting. EXAM: PORTABLE ABDOMEN - 1 VIEW COMPARISON:  Abdominal x-ray dated February 28, 2013. FINDINGS: The bowel gas pattern is normal. No radio-opaque calculi or other significant radiographic  abnormality are seen. IMPRESSION: Negative. Electronically Signed   By: Titus Dubin M.D.   On: 09/29/2017 17:18   Labs: Basic Metabolic Panel: Recent Labs  Lab 09/29/17 1355 09/29/17 1614 09/29/17 1703 09/30/17 0443 10/01/17 0423  NA 135 133*  --  141 138  K 6.6* 6.4*  --  3.7 3.1*  CL 92* 95*  --  102 97*  CO2 18* 20*  --  29 34*  GLUCOSE 193* 145*  --  103* 112*  BUN 67* 67*  --  62* 31*  CREATININE 6.38* 5.60* 5.42* 2.91* 1.22  CALCIUM 9.6 7.8*  --  7.4* 7.6*  MG  --   --  2.5*  --   --   PHOS  --   --   --   --  1.4*   Liver Function Tests: Recent Labs  Lab 09/29/17 1355 09/30/17 0443 10/01/17 0423  AST 131* 239*  --   ALT 36 51  --   ALKPHOS 81 49  --   BILITOT 1.1 1.0  --   PROT 10.1* 6.2*  --   ALBUMIN 5.7* 3.7 3.2*   Recent Labs  Lab 09/29/17 1355  LIPASE 31   No results for input(s): AMMONIA in the last 168 hours. CBC: Recent Labs  Lab 09/29/17 1355 09/29/17 1703 09/30/17 0443  WBC 14.5* 14.0* 11.0*  NEUTROABS 12.7*  --   --   HGB 20.4* 18.5* 14.1  HCT 55.2* 50.5 39.6  MCV 81.8 78.9 80.8  PLT 217 207 158   Cardiac Enzymes: Recent Labs  Lab 09/29/17 1355 09/30/17 0443  CKTOTAL 3,673* 14,397*    Signed:  Barton Dubois MD.  [facility] 10/01/2017, 2:42 PM

## 2017-10-01 NOTE — Care Management Note (Signed)
Case Management Note  Patient Details  Name: Henry Kramer MRN: [id number] Date of Birth: Jun 12, 1971  Subjective/Objective: 46 y/o m admitted w/ARF. CM referral-see prior CM note. Spoke to patient about d/c plan-he states he has a home to go to, he just needs a bus pass. Reinforced to go to [facility] for meds, & medical assistance. Patient voiced understanding. No further CM needs.                   Action/Plan:d/c home.   Expected Discharge Date:  10/01/17               Expected Discharge Plan:  Home/Self Care(Homeless)  In-House Referral:     Discharge planning Services  CM Consult, Medication Assistance  Post Acute Care Choice:    Choice offered to:  Patient  DME Arranged:    DME Agency:     HH Arranged:    [place] Agency:     Status of Service:  Completed, signed off  If discussed at [employer] of Stay Meetings, dates discussed:    Additional Comments:  Dessa Phi, RN 10/01/2017, 3:05 PM

## 2017-10-01 NOTE — Progress Notes (Signed)
[facility] Progress Note  Subjective: creat down to 1.3.  Pt feeling much better.   Vitals:   09/30/17 0504 09/30/17 1345 09/30/17 2146 10/01/17 0625  BP: 104/69 128/79 114/63 122/68  Pulse: 71 80 64 (!) 56  Resp: 16  18 18   Temp: 98.1 F (36.7 C) 98.5 F (36.9 C) 98.9 F (37.2 C) 98.1 F (36.7 C)  TempSrc: Oral Oral Oral Oral  SpO2: 98% 100% 99% 97%  Weight:      Height:        Inpatient medications: . feeding supplement (ENSURE ENLIVE)  237 mL Oral BID BM  . heparin  5,000 Units Subcutaneous Q8H  . multivitamin with minerals  1 tablet Oral Daily    acetaminophen **OR** acetaminophen  Exam: AA:M, thin, no distress No rash, cyanosis or gangrene Sclera anicteric, throat clear No jvd or bruits, flat neck veins Chest clear bilat RRR no MRG Abd soft ntnd no mass or ascites +bs, scaphoid GU normal male MS no joint effusions or deformity Ext no LE edema / no wounds or ulcers NF , Ox 3     Impression: 1. Acute kidney injury - due to severe vol depletion from repetitive N/V. Creat down to 1.2.  No further suggestions, will sign off.  2. Severe vol depletion - resolved 3. Hematuria/ pyuria - please f/u on urine culture      Plan - as above   Kelly Splinter MD [facility] pager [phone]   10/01/2017, 10:32 AM   Recent Labs  Lab 09/29/17 1614 09/29/17 1703 09/30/17 0443 10/01/17 0423  NA 133*  --  141 138  K 6.4*  --  3.7 3.1*  CL 95*  --  102 97*  CO2 20*  --  29 34*  GLUCOSE 145*  --  103* 112*  BUN 67*  --  62* 31*  CREATININE 5.60* 5.42* 2.91* 1.22  CALCIUM 7.8*  --  7.4* 7.6*  PHOS  --   --   --  1.4*   Recent Labs  Lab 09/29/17 1355 09/30/17 0443 10/01/17 0423  AST 131* 239*  --   ALT 36 51  --   ALKPHOS 81 49  --   BILITOT 1.1 1.0  --   PROT 10.1* 6.2*  --   ALBUMIN 5.7* 3.7 3.2*   Recent Labs  Lab 09/29/17 1355 09/29/17 1703 09/30/17 0443  WBC 14.5* 14.0* 11.0*  NEUTROABS 12.7*  --   --    HGB 20.4* 18.5* 14.1  HCT 55.2* 50.5 39.6  MCV 81.8 78.9 80.8  PLT 217 207 158   Iron/TIBC/Ferritin/ %Sat No results found for: IRON, TIBC, FERRITIN, IRONPCTSAT

## 2017-10-03 LAB — URINE CULTURE

## 2018-03-08 ENCOUNTER — Encounter (HOSPITAL_COMMUNITY): Payer: Self-pay | Admitting: *Deleted

## 2018-03-08 ENCOUNTER — Emergency Department (HOSPITAL_COMMUNITY): Payer: Self-pay

## 2018-03-08 ENCOUNTER — Inpatient Hospital Stay (HOSPITAL_COMMUNITY)
Admission: EM | Admit: 2018-03-08 | Discharge: 2018-03-10 | DRG: 683 | Disposition: A | Discharge status: Home / Self Care | Payer: Self-pay | Attending: Internal Medicine | Admitting: Internal Medicine

## 2018-03-08 DIAGNOSIS — G43A0 Cyclical vomiting, not intractable: Secondary | ICD-10-CM | POA: Diagnosis present

## 2018-03-08 DIAGNOSIS — D573 Sickle-cell trait: Secondary | ICD-10-CM | POA: Diagnosis present

## 2018-03-08 DIAGNOSIS — T407X5A Adverse effect of cannabis (derivatives), initial encounter: Secondary | ICD-10-CM | POA: Diagnosis present

## 2018-03-08 DIAGNOSIS — F1210 Cannabis abuse, uncomplicated: Secondary | ICD-10-CM | POA: Diagnosis present

## 2018-03-08 DIAGNOSIS — R7989 Other specified abnormal findings of blood chemistry: Secondary | ICD-10-CM | POA: Diagnosis present

## 2018-03-08 DIAGNOSIS — F419 Anxiety disorder, unspecified: Secondary | ICD-10-CM | POA: Diagnosis present

## 2018-03-08 DIAGNOSIS — K219 Gastro-esophageal reflux disease without esophagitis: Secondary | ICD-10-CM | POA: Diagnosis present

## 2018-03-08 DIAGNOSIS — Z79899 Other long term (current) drug therapy: Secondary | ICD-10-CM

## 2018-03-08 DIAGNOSIS — F32A Depression, unspecified: Secondary | ICD-10-CM | POA: Diagnosis present

## 2018-03-08 DIAGNOSIS — Z87898 Personal history of other specified conditions: Secondary | ICD-10-CM

## 2018-03-08 DIAGNOSIS — N179 Acute kidney failure, unspecified: Principal | ICD-10-CM | POA: Diagnosis present

## 2018-03-08 DIAGNOSIS — D72829 Elevated white blood cell count, unspecified: Secondary | ICD-10-CM | POA: Diagnosis present

## 2018-03-08 DIAGNOSIS — E860 Dehydration: Secondary | ICD-10-CM | POA: Diagnosis present

## 2018-03-08 DIAGNOSIS — I248 Other forms of acute ischemic heart disease: Secondary | ICD-10-CM | POA: Diagnosis present

## 2018-03-08 DIAGNOSIS — R110 Nausea: Secondary | ICD-10-CM | POA: Diagnosis present

## 2018-03-08 DIAGNOSIS — F329 Major depressive disorder, single episode, unspecified: Secondary | ICD-10-CM | POA: Diagnosis present

## 2018-03-08 DIAGNOSIS — R778 Other specified abnormalities of plasma proteins: Secondary | ICD-10-CM | POA: Diagnosis present

## 2018-03-08 DIAGNOSIS — M6282 Rhabdomyolysis: Secondary | ICD-10-CM | POA: Diagnosis present

## 2018-03-08 DIAGNOSIS — F1911 Other psychoactive substance abuse, in remission: Secondary | ICD-10-CM

## 2018-03-08 DIAGNOSIS — Z590 Homelessness: Secondary | ICD-10-CM

## 2018-03-08 DIAGNOSIS — F17210 Nicotine dependence, cigarettes, uncomplicated: Secondary | ICD-10-CM | POA: Diagnosis present

## 2018-03-08 HISTORY — DX: Cannabis use, unspecified, uncomplicated: F12.90

## 2018-03-08 LAB — RAPID URINE DRUG SCREEN, HOSP PERFORMED
Amphetamines: NOT DETECTED
BARBITURATES: NOT DETECTED
BENZODIAZEPINES: NOT DETECTED
COCAINE: NOT DETECTED
Opiates: NOT DETECTED
TETRAHYDROCANNABINOL: POSITIVE — AB

## 2018-03-08 LAB — CBC WITH DIFFERENTIAL/PLATELET
BASOS PCT: 0 %
Basophils Absolute: 0 10*3/uL (ref 0.0–0.1)
EOS ABS: 0 10*3/uL (ref 0.0–0.7)
Eosinophils Relative: 0 %
HCT: 49 % (ref 39.0–52.0)
HEMOGLOBIN: 17.9 g/dL — AB (ref 13.0–17.0)
Lymphocytes Relative: 4 %
Lymphs Abs: 0.7 10*3/uL (ref 0.7–4.0)
MCH: 30.4 pg (ref 26.0–34.0)
MCHC: 36.5 g/dL — AB (ref 30.0–36.0)
MCV: 83.2 fL (ref 78.0–100.0)
MONOS PCT: 5 %
Monocytes Absolute: 1 10*3/uL (ref 0.1–1.0)
NEUTROS PCT: 91 %
Neutro Abs: 16.1 10*3/uL — ABNORMAL HIGH (ref 1.7–7.7)
Platelets: 248 10*3/uL (ref 150–400)
RBC: 5.89 MIL/uL — ABNORMAL HIGH (ref 4.22–5.81)
RDW: 13.5 % (ref 11.5–15.5)
WBC: 17.7 10*3/uL — AB (ref 4.0–10.5)

## 2018-03-08 LAB — COMPREHENSIVE METABOLIC PANEL
ALK PHOS: 62 U/L (ref 38–126)
ALT: 18 U/L (ref 17–63)
ANION GAP: 20 — AB (ref 5–15)
AST: 45 U/L — ABNORMAL HIGH (ref 15–41)
Albumin: 4.8 g/dL (ref 3.5–5.0)
BUN: 52 mg/dL — ABNORMAL HIGH (ref 6–20)
CALCIUM: 8.6 mg/dL — AB (ref 8.9–10.3)
CO2: 16 mmol/L — ABNORMAL LOW (ref 22–32)
Chloride: 103 mmol/L (ref 101–111)
Creatinine, Ser: 5.73 mg/dL — ABNORMAL HIGH (ref 0.61–1.24)
GFR, EST AFRICAN AMERICAN: 12 mL/min — AB (ref >60)
GFR, EST NON AFRICAN AMERICAN: 11 mL/min — AB (ref >60)
GLUCOSE: 124 mg/dL — AB (ref 65–99)
Potassium: 4.6 mmol/L (ref 3.5–5.1)
Sodium: 139 mmol/L (ref 135–145)
TOTAL PROTEIN: 8.3 g/dL — AB (ref 6.5–8.1)
Total Bilirubin: 0.9 mg/dL (ref 0.3–1.2)

## 2018-03-08 LAB — URINALYSIS, ROUTINE W REFLEX MICROSCOPIC
Bacteria, UA: NONE SEEN
Bilirubin Urine: NEGATIVE
Glucose, UA: NEGATIVE mg/dL
Ketones, ur: NEGATIVE mg/dL
Nitrite: NEGATIVE
PH: 5 (ref 5.0–8.0)
Protein, ur: 100 mg/dL — AB
SPECIFIC GRAVITY, URINE: 1.018 (ref 1.005–1.030)

## 2018-03-08 LAB — SODIUM, URINE, RANDOM: Sodium, Ur: 24 mmol/L

## 2018-03-08 LAB — CK: CK TOTAL: 1732 U/L — AB (ref 49–397)

## 2018-03-08 LAB — TROPONIN I
Troponin I: 0.03 ng/mL (ref <0.03)
Troponin I: 0.08 ng/mL (ref <0.03)

## 2018-03-08 LAB — CREATININE, URINE, RANDOM: Creatinine, Urine: 279.38 mg/dL

## 2018-03-08 LAB — LIPASE, BLOOD: Lipase: 37 U/L (ref 11–51)

## 2018-03-08 MED ORDER — ONDANSETRON HCL 4 MG PO TABS: 4.0000 mg | ORAL_TABLET | Freq: Four times a day (QID) | ORAL | Status: DC | PRN

## 2018-03-08 MED ORDER — HYDROCODONE-ACETAMINOPHEN 5-325 MG PO TABS: 1.0000 | ORAL_TABLET | ORAL | Status: DC | PRN

## 2018-03-08 MED ORDER — ONDANSETRON HCL 4 MG/2ML IJ SOLN
4.0000 mg | Freq: Four times a day (QID) | INTRAMUSCULAR | Status: DC | PRN
  Administered 2018-03-08 – 2018-03-10 (×2): 4 mg via INTRAVENOUS
  Filled 2018-03-08 (×2): qty 2

## 2018-03-08 MED ORDER — ENOXAPARIN SODIUM 30 MG/0.3ML ~~LOC~~ SOLN
30.0000 mg | SUBCUTANEOUS | Status: DC
  Administered 2018-03-08 – 2018-03-09 (×2): 30 mg via SUBCUTANEOUS
  Filled 2018-03-08 (×2): qty 0.3

## 2018-03-08 MED ORDER — PANTOPRAZOLE SODIUM 40 MG PO TBEC
40.0000 mg | DELAYED_RELEASE_TABLET | Freq: Every day | ORAL | Status: DC
  Administered 2018-03-08 – 2018-03-10 (×3): 40 mg via ORAL
  Filled 2018-03-08 (×3): qty 1

## 2018-03-08 MED ORDER — SODIUM CHLORIDE 0.9 % IV SOLN
INTRAVENOUS | Status: AC
  Administered 2018-03-08 – 2018-03-09 (×2): via INTRAVENOUS

## 2018-03-08 MED ORDER — VITAMIN B-1 100 MG PO TABS
100.0000 mg | ORAL_TABLET | Freq: Every day | ORAL | Status: DC
  Administered 2018-03-08 – 2018-03-10 (×3): 100 mg via ORAL
  Filled 2018-03-08 (×3): qty 1

## 2018-03-08 MED ORDER — SODIUM CHLORIDE 0.9 % IV BOLUS
1000.0000 mL | Freq: Once | INTRAVENOUS | Status: AC
  Administered 2018-03-08: 1000 mL via INTRAVENOUS

## 2018-03-08 MED ORDER — ACETAMINOPHEN 650 MG RE SUPP: 650.0000 mg | Freq: Four times a day (QID) | RECTAL | Status: DC | PRN

## 2018-03-08 MED ORDER — ACETAMINOPHEN 325 MG PO TABS
650.0000 mg | ORAL_TABLET | Freq: Four times a day (QID) | ORAL | Status: DC | PRN
  Administered 2018-03-10: 650 mg via ORAL
  Filled 2018-03-08: qty 2

## 2018-03-08 MED ORDER — SODIUM CHLORIDE 0.9 % IV SOLN
INTRAVENOUS | Status: DC
  Administered 2018-03-08 – 2018-03-10 (×4): via INTRAVENOUS

## 2018-03-08 NOTE — ED Provider Notes (Signed)
[facility] DEPT Provider Note   CSN: [id number] Arrival date & time: 03/08/18  1457     History   Chief Complaint Chief Complaint  Patient presents with  . Dehydration    HPI Henry Kramer is a 47 y.o. male.     Pt was seen at 1535. Per EMS and pt report, c/o gradual onset and worsening of persistent generalized "muscles cramping" for the past 2 days. Has been associated with acute flair of his chronic nausea, "a few episodes" of vomiting, as well as "dark urine." Pt endorses hx of similar symptoms previously, dx ARF. EMS gave IVF NS 1.5L en route. Denies diarrhea, no CP/SOB, no abd pain, no back pain, no fevers, no rash, no focal motor weakness.   Past Medical History:  Diagnosis Date  . AKI (acute kidney injury) ([place]) 05/22/2014  . Chronic nausea   . GERD (gastroesophageal reflux disease)   . Homelessness   . Marijuana use   . Substance abuse ([place])    tobacco, marijuana, EtOH    Patient Active Problem List   Diagnosis Date Noted  . ARF (acute renal failure) ([place]) 09/29/2017  . Metabolic acidosis 27/04/2375  . Marijuana abuse 09/29/2017  . Rhabdomyolysis 09/29/2017  . Hyperkalemia 09/29/2017  . Benign prostatic hyperplasia with nocturia 09/06/2016  . Healthcare maintenance 09/06/2016  . Hematuria 07/30/2016  . Abnormal CT of liver 07/30/2016  . Protein-calorie malnutrition, severe 10/29/2015  . AKI (acute kidney injury) ([place]) 05/22/2014  . Chronic nausea 04/21/2014  . Smoking 04/21/2014  . History of substance abuse 04/21/2014    Past Surgical History:  Procedure Laterality Date  . NO PAST SURGERIES          Home Medications    Prior to Admission medications   Medication Sig Start Date End Date Taking? Authorizing Provider  feeding supplement, ENSURE ENLIVE, (ENSURE ENLIVE) LIQD Take 237 mLs 2 (two) times daily between meals by mouth. 10/01/17   Barton Dubois, MD  Multiple Vitamin (MULTIVITAMIN WITH MINERALS) TABS  tablet Take 1 tablet daily by mouth. 10/02/17   Barton Dubois, MD  ondansetron (ZOFRAN ODT) 8 MG disintegrating tablet Take 1 tablet (8 mg total) every 8 (eight) hours as needed by mouth for nausea or vomiting. 10/01/17   Barton Dubois, MD  pantoprazole (PROTONIX) 40 MG tablet Take 1 tablet (40 mg total) daily by mouth. 10/01/17 10/01/18  Barton Dubois, MD  tamsulosin (FLOMAX) 0.4 MG CAPS capsule Take 1 capsule (0.4 mg total) by mouth daily after breakfast. Patient not taking: Reported on 09/29/2017 09/04/16   Jule Ser, DO    Family History Family History  Problem Relation Age of Onset  . Cancer Mother   . Alcohol abuse Father   . Cirrhosis Father   . Cancer Sister   . Diabetes Brother   . Obesity Brother     Social History Social History   Tobacco Use  . Smoking status: Current Every Day Smoker    Packs/day: 0.50    Years: 22.00    Pack years: 11.00    Types: Cigarettes  . Smokeless tobacco: Never Used  Substance Use Topics  . Alcohol use: Yes    Comment: "occasional".    . Drug use: Yes    Types: Marijuana    Comment: smokes THC 2-3 times per week.     Allergies   Patient has no known allergies.   Review of Systems Review of Systems ROS: Statement: All systems negative except as marked or noted  in the HPI; Constitutional: Negative for fever and chills. ; ; Eyes: Negative for eye pain, redness and discharge. ; ; ENMT: Negative for ear pain, hoarseness, nasal congestion, sinus pressure and sore throat. ; ; Cardiovascular: Negative for chest pain, palpitations, diaphoresis, dyspnea and peripheral edema. ; ; Respiratory: Negative for cough, wheezing and stridor. ; ; Gastrointestinal: +N/V. Negative for diarrhea, abdominal pain, blood in stool, hematemesis, jaundice and rectal bleeding. . ; ; Genitourinary: Negative for dysuria, flank pain and hematuria. ; ; Musculoskeletal: +muscles cramping. Negative for back pain and neck pain. Negative for swelling and trauma.; ;  Skin: Negative for pruritus, rash, abrasions, blisters, bruising and skin lesion.; ; Neuro: Negative for headache, lightheadedness and neck stiffness. Negative for weakness, altered level of consciousness, altered mental status, extremity weakness, paresthesias, involuntary movement, seizure and syncope.       Physical Exam Updated Vital Signs BP (!) 135/92 (BP Location: Right Arm)   Pulse 93   Temp 98.4 F (36.9 C) (Oral)   Resp 16   SpO2 100%    Patient Vitals for the past 24 hrs:  BP Temp Temp src Pulse Resp SpO2  03/08/18 1641 (!) 135/92 98.4 F (36.9 C) Oral 93 16 100 %  03/08/18 1513 (!) 136/93 - - 90 - 96 %     Physical Exam 1540: Physical examination:  Nursing notes reviewed; Vital signs and O2 SAT reviewed;  Constitutional: Well developed, Well nourished, In no acute distress; Head:  Normocephalic, atraumatic; Eyes: EOMI, PERRL, No scleral icterus; ENMT: Mouth and pharynx normal, Mucous membranes dry; Neck: Supple, Full range of motion, No lymphadenopathy; Cardiovascular: Regular rate and rhythm, No gallop; Respiratory: Breath sounds clear & equal bilaterally, No wheezes.  Speaking full sentences with ease, Normal respiratory effort/excursion; Chest: Nontender, Movement normal; Abdomen: Soft, Nontender, Nondistended, Normal bowel sounds; Genitourinary: No CVA tenderness; Extremities: Peripheral pulses normal, No tenderness, No edema, No calf edema or asymmetry.; Neuro: AA&Ox3, Major CN grossly intact.  Speech clear. No gross focal motor or sensory deficits in extremities.; Skin: Color normal, Warm, Dry.   ED Treatments / Results  Labs (all labs ordered are listed, but only abnormal results are displayed)   EKG EKG Interpretation  Date/Time:  Monday March 08 2018 16:40:35 EDT Ventricular Rate:  87 PR Interval:    QRS Duration: 111 QT Interval:  367 QTC Calculation: 442 R Axis:   -60 Text Interpretation:  Sinus rhythm Atrial premature complex Borderline short PR  interval Right atrial enlargement Incomplete RBBB and LAFB Consider right ventricular hypertrophy Nonspecific T abnormalities, lateral leads When compared with ECG of 09/29/2017 No significant change was found Confirmed by Francine Graven [phone]) on 03/08/2018 4:50:58 PM   Radiology   Procedures Procedures (including critical care time)  Medications Ordered in ED Medications  0.9 %  sodium chloride infusion (has no administration in time range)     Initial Impression / Assessment and Plan / ED Course  I have reviewed the triage vital signs and the nursing notes.  Pertinent labs & imaging results that were available during my care of the patient were reviewed by me and considered in my medical decision making (see chart for details).  MDM Reviewed: previous chart, nursing note and vitals Reviewed previous: labs and ECG Interpretation: ECG, labs and x-ray Total time providing critical care: 30-74 minutes. This excludes time spent performing separately reportable procedures and services. Consults: admitting MD   CRITICAL CARE Performed by: Alfonzo Feller Total critical care time: 35 minutes Critical care time  was exclusive of separately billable procedures and treating other patients. Critical care was necessary to treat or prevent imminent or life-threatening deterioration. Critical care was time spent personally by me on the following activities: development of treatment plan with patient and/or surrogate as well as nursing, discussions with consultants, evaluation of patient's response to treatment, examination of patient, obtaining history from patient or surrogate, ordering and performing treatments and interventions, ordering and review of laboratory studies, ordering and review of radiographic studies, pulse oximetry and re-evaluation of patient's condition.   Results for orders placed or performed during the hospital encounter of 03/08/18  Comprehensive metabolic panel    Result Value Ref Range   Sodium 139 135 - 145 mmol/L   Potassium 4.6 3.5 - 5.1 mmol/L   Chloride 103 101 - 111 mmol/L   CO2 16 (L) 22 - 32 mmol/L   Glucose, Bld 124 (H) 65 - 99 mg/dL   BUN 52 (H) 6 - 20 mg/dL   Creatinine, Ser 5.73 (H) 0.61 - 1.24 mg/dL   Calcium 8.6 (L) 8.9 - 10.3 mg/dL   Total Protein 8.3 (H) 6.5 - 8.1 g/dL   Albumin 4.8 3.5 - 5.0 g/dL   AST 45 (H) 15 - 41 U/L   ALT 18 17 - 63 U/L   Alkaline Phosphatase 62 38 - 126 U/L   Total Bilirubin 0.9 0.3 - 1.2 mg/dL   GFR calc non Af Amer 11 (L) >60 mL/min   GFR calc Af Amer 12 (L) >60 mL/min   Anion gap 20 (H) 5 - 15  Lipase, blood  Result Value Ref Range   Lipase 37 11 - 51 U/L  CBC with Differential  Result Value Ref Range   WBC 17.7 (H) 4.0 - 10.5 K/uL   RBC 5.89 (H) 4.22 - 5.81 MIL/uL   Hemoglobin 17.9 (H) 13.0 - 17.0 g/dL   HCT 49.0 39.0 - 52.0 %   MCV 83.2 78.0 - 100.0 fL   MCH 30.4 26.0 - 34.0 pg   MCHC 36.5 (H) 30.0 - 36.0 g/dL   RDW 13.5 11.5 - 15.5 %   Platelets 248 150 - 400 K/uL   Neutrophils Relative % 91 %   Neutro Abs 16.1 (H) 1.7 - 7.7 K/uL   Lymphocytes Relative 4 %   Lymphs Abs 0.7 0.7 - 4.0 K/uL   Monocytes Relative 5 %   Monocytes Absolute 1.0 0.1 - 1.0 K/uL   Eosinophils Relative 0 %   Eosinophils Absolute 0.0 0.0 - 0.7 K/uL   Basophils Relative 0 %   Basophils Absolute 0.0 0.0 - 0.1 K/uL  CK  Result Value Ref Range   Total CK 1,732 (H) 49 - 397 U/L  Troponin I  Result Value Ref Range   Troponin I 0.03 (HH) <0.03 ng/mL   Dg Abd Acute W/chest Result Date: 03/08/2018 CLINICAL DATA:  Initial evaluation for acute abdominal cramping, nausea, vomiting. EXAM: DG ABDOMEN ACUTE W/ 1V CHEST COMPARISON:  Prior ultrasound from 09/29/2017. FINDINGS: Cardiac and mediastinal silhouettes are within normal limits. Lungs are hyperinflated with attenuation of the pulmonary markings, suggesting emphysema. No focal infiltrates. No pulmonary edema or pleural effusion. No pneumothorax. Bowel gas pattern  within normal limits without obstruction or ileus. No abnormal bowel wall thickening. No free air. No soft tissue mass. Punctate 2 mm calcific density overlies the right renal shadow, which may reflect a tiny renal calculus. No acute osseus abnormality. IMPRESSION: 1. Nonobstructive bowel gas pattern. 2. 2 mm calcific density overlying the  right renal shadow, suspicious for right renal nephrolithiasis. 3. No active cardiopulmonary disease. 4. Emphysema. Electronically Signed   By: Jeannine Boga M.D.   On: 03/08/2018 16:29    1840:  New AKI on labs with elevated CK; IVF boluses given.  Potassium normal. No N/V or stooling while in the ED. T/C returned from [facility]. Roel Cluck, case discussed, including:  HPI, pertinent PM/SHx, VS/PE, dx testing, ED course and treatment:  Agreeable to admit.          Final Clinical Impressions(s) / ED Diagnoses   Final diagnoses:  None    ED Discharge Orders    None       Francine Graven, DO 03/10/18 1826

## 2018-03-08 NOTE — ED Notes (Signed)
Date and time results received: 03/08/18 at 17:54  Test: Troponin  Critical Value: 0.03   Name of Provider Notified: Dr. Towanda Malkin at 17:54. Notified Ubaldo Glassing, Therapist, sports (primary nurse).   Orders Received? Or Actions Taken?: Will continue to monitor the patient and await for any new orders.

## 2018-03-08 NOTE — ED Notes (Signed)
ED TO INPATIENT HANDOFF REPORT  Name/Age/Gender Henry Kramer 47 y.o. male  Code Status Code Status History    Date Active Date Inactive Code Status Order ID Comments User Context   09/29/2017 1645 10/01/2017 1957 Full Code [id number]  Donne Hazel, MD ED   10/28/2015 1620 10/30/2015 1748 Full Code [id number]  Verlee Monte, MD Inpatient   05/22/2014 1454 05/23/2014 1744 Full Code [id number]  Blain Pais, MD ED      Home/SNF/Other Home  Chief Complaint dehydration  Level of Care/Admitting Diagnosis ED Disposition    ED Disposition Condition [facility] Area: [facility] [[id number]]  Level of Care: Telemetry [5]  Admit to tele based on following criteria: Monitor QTC interval  Diagnosis: Acute renal failure (ARF) [facility]) [[id number]]  Admitting Physician: Toy Baker [[id number]]  Attending Physician: Toy Baker [[id number]]  Estimated length of stay: 3 - 4 days  Certification:: I certify this patient will need inpatient services for at least 2 midnights  PT Class (Do Not Modify): Inpatient [101]  PT Acc Code (Do Not Modify): Private [1]       Medical History Past Medical History:  Diagnosis Date  . AKI (acute kidney injury) ([place]) 05/22/2014  . Chronic nausea   . GERD (gastroesophageal reflux disease)   . Homelessness   . Marijuana use   . Substance abuse ([place])    tobacco, marijuana, EtOH    Allergies No Known Allergies  IV Location/Drains/Wounds Patient Lines/Drains/Airways Status   Active Line/Drains/Airways    None          Labs/Imaging Results for orders placed or performed during the hospital encounter of 03/08/18 (from the past 48 hour(s))  Comprehensive metabolic panel     Status: Abnormal   Collection Time: 03/08/18  5:04 PM  Result Value Ref Range   Sodium 139 135 - 145 mmol/L   Potassium 4.6 3.5 - 5.1 mmol/L   Chloride 103 101 - 111 mmol/L   CO2 16 (L) 22 - 32 mmol/L   Glucose, Bld 124 (H) 65 - 99  mg/dL   BUN 52 (H) 6 - 20 mg/dL   Creatinine, Ser 5.73 (H) 0.61 - 1.24 mg/dL   Calcium 8.6 (L) 8.9 - 10.3 mg/dL   Total Protein 8.3 (H) 6.5 - 8.1 g/dL   Albumin 4.8 3.5 - 5.0 g/dL   AST 45 (H) 15 - 41 U/L   ALT 18 17 - 63 U/L   Alkaline Phosphatase 62 38 - 126 U/L   Total Bilirubin 0.9 0.3 - 1.2 mg/dL   GFR calc non Af Amer 11 (L) >60 mL/min   GFR calc Af Amer 12 (L) >60 mL/min    Comment: (NOTE) The eGFR has been calculated using the CKD EPI equation. This calculation has not been validated in all clinical situations. eGFR's persistently <60 mL/min signify possible Chronic Kidney Disease.    Anion gap 20 (H) 5 - 15    Comment: Performed at [facility], [place] [address], [place], [place] [address]  Lipase, blood     Status: None   Collection Time: 03/08/18  5:04 PM  Result Value Ref Range   Lipase 37 11 - 51 U/L    Comment: Performed at [facility], [place] [address] [place], [place] [address]  CBC with Differential     Status: Abnormal   Collection Time: 03/08/18  5:04 PM  Result Value Ref Range   WBC 17.7 (H) 4.0 -  10.5 K/uL   RBC 5.89 (H) 4.22 - 5.81 MIL/uL   Hemoglobin 17.9 (H) 13.0 - 17.0 g/dL   HCT 49.0 39.0 - 52.0 %   MCV 83.2 78.0 - 100.0 fL   MCH 30.4 26.0 - 34.0 pg   MCHC 36.5 (H) 30.0 - 36.0 g/dL   RDW 13.5 11.5 - 15.5 %   Platelets 248 150 - 400 K/uL   Neutrophils Relative % 91 %   Neutro Abs 16.1 (H) 1.7 - 7.7 K/uL   Lymphocytes Relative 4 %   Lymphs Abs 0.7 0.7 - 4.0 K/uL   Monocytes Relative 5 %   Monocytes Absolute 1.0 0.1 - 1.0 K/uL   Eosinophils Relative 0 %   Eosinophils Absolute 0.0 0.0 - 0.7 K/uL   Basophils Relative 0 %   Basophils Absolute 0.0 0.0 - 0.1 K/uL    Comment: Performed at [facility], [place] [address] [place], [place] [address]  CK     Status: Abnormal   Collection Time: 03/08/18  5:04 PM  Result Value Ref Range   Total CK 1,732 (H) 49 - 397 U/L    Comment: Performed at [facility], [place] [address], [place], [place] [address]  Troponin I     Status: Abnormal   Collection Time: 03/08/18  5:04 PM  Result Value Ref Range   Troponin I 0.03 (HH) <0.03 ng/mL    Comment: CRITICAL RESULT CALLED TO, READ BACK BY AND VERIFIED WITH: [facility] AT 6861 ON 03/08/18 BY N.THOMPSON Performed at [facility], [place] [address] [place], [place] [address]   Urinalysis, Routine w reflex microscopic     Status: Abnormal   Collection Time: 03/08/18  6:52 PM  Result Value Ref Range   Color, Urine AMBER (A) YELLOW    Comment: BIOCHEMICALS MAY BE AFFECTED BY COLOR   APPearance CLOUDY (A) CLEAR   Specific Gravity, Urine 1.018 1.005 - 1.030   pH 5.0 5.0 - 8.0   Glucose, UA NEGATIVE NEGATIVE mg/dL   Hgb urine dipstick LARGE (A) NEGATIVE   Bilirubin Urine NEGATIVE NEGATIVE   Ketones, ur NEGATIVE NEGATIVE mg/dL   Protein, ur 100 (A) NEGATIVE mg/dL   Nitrite NEGATIVE NEGATIVE   Leukocytes, UA TRACE (A) NEGATIVE   RBC / HPF 6-30 0 - 5 RBC/hpf   WBC, UA 6-30 0 - 5 WBC/hpf   Bacteria, UA NONE SEEN NONE SEEN   Squamous Epithelial / LPF 0-5 (A) NONE SEEN   Mucus PRESENT    Hyaline Casts, UA PRESENT     Comment: Performed at [facility], [place] [address], [place], [place] [address]  Urine rapid drug screen (hosp performed)     Status: Abnormal   Collection Time: 03/08/18  6:52 PM  Result Value Ref Range   Opiates NONE DETECTED NONE DETECTED   Cocaine NONE DETECTED NONE DETECTED   Benzodiazepines NONE DETECTED NONE DETECTED   Amphetamines NONE DETECTED NONE DETECTED   Tetrahydrocannabinol POSITIVE (A) NONE DETECTED   Barbiturates NONE DETECTED NONE DETECTED    Comment: (NOTE) DRUG SCREEN FOR MEDICAL PURPOSES ONLY.  IF CONFIRMATION IS NEEDED FOR ANY PURPOSE, NOTIFY LAB WITHIN 5 DAYS. LOWEST DETECTABLE LIMITS FOR URINE DRUG SCREEN Drug Class                     Cutoff (ng/mL) Amphetamine and metabolites    1000 Barbiturate and  metabolites    200 Benzodiazepine  706 Tricyclics and metabolites     300 Opiates and metabolites        300 Cocaine and metabolites        300 THC                            50 Performed at [facility], [place] [address], [place], [place] [address]   Sodium, urine, random     Status: None   Collection Time: 03/08/18  6:57 PM  Result Value Ref Range   Sodium, Ur 24 mmol/L    Comment: Performed at [facility], [place] [address] [place], [place] [address]  Creatinine, urine, random     Status: None   Collection Time: 03/08/18  6:57 PM  Result Value Ref Range   Creatinine, Urine 279.38 mg/dL    Comment: Performed at [facility], [place] [address], [place], [place] [address]   Dg Abd Acute W/chest  Result Date: 03/08/2018 CLINICAL DATA:  Initial evaluation for acute abdominal cramping, nausea, vomiting. EXAM: DG ABDOMEN ACUTE W/ 1V CHEST COMPARISON:  Prior ultrasound from 09/29/2017. FINDINGS: Cardiac and mediastinal silhouettes are within normal limits. Lungs are hyperinflated with attenuation of the pulmonary markings, suggesting emphysema. No focal infiltrates. No pulmonary edema or pleural effusion. No pneumothorax. Bowel gas pattern within normal limits without obstruction or ileus. No abnormal bowel wall thickening. No free air. No soft tissue mass. Punctate 2 mm calcific density overlies the right renal shadow, which may reflect a tiny renal calculus. No acute osseus abnormality. IMPRESSION: 1. Nonobstructive bowel gas pattern. 2. 2 mm calcific density overlying the right renal shadow, suspicious for right renal nephrolithiasis. 3. No active cardiopulmonary disease. 4. Emphysema. Electronically Signed   By: Jeannine Boga M.D.   On: 03/08/2018 16:29    Pending Labs [employer] (From admission, onward)   Start     Ordered   Signed and Held  Troponin I (q 6hr x 3)  Now then every 6 hours,   R     Signed and  Held   Visual merchandiser and Held  CK  Once,   R     Signed and Held   Signed and Held  HIV antibody  Tomorrow morning,   R     Signed and Held   Signed and Held  Magnesium  Tomorrow morning,   R    Comments:  Call MD if <1.5    Signed and Held   Visual merchandiser and Held  Phosphorus  Tomorrow morning,   R     Signed and Held   Signed and Held  TSH  Once,   R    Comments:  Cancel if already done within 1 month and notify MD    Signed and Held   Signed and Held  Comprehensive metabolic panel  Once,   R    Comments:  Cal MD for K<3.5 or >5.0    Signed and Held   Signed and Held  CBC  Once,   R    Comments:  Call for hg <8.0    Signed and Held   Signed and Held  Basic metabolic panel  Once,   STAT    Comments:  Call MD for K<3.5 or >5.0 and Na <125 or >150    Signed and Held      Vitals/Pain Today's Vitals   03/08/18 1513 03/08/18 1641 03/08/18 1915 03/08/18 1949  BP: (!) 136/93 Marland Kitchen)  135/92 109/69 131/82  Pulse: 90 93 76 80  Resp:  16 20 16   Temp:  98.4 F (36.9 C)    TempSrc:  Oral    SpO2: 96% 100% 100% 100%    Isolation Precautions No active isolations  Medications Medications  0.9 %  sodium chloride infusion ( Intravenous Stopped 03/08/18 1911)  sodium chloride 0.9 % bolus 1,000 mL (1,000 mLs Intravenous New Bag/Given 03/08/18 1850)  sodium chloride 0.9 % bolus 1,000 mL (1,000 mLs Intravenous New Bag/Given 03/08/18 1851)    Mobility walks

## 2018-03-08 NOTE — ED Notes (Signed)
Bed: WA17 Expected date:  Expected time:  Means of arrival:  Comments: HALL D

## 2018-03-08 NOTE — ED Notes (Signed)
EKG delayed, pt currently having scans done.

## 2018-03-08 NOTE — H&P (Addendum)
Henry Kramer FAO:[id number] DOB: August 22, 1971 DOA: 03/08/2018     PCP: Marliss Coots, NP   Outpatient Specialists:   NONE   Patient arrived to ER on 03/08/18 at 1457  Patient coming from: Homeless  chief Complaint:  Chief Complaint  Patient presents with  . Dehydration    HPI: Henry Kramer is a 47 y.o. male with medical history significant of polysubstance abuse, chronic nausea homelessness    Presented with Dark urine decreased urine output for past few weeks cramps everywhere in his muscles worsening for the past few days persistent nausea and vomiting no associated diarrhea EMS was called administered 1-1/2 L of normal saline on arrival heart rate 90 blood pressure 110/80 CBG 171 Last admission for the same in November 2018 at that time he was diagnosed with rhabdomyolysis and acute renal failure Otherwise no fevers or chills no chest pain or shortness of breath no abdominal pain   Reports he has been smoking some weed and probably makes nausea worse.   States has a niece with history of lupus While in ER: Patient initially was not urinating despite IV fluid rehydration  Following Medications were ordered in ER: Medications  0.9 %  sodium chloride infusion ( Intravenous New Bag/Given 03/08/18 1741)  sodium chloride 0.9 % bolus 1,000 mL (has no administration in time range)  sodium chloride 0.9 % bolus 1,000 mL (has no administration in time range)    Significant initial  Findings: Abnormal Labs Reviewed  COMPREHENSIVE METABOLIC PANEL - Abnormal; Notable for the following components:      Result Value   CO2 16 (*)    Glucose, Bld 124 (*)    BUN 52 (*)    Creatinine, Ser 5.73 (*)    Calcium 8.6 (*)    Total Protein 8.3 (*)    AST 45 (*)    GFR calc non Af Amer 11 (*)    GFR calc Af Amer 12 (*)    Anion gap 20 (*)    All other components within normal limits  CBC WITH DIFFERENTIAL/PLATELET - Abnormal; Notable for the following components:   WBC 17.7  (*)    RBC 5.89 (*)    Hemoglobin 17.9 (*)    MCHC 36.5 (*)    Neutro Abs 16.1 (*)    All other components within normal limits  CK - Abnormal; Notable for the following components:   Total CK 1,732 (*)    All other components within normal limits  TROPONIN I - Abnormal; Notable for the following components:   Troponin I 0.03 (*)    All other components within normal limits     Na 139 K 4.6  Cr    Up from baseline see below Lab Results  Component Value Date   CREATININE 5.73 (H) 03/08/2018   CREATININE 1.22 10/01/2017   CREATININE 2.91 (H) 09/30/2017      WBC  17.7  HG/HCT Up from baseline see below    Component Value Date/Time   HGB 17.9 (H) 03/08/2018 1704   HCT 49.0 03/08/2018 1704    Troponin  0.03     Lactic Acid, Venous    Component Value Date/Time   LATICACIDVEN 1.62 04/20/2014 1245      UA ordered    CXR -  NON acute KUB showing possible to 2.2 mm nephrolithiasis    ECG:  Personally reviewed by me showing: HR : 87 Rhythm:  NSR,    no evidence of ischemic changes  [id number]     ED Triage Vitals  Enc Vitals Group     BP 03/08/18 1513 (!) 136/93     Pulse Rate 03/08/18 1513 90     Resp 03/08/18 1641 16     Temp 03/08/18 1641 98.4 F (36.9 C)     Temp Source 03/08/18 1641 Oral     SpO2 03/08/18 1513 96 %     Weight --      Height --      Head Circumference --      Peak Flow --      Pain Score --      Pain Loc --      Pain Edu? --      Excl. in [place]? --   TMAX(24)@       Latest  Blood pressure (!) 135/92, pulse 93, temperature 98.4 F (36.9 C), temperature source Oral, resp. rate 16, SpO2 100 %.     Hospitalist was called for admission for acute renal failure dehydration and rhabdomyolysis   Review of Systems:    Pertinent positives include: fatigue, change in color of urine  Constitutional:  No weight loss, night sweats, Fevers, chills, , weight loss  HEENT:  No headaches, Difficulty swallowing,Tooth/dental problems,Sore  throat,  No sneezing, itching, ear ache, nasal congestion, post nasal drip,  Cardio-vascular:  No chest pain, Orthopnea, PND, anasarca, dizziness, palpitations.no Bilateral lower extremity swelling  GI:  No heartburn, indigestion, abdominal pain, nausea, vomiting, diarrhea, change in bowel habits, loss of appetite, melena, blood in stool, hematemesis Resp:  no shortness of breath at rest. No dyspnea on exertion, No excess mucus, no productive cough, No non-productive cough, No coughing up of blood.No change in color of mucus.No wheezing. Skin:  no rash or lesions. No jaundice GU:  no dysuria, , no urgency or frequency. No straining to urinate.  No flank pain.  Musculoskeletal:  No joint pain or no joint swelling. No decreased range of motion. No back pain.  Psych:  No change in mood or affect. No depression or anxiety. No memory loss.  Neuro: no localizing neurological complaints, no tingling, no weakness, no double vision, no gait abnormality, no slurred speech, no confusion  As per HPI otherwise 10 point review of systems negative.   Past Medical History:   Past Medical History:  Diagnosis Date  . AKI (acute kidney injury) ([place]) 05/22/2014  . Chronic nausea   . GERD (gastroesophageal reflux disease)   . Homelessness   . Marijuana use   . Substance abuse ([place])    tobacco, marijuana, EtOH      Past Surgical History:  Procedure Laterality Date  . NO PAST SURGERIES      Social History:  Ambulatory   independently       reports that he has been smoking cigarettes.  He has a 11.00 pack-year smoking history. He has never used smokeless tobacco. He reports that he drinks alcohol. He reports that he has current or past drug history. Drug: Marijuana.     Family History:   Family History  Problem Relation Age of Onset  . Cancer Mother   . Alcohol abuse Father   . Cirrhosis Father   . Cancer Sister   . Diabetes Brother   . Obesity Brother     Allergies: No Known  Allergies   Prior to Admission medications   Medication Sig Start Date End Date Taking? Authorizing Provider  pantoprazole (PROTONIX) 40 MG tablet Take 1 tablet (40 mg total) daily  by mouth. 10/01/17 10/01/18 Yes Barton Dubois, MD  feeding supplement, ENSURE ENLIVE, (ENSURE ENLIVE) LIQD Take 237 mLs 2 (two) times daily between meals by mouth. Patient not taking: Reported on 03/08/2018 10/01/17   Barton Dubois, MD  Multiple Vitamin (MULTIVITAMIN WITH MINERALS) TABS tablet Take 1 tablet daily by mouth. Patient not taking: Reported on 03/08/2018 10/02/17   Barton Dubois, MD  ondansetron (ZOFRAN ODT) 8 MG disintegrating tablet Take 1 tablet (8 mg total) every 8 (eight) hours as needed by mouth for nausea or vomiting. Patient not taking: Reported on 03/08/2018 10/01/17   Barton Dubois, MD  tamsulosin [facility] 0.4 MG CAPS capsule Take 1 capsule (0.4 mg total) by mouth daily after breakfast. Patient not taking: Reported on 03/08/2018 09/04/16   Jule Ser, DO   Physical Exam: Blood pressure (!) 135/92, pulse 93, temperature 98.4 F (36.9 C), temperature source Oral, resp. rate 16, SpO2 100 %. 1. General:  in No Acute distress Disheveled -appearing 2. Psychological: Alert and   Oriented 3. Head/ENT:    Dry Mucous Membranes                          Head Non traumatic, neck supple                            Poor Dentition 4. SKIN:  decreased Skin turgor,  Skin clean Dry and intact no rash 5. Heart: Regular rate and rhythm no  Murmur, no Rub or gallop 6. Lungs:   no wheezes or crackles   7. Abdomen: Soft, non-tender, Non distended  bowel sounds present 8. Lower extremities: no clubbing, cyanosis, or edema 9. Neurologically Grossly intact, moving all 4 extremities equally   10. MSK: Normal range of motion   LABS:     Recent Labs  Lab 03/08/18 1704  WBC 17.7*  NEUTROABS 16.1*  HGB 17.9*  HCT 49.0  MCV 83.2  PLT 671   Basic Metabolic Panel: Recent Labs  Lab 03/08/18 1704  NA  139  K 4.6  CL 103  CO2 16*  GLUCOSE 124*  BUN 52*  CREATININE 5.73*  CALCIUM 8.6*     Recent Labs  Lab 03/08/18 1704  AST 45*  ALT 18  ALKPHOS 62  BILITOT 0.9  PROT 8.3*  ALBUMIN 4.8   Recent Labs  Lab 03/08/18 1704  LIPASE 37   No results for input(s): AMMONIA in the last 168 hours.    HbA1C: No results for input(s): HGBA1C in the last 72 hours. CBG: No results for input(s): GLUCAP in the last 168 hours.      Cultures:    Component Value Date/Time   SDES URINE, CATHETERIZED 09/30/2017 2000   SPECREQUEST NONE 09/30/2017 2000   CULT MULTIPLE SPECIES PRESENT, SUGGEST RECOLLECTION (A) 09/30/2017 2000   REPTSTATUS 10/03/2017 FINAL 09/30/2017 2000     Radiological Exams on Admission: Dg Abd Acute W/chest  Result Date: 03/08/2018 CLINICAL DATA:  Initial evaluation for acute abdominal cramping, nausea, vomiting. EXAM: DG ABDOMEN ACUTE W/ 1V CHEST COMPARISON:  Prior ultrasound from 09/29/2017. FINDINGS: Cardiac and mediastinal silhouettes are within normal limits. Lungs are hyperinflated with attenuation of the pulmonary markings, suggesting emphysema. No focal infiltrates. No pulmonary edema or pleural effusion. No pneumothorax. Bowel gas pattern within normal limits without obstruction or ileus. No abnormal bowel wall thickening. No free air. No soft tissue mass. Punctate 2 mm calcific density overlies the right renal  shadow, which may reflect a tiny renal calculus. No acute osseus abnormality. IMPRESSION: 1. Nonobstructive bowel gas pattern. 2. 2 mm calcific density overlying the right renal shadow, suspicious for right renal nephrolithiasis. 3. No active cardiopulmonary disease. 4. Emphysema. Electronically Signed   By: Jeannine Boga M.D.   On: 03/08/2018 16:29    Chart has been reviewed    Assessment/Plan   47 y.o. male with medical history significant of polysubstance abuse, chronic nausea remittent homelessness Admitted for acute renal failure dehydration  rhabdomyolysis  Present on Admission:  . Acute renal failure (ARF) ([facility]) likely secondary to dehydration secondary to recurrent nausea and vomiting will rehydrate follow kidney function obtain renal ultrasound if no significant improvement will need nephrology consult currently no evidence of fluid overload or hyperkalemia . Chronic nausea -suspect cyclic vomiting secondary to marijuana abuse patient is point interested in quitting . Rhabdomyolysis -will rehydrate and follow CK . Elevated troponin mild no chest pain or shortness of breath we will continue to cycle cardiac markers monitor on telemetry  . Depression patient is tearful describing problems with anger and guilt patient is tearful.  Not endorsing any suicidal ideations or homicidal ideations at this time would behavioral health consult would probably benefit from outpatient follow-up  . Dehydration we will rehydrate Homelessness currently states he has a place to stay but unsure if he will be able to hold onto it  Leukocytosis most likely secondary to hemoconcentration no evidence of infectious etiology at this point  Other plan as per orders.  DVT prophylaxis: Lovenox  Code Status:  FULL CODE as per patient      Family Communication:   Family not at  Bedside     Disposition Plan: Home                                      Consults called: behavioral health    Admission status:   inpatient      Level of care    tele           Toy Baker 03/08/2018, 8:30 PM    [facility]  Pager [phone]   after 2 AM please page floor coverage PA If 7AM-7PM, please contact the day team taking care of the patient  [email or web address]  Password [email or web address]

## 2018-03-08 NOTE — ED Notes (Signed)
Pt aware that a urine sample is needed. Pt given urinal and ask to alert staff when he is able to provide a sample.

## 2018-03-08 NOTE — ED Triage Notes (Signed)
Per EMS, pt complains of dark urine, difficulty urinating over the past few weeks. Pt complains of cramping over the past couple of days. Pt has some n/v, denies diarrhea. Pt given 1.5 liters NS.   HR 90 BP 110/80 CBG 171 O2 95%

## 2018-03-09 ENCOUNTER — Other Ambulatory Visit: Payer: Self-pay

## 2018-03-09 DIAGNOSIS — Z811 Family history of alcohol abuse and dependence: Secondary | ICD-10-CM

## 2018-03-09 DIAGNOSIS — F1290 Cannabis use, unspecified, uncomplicated: Secondary | ICD-10-CM

## 2018-03-09 DIAGNOSIS — F17210 Nicotine dependence, cigarettes, uncomplicated: Secondary | ICD-10-CM

## 2018-03-09 LAB — BASIC METABOLIC PANEL
ANION GAP: 8 (ref 5–15)
BUN: 49 mg/dL — ABNORMAL HIGH (ref 6–20)
CHLORIDE: 110 mmol/L (ref 101–111)
CO2: 21 mmol/L — ABNORMAL LOW (ref 22–32)
Calcium: 7.8 mg/dL — ABNORMAL LOW (ref 8.9–10.3)
Creatinine, Ser: 2.66 mg/dL — ABNORMAL HIGH (ref 0.61–1.24)
GFR calc Af Amer: 31 mL/min — ABNORMAL LOW (ref >60)
GFR calc non Af Amer: 27 mL/min — ABNORMAL LOW (ref >60)
GLUCOSE: 101 mg/dL — AB (ref 65–99)
POTASSIUM: 4.1 mmol/L (ref 3.5–5.1)
Sodium: 139 mmol/L (ref 135–145)

## 2018-03-09 LAB — COMPREHENSIVE METABOLIC PANEL
ALBUMIN: 4 g/dL (ref 3.5–5.0)
ALT: 21 U/L (ref 17–63)
ANION GAP: 9 (ref 5–15)
AST: 63 U/L — ABNORMAL HIGH (ref 15–41)
Alkaline Phosphatase: 51 U/L (ref 38–126)
BILIRUBIN TOTAL: 0.5 mg/dL (ref 0.3–1.2)
BUN: 48 mg/dL — ABNORMAL HIGH (ref 6–20)
CO2: 21 mmol/L — ABNORMAL LOW (ref 22–32)
Calcium: 8.1 mg/dL — ABNORMAL LOW (ref 8.9–10.3)
Chloride: 109 mmol/L (ref 101–111)
Creatinine, Ser: 2.63 mg/dL — ABNORMAL HIGH (ref 0.61–1.24)
GFR calc Af Amer: 32 mL/min — ABNORMAL LOW (ref >60)
GFR, EST NON AFRICAN AMERICAN: 27 mL/min — AB (ref >60)
Glucose, Bld: 100 mg/dL — ABNORMAL HIGH (ref 65–99)
POTASSIUM: 4 mmol/L (ref 3.5–5.1)
Sodium: 139 mmol/L (ref 135–145)
TOTAL PROTEIN: 6.6 g/dL (ref 6.5–8.1)

## 2018-03-09 LAB — CBC
HCT: 41.3 % (ref 39.0–52.0)
HEMOGLOBIN: 14.8 g/dL (ref 13.0–17.0)
MCH: 29.7 pg (ref 26.0–34.0)
MCHC: 35.8 g/dL (ref 30.0–36.0)
MCV: 82.9 fL (ref 78.0–100.0)
Platelets: 200 10*3/uL (ref 150–400)
RBC: 4.98 MIL/uL (ref 4.22–5.81)
RDW: 13.5 % (ref 11.5–15.5)
WBC: 15 10*3/uL — AB (ref 4.0–10.5)

## 2018-03-09 LAB — MAGNESIUM: MAGNESIUM: 2.3 mg/dL (ref 1.7–2.4)

## 2018-03-09 LAB — HIV ANTIBODY (ROUTINE TESTING W REFLEX): HIV Screen 4th Generation wRfx: NONREACTIVE

## 2018-03-09 LAB — CK: CK TOTAL: 2756 U/L — AB (ref 49–397)

## 2018-03-09 LAB — TROPONIN I
Troponin I: 0.07 ng/mL (ref <0.03)
Troponin I: 0.04 ng/mL (ref <0.03)

## 2018-03-09 LAB — TSH: TSH: 0.509 u[IU]/mL (ref 0.350–4.500)

## 2018-03-09 LAB — PHOSPHORUS: Phosphorus: 3.8 mg/dL (ref 2.5–4.6)

## 2018-03-09 MED ORDER — ENSURE ENLIVE PO LIQD
237.0000 mL | Freq: Three times a day (TID) | ORAL | Status: DC
  Administered 2018-03-09 – 2018-03-10 (×3): 237 mL via ORAL

## 2018-03-09 NOTE — Progress Notes (Signed)
PROGRESS NOTE [facility]   GARRET TEALE   VQM:[id number] DOB: Dec 09, 1970  DOA: 03/08/2018 PCP: Marliss Coots, NP   Brief Narrative:  Henry Kramer is a 47 year old male with medical history of chronic nausea, polysubstance abuse and anxiety.  Presented to the emergency department complaining of dark urine and decreased urine output.  Associated symptoms included whole body cramps with persistent nausea and vomiting for a few days prior to admissions.  Upon ED evaluation patient creatinine was significantly elevateds to 5, CK elevated, WBC 17.7 and hemoglobin/hematocrit elevated as well.  Patient was admitted with working diagnosis of rhabdomyolysis complicated by acute renal failure from dehydration.  Of note patient has history of sickle cell trait  Subjective: Patient seen and examined, feeling significantly better.  Muscle cramping has resolved.  Urine started to clear out.  Denies chest pain, shortness of breath and palpitation.  Assessment & Plan: Rhabdomyolysis ?  Secondary to muscle cramping from sickle cell trait after being dehydrated from nausea and vomiting. Trend CK, aggressive hydration NS at 175 cc/h Patient with good urine output.  Acute renal failure Prerenal secondary to dehydration from N/V and rhabdomyolysis Creatinine significantly improved after IV hydration Renal ultrasound shows signs of renal disease Continue IV fluids and monitor renal function.   Elevated troponin Likely demand ischemia, troponin trending down, no EKG changes.  Patient asymptomatic. Will obtain echocardiogram for completion purposes  Chronic nausea Suspect cyclic vomiting due to marijuana abuse, supportive treatment.  Cessation discussed  Depression/anxiety No SI/HI, psychiatry was consulted and recommended to start [employer] worker consulted  DVT prophylaxis: Lovenox Code Status: Full code Family Communication: None at bedside Disposition Plan: Home when  renal function and CK stabilized  Consultants:   Psychiatry  Procedures:   None  Antimicrobials:  None   Objective: Vitals:   03/09/18 0507 03/09/18 0510 03/09/18 1235 03/09/18 1823  BP: 115/65 128/86 122/75 131/79  Pulse: 74 62 60 (!) 56  Resp: 14 16 19 18   Temp: 98.9 F (37.2 C) 98.2 F (36.8 C) 98.2 F (36.8 C)   TempSrc: Oral Oral Oral   SpO2: 95% 95% 99% 100%  Weight:      Height:        Intake/Output Summary (Last 24 hours) at 03/09/2018 1829 Last data filed at 03/09/2018 1822 Gross per 24 hour  Intake 2393.33 ml  Output 2900 ml  Net -506.67 ml   Filed Weights   03/08/18 2102  Weight: 61.6 kg (135 lb 12.8 oz)    Examination:  General exam: Appears calm and comfortable  HEENT: AC/AT, PERRLA, OP moist and clear Respiratory system: Clear to auscultation. No wheezes,crackle or rhonchi Cardiovascular system: S1 & S2 heard, RRR. No JVD, murmurs, rubs or gallops Gastrointestinal system: Abdomen is nondistended, soft and nontender. No organomegaly or masses felt. Normal bowel sounds heard. Central nervous system: Alert and oriented. No focal neurological deficits. Extremities: No pedal edema. Symmetric, strength 5/5   Skin: No rashes, lesions or ulcers Psychiatry: Judgement and insight appear normal. Mood & affect appropriate.    Data Reviewed: I have personally reviewed following labs and imaging studies  CBC: Recent Labs  Lab 03/08/18 1704 03/09/18 0522  WBC 17.7* 15.0*  NEUTROABS 16.1*  --   HGB 17.9* 14.8  HCT 49.0 41.3  MCV 83.2 82.9  PLT 248 932   Basic Metabolic Panel: Recent Labs  Lab 03/08/18 1704 03/09/18 0522  NA 139 139  139  K 4.6 4.1  4.0  CL 103  110  109  CO2 16* 21*  21*  GLUCOSE 124* 101*  100*  BUN 52* 49*  48*  CREATININE 5.73* 2.66*  2.63*  CALCIUM 8.6* 7.8*  8.1*  MG  --  2.3  PHOS  --  3.8   GFR: Estimated Creatinine Clearance: 30.3 mL/min (A) (by C-G formula based on SCr of 2.63 mg/dL (H)). Liver  Function Tests: Recent Labs  Lab 03/08/18 1704 03/09/18 0522  AST 45* 63*  ALT 18 21  ALKPHOS 62 51  BILITOT 0.9 0.5  PROT 8.3* 6.6  ALBUMIN 4.8 4.0   Recent Labs  Lab 03/08/18 1704  LIPASE 37   No results for input(s): AMMONIA in the last 168 hours. Coagulation Profile: No results for input(s): INR, PROTIME in the last 168 hours. Cardiac Enzymes: Recent Labs  Lab 03/08/18 1704 03/08/18 2312 03/09/18 0522 03/09/18 1021  CKTOTAL 1,732*  --  2,756*  --   TROPONINI 0.03* 0.08* 0.07* 0.04*   BNP (last 3 results) No results for input(s): PROBNP in the last 8760 hours. HbA1C: No results for input(s): HGBA1C in the last 72 hours. CBG: No results for input(s): GLUCAP in the last 168 hours. Lipid Profile: No results for input(s): CHOL, HDL, LDLCALC, TRIG, CHOLHDL, LDLDIRECT in the last 72 hours. Thyroid Function Tests: Recent Labs    03/09/18 0522  TSH 0.509   Anemia Panel: No results for input(s): VITAMINB12, FOLATE, FERRITIN, TIBC, IRON, RETICCTPCT in the last 72 hours. Sepsis Labs: No results for input(s): PROCALCITON, LATICACIDVEN in the last 168 hours.  No results found for this or any previous visit (from the past 240 hour(s)).    Radiology Studies: US Renal  Result Date: 03/08/2018 CLINICAL DATA:  Acute renal failure EXAM: RENAL / URINARY TRACT ULTRASOUND COMPLETE COMPARISON:  09/29/2017 FINDINGS: Right Kidney: Length: 10.6 cm.  Echogenic cortex.  No hydronephrosis Left Kidney: Length: 10.2 cm.  Echogenic cortex.  No hydronephrosis Bladder: Appears normal for degree of bladder distention. IMPRESSION: 1. Negative for hydronephrosis 2. Echogenic kidneys bilaterally consistent with medical renal disease Electronically Signed   By: Donavan Foil M.D.   On: 03/08/2018 20:50   Dg Abd Acute W/chest  Result Date: 03/08/2018 CLINICAL DATA:  Initial evaluation for acute abdominal cramping, nausea, vomiting. EXAM: DG ABDOMEN ACUTE W/ 1V CHEST COMPARISON:  Prior  ultrasound from 09/29/2017. FINDINGS: Cardiac and mediastinal silhouettes are within normal limits. Lungs are hyperinflated with attenuation of the pulmonary markings, suggesting emphysema. No focal infiltrates. No pulmonary edema or pleural effusion. No pneumothorax. Bowel gas pattern within normal limits without obstruction or ileus. No abnormal bowel wall thickening. No free air. No soft tissue mass. Punctate 2 mm calcific density overlies the right renal shadow, which may reflect a tiny renal calculus. No acute osseus abnormality. IMPRESSION: 1. Nonobstructive bowel gas pattern. 2. 2 mm calcific density overlying the right renal shadow, suspicious for right renal nephrolithiasis. 3. No active cardiopulmonary disease. 4. Emphysema. Electronically Signed   By: Jeannine Boga M.D.   On: 03/08/2018 16:29      Scheduled Meds: . enoxaparin (LOVENOX) injection  30 mg Subcutaneous Q24H  . feeding supplement (ENSURE ENLIVE)  237 mL Oral TID BM  . pantoprazole  40 mg Oral Daily  . thiamine  100 mg Oral Daily   Continuous Infusions: . sodium chloride 175 mL/hr at 03/09/18 1350     LOS: 1 day    Time spent: Total of 35 minutes spent with pt, greater than 50% of which was spent  in discussion of  treatment, counseling and coordination of care  Chipper Oman, MD Pager: Text Page via [email or web address]   If 7PM-7AM, please contact night-coverage [email or web address] 03/09/2018, 6:29 PM   Note - This record has been created using [employer]. Chart creation errors have been sought, but may not always have been located. Such creation errors do not reflect on the standard of medical care.
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
## 2018-03-09 NOTE — Progress Notes (Signed)
CSW received consult for homelessness issues. Met with pt at bedside- he was alert, oriented x4, pleasant. Reports, "Last time I was here I was homeless, but I'm living in an apartment at [facility] now." Pt reports he may need help with transportation home due to lack of supports/financial issues.  CSW will follow for needs during hospitalization.  Sharren Bridge, MSW, LCSW Clinical Social Work 03/09/2018 [phone]

## 2018-03-09 NOTE — Progress Notes (Signed)
Initial Nutrition Assessment  DOCUMENTATION CODES:   Underweight, Severe malnutrition in context of social or environmental circumstances  INTERVENTION:   Ensure Enlive po TID, each supplement provides 350 kcal and 20 grams of protein  NUTRITION DIAGNOSIS:   Severe Malnutrition related to social / environmental circumstances(polysubstance abuse) as evidenced by energy intake < or equal to 50% for > or equal to 1 month, moderate fat depletion, severe fat depletion, moderate muscle depletion, severe muscle depletion.  GOAL:   Patient will meet greater than or equal to 90% of their needs  MONITOR:   PO intake, Supplement acceptance, Weight trends, Labs  REASON FOR ASSESSMENT:   Malnutrition Screening Tool    ASSESSMENT:   Patient with PMH significant for polysubstance abuse, chronic nausea, and homelessness. Presents this admission with acute renal failure with rhabdomyolysis.    Spoke with pt at bedside. Report having decreased PO intake  for > 3 months due to decreasing appetite. States some days the smell of food will make him feel nauseous and other days he can tolerate it. He typically eats one meal  that consist of different things like sandwiches or chicken. Pt has used protein supplements like Ensure or Boost. He prefers these over eating because he tolerates them well. Discussed the importance of protein intake with supporting calories for preservation of lean body mass. Encouraged pt to increase his Boost/Ensure consumption to 3-4 times/day of his poor appetite persist. RD observed lunch tray 100% completed and two empty Boost Breeze at bedside. Meal completions charted as 100% for his last two meals.   Pt endorses a UBW of 165 lb. Records indicate pt weighed 136 lb 09/29/18 and 135 lb this admission. Nutrition-Focused physical exam completed. Pt continues to meet criteria for malnutrition per ASPEN guidelines.   Medications reviewed and include: thiamine, NS @ 175  ml/hr Labs reviewed: BUN 48 (H) Creatinine 2.63 (H) AST 63 (H) GFR 27   NUTRITION - FOCUSED PHYSICAL EXAM:    Most Recent Value  Orbital Region  Mild depletion  Upper Arm Region  Severe depletion  Thoracic and Lumbar Region  Severe depletion  Buccal Region  Mild depletion  Temple Region  Moderate depletion  Clavicle Bone Region  Severe depletion  Scapular Bone Region  Severe depletion  Dorsal Hand  Mild depletion  Patellar Region  Severe depletion  Anterior Thigh Region  Severe depletion  Posterior Calf Region  Severe depletion  Edema (RD Assessment)  None     Diet Order:  Diet Heart Room service appropriate? Yes; Fluid consistency: Thin  EDUCATION NEEDS:   Education needs have been addressed  Skin:  Skin Assessment: Reviewed RN Assessment  Last BM:  PTA  Height:   Ht Readings from Last 1 Encounters:  03/08/18 6\' 3"  (1.905 m)    Weight:   Wt Readings from Last 1 Encounters:  03/08/18 135 lb 12.8 oz (61.6 kg)    Ideal Body Weight:  89.1 kg  BMI:  Body mass index is 16.97 kg/m.  Estimated Nutritional Needs:   Kcal:  2225-2425 kcal  Protein:  110-120 g  Fluid:  >2.2 L/day    Mariana Single RD, LDN Clinical Nutrition Pager # - [phone]

## 2018-03-10 ENCOUNTER — Inpatient Hospital Stay (HOSPITAL_COMMUNITY): Payer: Self-pay

## 2018-03-10 DIAGNOSIS — R748 Abnormal levels of other serum enzymes: Secondary | ICD-10-CM

## 2018-03-10 DIAGNOSIS — M6282 Rhabdomyolysis: Secondary | ICD-10-CM

## 2018-03-10 DIAGNOSIS — E860 Dehydration: Secondary | ICD-10-CM

## 2018-03-10 DIAGNOSIS — F329 Major depressive disorder, single episode, unspecified: Secondary | ICD-10-CM

## 2018-03-10 DIAGNOSIS — R7989 Other specified abnormal findings of blood chemistry: Secondary | ICD-10-CM

## 2018-03-10 DIAGNOSIS — N179 Acute kidney failure, unspecified: Principal | ICD-10-CM

## 2018-03-10 LAB — CBC WITH DIFFERENTIAL/PLATELET
BASOS PCT: 0 %
Basophils Absolute: 0 10*3/uL (ref 0.0–0.1)
EOS ABS: 0 10*3/uL (ref 0.0–0.7)
EOS PCT: 0 %
HCT: 32.7 % — ABNORMAL LOW (ref 39.0–52.0)
HEMOGLOBIN: 11.4 g/dL — AB (ref 13.0–17.0)
LYMPHS ABS: 1.1 10*3/uL (ref 0.7–4.0)
Lymphocytes Relative: 11 %
MCH: 29.4 pg (ref 26.0–34.0)
MCHC: 34.9 g/dL (ref 30.0–36.0)
MCV: 84.3 fL (ref 78.0–100.0)
MONOS PCT: 11 %
Monocytes Absolute: 1.1 10*3/uL — ABNORMAL HIGH (ref 0.1–1.0)
NEUTROS PCT: 78 %
Neutro Abs: 8 10*3/uL — ABNORMAL HIGH (ref 1.7–7.7)
PLATELETS: 157 10*3/uL (ref 150–400)
RBC: 3.88 MIL/uL — ABNORMAL LOW (ref 4.22–5.81)
RDW: 13.6 % (ref 11.5–15.5)
WBC: 10.2 10*3/uL (ref 4.0–10.5)

## 2018-03-10 LAB — COMPREHENSIVE METABOLIC PANEL
ALBUMIN: 3.2 g/dL — AB (ref 3.5–5.0)
ALK PHOS: 41 U/L (ref 38–126)
ALT: 25 U/L (ref 17–63)
ANION GAP: 6 (ref 5–15)
AST: 71 U/L — ABNORMAL HIGH (ref 15–41)
BUN: 27 mg/dL — ABNORMAL HIGH (ref 6–20)
CHLORIDE: 111 mmol/L (ref 101–111)
CO2: 23 mmol/L (ref 22–32)
Calcium: 7.9 mg/dL — ABNORMAL LOW (ref 8.9–10.3)
Creatinine, Ser: 0.98 mg/dL (ref 0.61–1.24)
GFR calc non Af Amer: >60 mL/min (ref >60)
GLUCOSE: 93 mg/dL (ref 65–99)
POTASSIUM: 3.9 mmol/L (ref 3.5–5.1)
SODIUM: 140 mmol/L (ref 135–145)
Total Bilirubin: 0.6 mg/dL (ref 0.3–1.2)
Total Protein: 5.5 g/dL — ABNORMAL LOW (ref 6.5–8.1)

## 2018-03-10 LAB — CK: Total CK: 2280 U/L — ABNORMAL HIGH (ref 49–397)

## 2018-03-10 LAB — ANA W/REFLEX IF POSITIVE: ANA: NEGATIVE

## 2018-03-10 LAB — MAGNESIUM: Magnesium: 1.9 mg/dL (ref 1.7–2.4)

## 2018-03-10 LAB — ECHOCARDIOGRAM COMPLETE
HEIGHTINCHES: 75 in
WEIGHTICAEL: 2172.8 [oz_av]

## 2018-03-10 LAB — ANTI-DNA ANTIBODY, DOUBLE-STRANDED

## 2018-03-10 MED ORDER — ENOXAPARIN SODIUM 40 MG/0.4ML ~~LOC~~ SOLN: 40.0000 mg | SUBCUTANEOUS | Status: DC

## 2018-03-10 NOTE — Progress Notes (Signed)
CM consult for PCP. Pt sees Audrea Muscat Placey at the [facility]. He should follow up with her at discharge.

## 2018-03-10 NOTE — Progress Notes (Signed)
  Echocardiogram 2D Echocardiogram has been performed.  Henry Kramer 03/10/2018, 2:22 PM

## 2018-03-10 NOTE — Discharge Summary (Signed)
Physician Discharge Summary  Henry Kramer ACZ:[id number] DOB: May 18, 1971 DOA: 03/08/2018  PCP: Marliss Coots, NP  Admit date: 03/08/2018 Discharge date: 03/10/2018  Admitted From: HOME  Disposition:  HOME.   Recommendations for Outpatient Follow-up:  1. Follow up with PCP in 1-2 weeks 2. Please obtain BMP/CBC in one week    Discharge Condition: stable.  CODE STATUS:full code.  Diet recommendation: Heart Healthy  Brief/Interim Summary: Henry Kramer is a 47 year old male with medical history of chronic nausea, polysubstance abuse and anxiety.  Presented to the emergency department complaining of dark urine and decreased urine output.  Associated symptoms included whole body cramps with persistent nausea and vomiting for a few days prior to admissions.  Upon ED evaluation patient creatinine was significantly elevateds to 5, CK elevated, WBC 17.7 and hemoglobin/hematocrit elevated as well.  Patient was admitted with working diagnosis of rhabdomyolysis complicated by acute renal failure from dehydration.    Discharge Diagnoses:  Principal Problem:   Depression Active Problems:   Chronic nausea   History of substance abuse   AKI (acute kidney injury) (HCC)   Rhabdomyolysis   Elevated troponin   Acute renal failure (ARF) (HCC)   Dehydration  Rhabdomyolysis ?  Secondary to muscle cramping from sickle cell trait after being dehydrated from nausea and vomiting. Aggressive hydration, with improvement of symptoms.  Patient with good urine output.  Acute renal failure Prerenal secondary to dehydration from N/V and rhabdomyolysis Creatinine significantly improved after IV hydration Renal ultrasound shows signs of renal disease Recommend outpatient follow up with repeat renal parameters in one week.   Elevated troponin Likely demand ischemia, troponin trending down, no EKG changes.  Patient asymptomatic. Echocardiogram done , unremarkable.   Chronic  nausea Suspect cyclic vomiting due to marijuana abuse, supportive treatment.  Cessation discussed  Depression/anxiety No SI/HI, psychiatry was consulted and recommended to start [place] worker consulted    Discharge Instructions  Discharge Instructions    Diet - low sodium heart healthy   Complete by:  As directed    Discharge instructions   Complete by:  As directed    Please follow upwith PCP in one week.     Allergies as of 03/10/2018   No Known Allergies     Medication List    STOP taking these medications   ondansetron 8 MG disintegrating tablet Commonly known as:  ZOFRAN ODT   tamsulosin 0.4 MG Caps capsule Commonly known as:  FLOMAX     TAKE these medications   feeding supplement (ENSURE ENLIVE) Liqd Take 237 mLs 2 (two) times daily between meals by mouth.   multivitamin with minerals Tabs tablet Take 1 tablet daily by mouth.   pantoprazole 40 MG tablet Commonly known as:  PROTONIX Take 1 tablet (40 mg total) daily by mouth.      Follow-up Information    Placey, Audrea Muscat, NP. Schedule an appointment as soon as possible for a visit in 1 week(s).   Contact information: [address] [place] [address] [phone]          No Known Allergies  Consultations:  None.    Procedures/Studies: US Renal  Result Date: 03/08/2018 CLINICAL DATA:  Acute renal failure EXAM: RENAL / URINARY TRACT ULTRASOUND COMPLETE COMPARISON:  09/29/2017 FINDINGS: Right Kidney: Length: 10.6 cm.  Echogenic cortex.  No hydronephrosis Left Kidney: Length: 10.2 cm.  Echogenic cortex.  No hydronephrosis Bladder: Appears normal for degree of bladder distention. IMPRESSION: 1. Negative for hydronephrosis 2. Echogenic  kidneys bilaterally consistent with medical renal disease Electronically Signed   By: Donavan Foil M.D.   On: 03/08/2018 20:50   Dg Abd Acute W/chest  Result Date: 03/08/2018 CLINICAL DATA:  Initial evaluation for acute abdominal cramping, nausea,  vomiting. EXAM: DG ABDOMEN ACUTE W/ 1V CHEST COMPARISON:  Prior ultrasound from 09/29/2017. FINDINGS: Cardiac and mediastinal silhouettes are within normal limits. Lungs are hyperinflated with attenuation of the pulmonary markings, suggesting emphysema. No focal infiltrates. No pulmonary edema or pleural effusion. No pneumothorax. Bowel gas pattern within normal limits without obstruction or ileus. No abnormal bowel wall thickening. No free air. No soft tissue mass. Punctate 2 mm calcific density overlies the right renal shadow, which may reflect a tiny renal calculus. No acute osseus abnormality. IMPRESSION: 1. Nonobstructive bowel gas pattern. 2. 2 mm calcific density overlying the right renal shadow, suspicious for right renal nephrolithiasis. 3. No active cardiopulmonary disease. 4. Emphysema. Electronically Signed   By: Jeannine Boga M.D.   On: 03/08/2018 16:29       Subjective: No new complaints.   Discharge Exam: Vitals:   03/09/18 2213 03/10/18 0522  BP: 127/76 118/71  Pulse: 66 (!) 53  Resp: 17 18  Temp: 99.4 F (37.4 C) 98.2 F (36.8 C)  SpO2: 98% 98%   Vitals:   03/09/18 1235 03/09/18 1823 03/09/18 2213 03/10/18 0522  BP: 122/75 131/79 127/76 118/71  Pulse: 60 (!) 56 66 (!) 53  Resp: 19 18 17 18   Temp: 98.2 F (36.8 C)  99.4 F (37.4 C) 98.2 F (36.8 C)  TempSrc: Oral  Oral Oral  SpO2: 99% 100% 98% 98%  Weight:      Height:        General: Pt is alert, awake, not in acute distress Cardiovascular: RRR, S1/S2 +, no rubs, no gallops Respiratory: CTA bilaterally, no wheezing, no rhonchi Abdominal: Soft, NT, ND, bowel sounds + Extremities: no edema, no cyanosis    The results of significant diagnostics from this hospitalization (including imaging, microbiology, ancillary and laboratory) are listed below for reference.     Microbiology: No results found for this or any previous visit (from the past 240 hour(s)).   Labs: BNP (last 3 results) No results  for input(s): BNP in the last 8760 hours. Basic Metabolic Panel: Recent Labs  Lab 03/08/18 1704 03/09/18 0522 03/10/18 0439  NA 139 139  139 140  K 4.6 4.1  4.0 3.9  CL 103 110  109 111  CO2 16* 21*  21* 23  GLUCOSE 124* 101*  100* 93  BUN 52* 49*  48* 27*  CREATININE 5.73* 2.66*  2.63* 0.98  CALCIUM 8.6* 7.8*  8.1* 7.9*  MG  --  2.3 1.9  PHOS  --  3.8  --    Liver Function Tests: Recent Labs  Lab 03/08/18 1704 03/09/18 0522 03/10/18 0439  AST 45* 63* 71*  ALT 18 21 25   ALKPHOS 62 51 41  BILITOT 0.9 0.5 0.6  PROT 8.3* 6.6 5.5*  ALBUMIN 4.8 4.0 3.2*   Recent Labs  Lab 03/08/18 1704  LIPASE 37   No results for input(s): AMMONIA in the last 168 hours. CBC: Recent Labs  Lab 03/08/18 1704 03/09/18 0522 03/10/18 0439  WBC 17.7* 15.0* 10.2  NEUTROABS 16.1*  --  8.0*  HGB 17.9* 14.8 11.4*  HCT 49.0 41.3 32.7*  MCV 83.2 82.9 84.3  PLT 248 200 157   Cardiac Enzymes: Recent Labs  Lab 03/08/18 1704 03/08/18 2312 03/09/18 0522  03/09/18 1021 03/10/18 0439  CKTOTAL 1,732*  --  2,756*  --  2,280*  TROPONINI 0.03* 0.08* 0.07* 0.04*  --    BNP: Invalid input(s): POCBNP CBG: No results for input(s): GLUCAP in the last 168 hours. D-Dimer No results for input(s): DDIMER in the last 72 hours. Hgb A1c No results for input(s): HGBA1C in the last 72 hours. Lipid Profile No results for input(s): CHOL, HDL, LDLCALC, TRIG, CHOLHDL, LDLDIRECT in the last 72 hours. Thyroid function studies Recent Labs    03/09/18 0522  TSH 0.509   Anemia work up No results for input(s): VITAMINB12, FOLATE, FERRITIN, TIBC, IRON, RETICCTPCT in the last 72 hours. Urinalysis    Component Value Date/Time   COLORURINE AMBER (A) 03/08/2018 1852   APPEARANCEUR CLOUDY (A) 03/08/2018 1852   APPEARANCEUR Clear 07/30/2016 1522   LABSPEC 1.018 03/08/2018 1852   PHURINE 5.0 03/08/2018 1852   GLUCOSEU NEGATIVE 03/08/2018 1852   HGBUR LARGE (A) 03/08/2018 1852   BILIRUBINUR NEGATIVE  03/08/2018 1852   BILIRUBINUR Negative 07/30/2016 1522   KETONESUR NEGATIVE 03/08/2018 1852   PROTEINUR 100 (A) 03/08/2018 1852   UROBILINOGEN 0.2 01/15/2015 1821   NITRITE NEGATIVE 03/08/2018 1852   LEUKOCYTESUR TRACE (A) 03/08/2018 1852   LEUKOCYTESUR Negative 07/30/2016 1522   Sepsis Labs Invalid input(s): PROCALCITONIN,  WBC,  LACTICIDVEN Microbiology No results found for this or any previous visit (from the past 240 hour(s)).   Time coordinating discharge: 31 minutes  SIGNED:   Hosie Poisson, MD  [facility] 03/10/2018, 4:09 PM Pager   If 7PM-7AM, please contact night-coverage [email or web address] Password [email or web address]

## 2018-03-29 ENCOUNTER — Encounter (HOSPITAL_COMMUNITY): Payer: Self-pay | Admitting: Emergency Medicine

## 2018-03-29 ENCOUNTER — Inpatient Hospital Stay (HOSPITAL_COMMUNITY)
Admission: EM | Admit: 2018-03-29 | Discharge: 2018-03-31 | DRG: 683 | Disposition: A | Discharge status: Home / Self Care | Payer: Self-pay | Attending: Internal Medicine | Admitting: Internal Medicine

## 2018-03-29 DIAGNOSIS — N179 Acute kidney failure, unspecified: Principal | ICD-10-CM | POA: Diagnosis present

## 2018-03-29 DIAGNOSIS — R110 Nausea: Secondary | ICD-10-CM | POA: Diagnosis present

## 2018-03-29 DIAGNOSIS — K219 Gastro-esophageal reflux disease without esophagitis: Secondary | ICD-10-CM | POA: Diagnosis present

## 2018-03-29 DIAGNOSIS — F1210 Cannabis abuse, uncomplicated: Secondary | ICD-10-CM | POA: Diagnosis present

## 2018-03-29 DIAGNOSIS — F17200 Nicotine dependence, unspecified, uncomplicated: Secondary | ICD-10-CM | POA: Diagnosis present

## 2018-03-29 DIAGNOSIS — Z811 Family history of alcohol abuse and dependence: Secondary | ICD-10-CM

## 2018-03-29 DIAGNOSIS — M6282 Rhabdomyolysis: Secondary | ICD-10-CM | POA: Diagnosis present

## 2018-03-29 DIAGNOSIS — Z833 Family history of diabetes mellitus: Secondary | ICD-10-CM

## 2018-03-29 DIAGNOSIS — G43A0 Cyclical vomiting, not intractable: Secondary | ICD-10-CM | POA: Diagnosis present

## 2018-03-29 DIAGNOSIS — E860 Dehydration: Secondary | ICD-10-CM | POA: Diagnosis present

## 2018-03-29 DIAGNOSIS — E872 Acidosis: Secondary | ICD-10-CM | POA: Diagnosis present

## 2018-03-29 DIAGNOSIS — Z803 Family history of malignant neoplasm of breast: Secondary | ICD-10-CM

## 2018-03-29 DIAGNOSIS — D573 Sickle-cell trait: Secondary | ICD-10-CM | POA: Diagnosis present

## 2018-03-29 DIAGNOSIS — F319 Bipolar disorder, unspecified: Secondary | ICD-10-CM | POA: Diagnosis present

## 2018-03-29 DIAGNOSIS — Z590 Homelessness: Secondary | ICD-10-CM

## 2018-03-29 DIAGNOSIS — F17210 Nicotine dependence, cigarettes, uncomplicated: Secondary | ICD-10-CM | POA: Diagnosis present

## 2018-03-29 LAB — CBC
HCT: 54.9 % — ABNORMAL HIGH (ref 39.0–52.0)
Hemoglobin: 20 g/dL — ABNORMAL HIGH (ref 13.0–17.0)
MCH: 30.4 pg (ref 26.0–34.0)
MCHC: 36.4 g/dL — AB (ref 30.0–36.0)
MCV: 83.4 fL (ref 78.0–100.0)
PLATELETS: 307 10*3/uL (ref 150–400)
RBC: 6.58 MIL/uL — ABNORMAL HIGH (ref 4.22–5.81)
RDW: 13.9 % (ref 11.5–15.5)
WBC: 8.6 10*3/uL (ref 4.0–10.5)

## 2018-03-29 LAB — COMPREHENSIVE METABOLIC PANEL
ALK PHOS: 79 U/L (ref 38–126)
ALT: 26 U/L (ref 17–63)
AST: 22 U/L (ref 15–41)
Albumin: 6.2 g/dL — ABNORMAL HIGH (ref 3.5–5.0)
Anion gap: 21 — ABNORMAL HIGH (ref 5–15)
BUN: 39 mg/dL — AB (ref 6–20)
CALCIUM: 10.9 mg/dL — AB (ref 8.9–10.3)
CHLORIDE: 97 mmol/L — AB (ref 101–111)
CO2: 21 mmol/L — AB (ref 22–32)
CREATININE: 3.47 mg/dL — AB (ref 0.61–1.24)
GFR calc non Af Amer: 20 mL/min — ABNORMAL LOW (ref >60)
GFR, EST AFRICAN AMERICAN: 23 mL/min — AB (ref >60)
GLUCOSE: 161 mg/dL — AB (ref 65–99)
Potassium: 4.8 mmol/L (ref 3.5–5.1)
Sodium: 139 mmol/L (ref 135–145)
Total Bilirubin: 0.8 mg/dL (ref 0.3–1.2)
Total Protein: 10.8 g/dL — ABNORMAL HIGH (ref 6.5–8.1)

## 2018-03-29 LAB — LIPASE, BLOOD: Lipase: 42 U/L (ref 11–51)

## 2018-03-29 MED ORDER — ONDANSETRON 4 MG PO TBDP
4.0000 mg | ORAL_TABLET | Freq: Once | ORAL | Status: AC | PRN
  Administered 2018-03-29: 4 mg via ORAL
  Filled 2018-03-29 (×2): qty 1

## 2018-03-29 NOTE — ED Triage Notes (Signed)
PER [facility] pt from home for lower back pains that started today and muscles spasms that been on going for couple weeks. Pt was here couple weeks ago due to kidney issue and having dark urine still. Also c/o nausea.

## 2018-03-30 ENCOUNTER — Other Ambulatory Visit: Payer: Self-pay

## 2018-03-30 DIAGNOSIS — N179 Acute kidney failure, unspecified: Principal | ICD-10-CM

## 2018-03-30 LAB — URINALYSIS, ROUTINE W REFLEX MICROSCOPIC
Bilirubin Urine: NEGATIVE
GLUCOSE, UA: NEGATIVE mg/dL
Ketones, ur: NEGATIVE mg/dL
Nitrite: NEGATIVE
Protein, ur: 100 mg/dL — AB
RBC / HPF: >50 RBC/hpf — ABNORMAL HIGH (ref 0–5)
SPECIFIC GRAVITY, URINE: 1.015 (ref 1.005–1.030)
pH: 5 (ref 5.0–8.0)

## 2018-03-30 LAB — BASIC METABOLIC PANEL
ANION GAP: 10 (ref 5–15)
BUN: 40 mg/dL — ABNORMAL HIGH (ref 6–20)
CALCIUM: 8.6 mg/dL — AB (ref 8.9–10.3)
CHLORIDE: 106 mmol/L (ref 101–111)
CO2: 26 mmol/L (ref 22–32)
Creatinine, Ser: 1.63 mg/dL — ABNORMAL HIGH (ref 0.61–1.24)
GFR calc Af Amer: 56 mL/min — ABNORMAL LOW (ref >60)
GFR calc non Af Amer: 49 mL/min — ABNORMAL LOW (ref >60)
Glucose, Bld: 99 mg/dL (ref 65–99)
Potassium: 4.7 mmol/L (ref 3.5–5.1)
SODIUM: 142 mmol/L (ref 135–145)

## 2018-03-30 LAB — RAPID URINE DRUG SCREEN, HOSP PERFORMED
Amphetamines: NOT DETECTED
BARBITURATES: NOT DETECTED
Benzodiazepines: NOT DETECTED
COCAINE: NOT DETECTED
OPIATES: NOT DETECTED
Tetrahydrocannabinol: POSITIVE — AB

## 2018-03-30 LAB — CK
Total CK: 92 U/L (ref 49–397)
Total CK: 147 U/L (ref 49–397)

## 2018-03-30 LAB — MAGNESIUM: Magnesium: 3.6 mg/dL — ABNORMAL HIGH (ref 1.7–2.4)

## 2018-03-30 LAB — PHOSPHORUS: Phosphorus: 5.3 mg/dL — ABNORMAL HIGH (ref 2.5–4.6)

## 2018-03-30 LAB — SODIUM, URINE, RANDOM: SODIUM UR: 29 mmol/L

## 2018-03-30 LAB — CREATININE, URINE, RANDOM: CREATININE, URINE: 197.87 mg/dL

## 2018-03-30 MED ORDER — ENOXAPARIN SODIUM 30 MG/0.3ML ~~LOC~~ SOLN
30.0000 mg | SUBCUTANEOUS | Status: DC
  Administered 2018-03-30: 30 mg via SUBCUTANEOUS
  Filled 2018-03-30 (×2): qty 0.3

## 2018-03-30 MED ORDER — SODIUM CHLORIDE 0.9 % IV BOLUS
1000.0000 mL | Freq: Once | INTRAVENOUS | Status: AC
  Administered 2018-03-30: 1000 mL via INTRAVENOUS

## 2018-03-30 MED ORDER — SODIUM CHLORIDE 0.9 % IV SOLN
INTRAVENOUS | Status: DC
  Administered 2018-03-30 – 2018-03-31 (×4): via INTRAVENOUS

## 2018-03-30 MED ORDER — ENOXAPARIN SODIUM 40 MG/0.4ML ~~LOC~~ SOLN: 40.0000 mg | SUBCUTANEOUS | Status: DC

## 2018-03-30 MED ORDER — ONDANSETRON 4 MG PO TBDP
4.0000 mg | ORAL_TABLET | Freq: Once | ORAL | Status: AC | PRN
  Administered 2018-03-30: 4 mg via ORAL
  Filled 2018-03-30: qty 1

## 2018-03-30 MED ORDER — OXYCODONE HCL 5 MG PO TABS
5.0000 mg | ORAL_TABLET | ORAL | Status: DC | PRN
  Administered 2018-03-30 – 2018-03-31 (×4): 5 mg via ORAL
  Filled 2018-03-30 (×4): qty 1

## 2018-03-30 MED ORDER — FENTANYL CITRATE (PF) 100 MCG/2ML IJ SOLN
50.0000 ug | INTRAMUSCULAR | Status: DC | PRN
  Administered 2018-03-30 (×2): 50 ug via INTRAVENOUS
  Filled 2018-03-30 (×2): qty 2

## 2018-03-30 NOTE — ED Provider Notes (Signed)
[facility] Provider Note   CSN: [id number] Arrival date & time: 03/29/18  1756     History   Chief Complaint Chief Complaint  Patient presents with  . Back Pain  . Spasms  . dark urine    HPI Henry Kramer is a 47 y.o. male.  HPI  47 year old male with history of marijuana abuse, rhabdomyolysis comes in with chief complaint of back pain and spasms along with dark urine.  Patient states that he has had decreased urination with increased left lower quadrant and left back pain over the past 2 weeks or so.  Patient was admitted to the hospital for similar symptoms when he states that he was in renal failure.  Patient denies any associated leg weakness. Patient denies any cocaine use.   Past Medical History:  Diagnosis Date  . AKI (acute kidney injury) ([place]) 05/22/2014  . Chronic nausea   . GERD (gastroesophageal reflux disease)   . Homelessness   . Marijuana use   . Substance abuse ([place])    tobacco, marijuana, EtOH    Patient Active Problem List   Diagnosis Date Noted  . Elevated troponin 03/08/2018  . Acute renal failure (ARF) ([place]) 03/08/2018  . Depression 03/08/2018  . Dehydration 03/08/2018  . ARF (acute renal failure) ([place]) 09/29/2017  . Metabolic acidosis 03/00/9233  . Marijuana abuse 09/29/2017  . Rhabdomyolysis 09/29/2017  . Hyperkalemia 09/29/2017  . Benign prostatic hyperplasia with nocturia 09/06/2016  . Healthcare maintenance 09/06/2016  . Hematuria 07/30/2016  . Abnormal CT of liver 07/30/2016  . Protein-calorie malnutrition, severe 10/29/2015  . AKI (acute kidney injury) ([place]) 05/22/2014  . Chronic nausea 04/21/2014  . Smoking 04/21/2014  . History of substance abuse 04/21/2014    Past Surgical History:  Procedure Laterality Date  . NO PAST SURGERIES          Home Medications    Prior to Admission medications   Medication Sig Start Date End Date Taking? Authorizing Provider  feeding  supplement, ENSURE ENLIVE, (ENSURE ENLIVE) LIQD Take 237 mLs 2 (two) times daily between meals by mouth. Patient not taking: Reported on 03/08/2018 10/01/17   Barton Dubois, MD  Multiple Vitamin (MULTIVITAMIN WITH MINERALS) TABS tablet Take 1 tablet daily by mouth. Patient not taking: Reported on 03/08/2018 10/02/17   Barton Dubois, MD  pantoprazole (PROTONIX) 40 MG tablet Take 1 tablet (40 mg total) daily by mouth. Patient not taking: Reported on 03/30/2018 10/01/17 10/01/18  Barton Dubois, MD    Family History Family History  Problem Relation Age of Onset  . Cancer Mother   . Alcohol abuse Father   . Cirrhosis Father   . Cancer Sister   . Diabetes Brother   . Obesity Brother     Social History Social History   Tobacco Use  . Smoking status: Current Every Day Smoker    Packs/day: 0.50    Years: 22.00    Pack years: 11.00    Types: Cigarettes  . Smokeless tobacco: Never Used  Substance Use Topics  . Alcohol use: Yes    Comment: "occasional".    . Drug use: Yes    Types: Marijuana    Comment: smokes THC 2-3 times per week.     Allergies   Patient has no known allergies.   Review of Systems Review of Systems  Constitutional: Positive for activity change.  Gastrointestinal: Positive for nausea and vomiting.  Musculoskeletal: Positive for back pain.  Allergic/Immunologic: Negative for immunocompromised state.  All other systems reviewed and are negative.    Physical Exam Updated Vital Signs BP 116/76 (BP Location: Right Arm)   Pulse 62   Temp 98.1 F (36.7 C) (Oral)   Resp 16   Ht 6' (1.829 m)   Wt 64.4 kg (142 lb)   SpO2 99%   BMI 19.26 kg/m   Physical Exam  Constitutional: He is oriented to person, place, and time. He appears well-developed.  HENT:  Head: Atraumatic.  Neck: Neck supple.  Cardiovascular: Normal rate and intact distal pulses.  Pulmonary/Chest: Effort normal.  Abdominal: Soft.  Musculoskeletal:  Reproducible tenderness in the left  lower quadrant in the left back  Neurological: He is alert and oriented to person, place, and time.  Skin: Skin is warm.  Nursing note and vitals reviewed.    ED Treatments / Results  Labs (all labs ordered are listed, but only abnormal results are displayed) Labs Reviewed  COMPREHENSIVE METABOLIC PANEL - Abnormal; Notable for the following components:      Result Value   Chloride 97 (*)    CO2 21 (*)    Glucose, Bld 161 (*)    BUN 39 (*)    Creatinine, Ser 3.47 (*)    Calcium 10.9 (*)    Total Protein 10.8 (*)    Albumin 6.2 (*)    GFR calc non Af Amer 20 (*)    GFR calc Af Amer 23 (*)    Anion gap 21 (*)    All other components within normal limits  CBC - Abnormal; Notable for the following components:   RBC 6.58 (*)    Hemoglobin 20.0 (*)    HCT 54.9 (*)    MCHC 36.4 (*)    All other components within normal limits  URINALYSIS, ROUTINE W REFLEX MICROSCOPIC - Abnormal; Notable for the following components:   APPearance HAZY (*)    Hgb urine dipstick MODERATE (*)    Protein, ur 100 (*)    Leukocytes, UA TRACE (*)    RBC / HPF >50 (*)    Bacteria, UA RARE (*)    All other components within normal limits  MAGNESIUM - Abnormal; Notable for the following components:   Magnesium 3.6 (*)    All other components within normal limits  PHOSPHORUS - Abnormal; Notable for the following components:   Phosphorus 5.3 (*)    All other components within normal limits  RAPID URINE DRUG SCREEN, HOSP PERFORMED - Abnormal; Notable for the following components:   Tetrahydrocannabinol POSITIVE (*)    All other components within normal limits  LIPASE, BLOOD  CK  SODIUM, URINE, RANDOM  CREATININE, URINE, RANDOM  COMPREHENSIVE METABOLIC PANEL  CBC    EKG None  Radiology No results found.  Procedures Procedures (including critical care time)  Medications Ordered in ED Medications  enoxaparin (LOVENOX) injection 30 mg (30 mg Subcutaneous Given 03/30/18 1604)  0.9 %  sodium  chloride infusion ( Intravenous New Bag/Given 03/30/18 1558)  oxyCODONE (Oxy IR/ROXICODONE) immediate release tablet 5 mg (5 mg Oral Given 03/30/18 1604)  ondansetron (ZOFRAN-ODT) disintegrating tablet 4 mg (4 mg Oral Given 03/29/18 2357)  sodium chloride 0.9 % bolus 1,000 mL (0 mLs Intravenous Stopped 03/30/18 0436)  sodium chloride 0.9 % bolus 1,000 mL (0 mLs Intravenous Stopped 03/30/18 0305)  sodium chloride 0.9 % bolus 1,000 mL (0 mLs Intravenous Stopped 03/30/18 0327)  ondansetron (ZOFRAN-ODT) disintegrating tablet 4 mg (4 mg Oral Given 03/30/18 1121)     Initial Impression /  Assessment and Plan / ED Course  I have reviewed the triage vital signs and the nursing notes.  Pertinent labs & imaging results that were available during my care of the patient were reviewed by me and considered in my medical decision making (see chart for details).     47 year old male comes in with chief complaint of back pain.  Patient was recently admitted for rhabdomyolysis leading to acute renal failure.  Patient is also having nausea and vomiting.  History is positive for marijuana use but not any amphetamine or cocaine use.  Differential diagnosis included dissection that has led to the renal failure today, so 2 separate pulse exam were done, and both were normal and equal.  Additionally patient's blood pressure and cardiac exam are also within normal limits.  I doubt that patient is having dissection at this time.  CK has been ordered -it is normal this time around.  Roney Mans has been ordered to further evaluate patient's etiology for the renal failure which appears to be intrinsic kidney and prerenal in nature. Ultrasound renal from the last visit has also been reviewed.  Patient will need admission.  3 L of IV fluid ordered.  Final Clinical Impressions(s) / ED Diagnoses   Final diagnoses:  AKI (acute kidney injury) ([place])  Severe dehydration    ED Discharge Orders    None       Varney Biles, MD 03/30/18  1720

## 2018-03-30 NOTE — Progress Notes (Signed)
Tylene Fantasia NP notified that patients stat lab results have resulted.

## 2018-03-30 NOTE — H&P (Addendum)
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  Review of Systems:  No chest pain, no fever, no chills, no Reiger, no rash, no cold except as above  Past Medical History:  Diagnosis Date  . AKI (acute kidney injury) ([place]) 05/22/2014  . Chronic nausea   . GERD (gastroesophageal reflux disease)   . Homelessness   . Marijuana use   . Substance abuse ([place])    tobacco, marijuana, EtOH    Past Surgical History:  Procedure Laterality Date  . NO PAST SURGERIES       reports that he has been smoking cigarettes.  He has a 11.00 pack-year smoking history. He has never used smokeless tobacco. He reports that he drinks alcohol. He reports that he has current or past drug history. Drug: Marijuana. Mobility: Independent at baseline very active gentleman Father died of chronic ethanolism at age 15-mother has breast cancer  No Known Allergies  Family History  Problem Relation Age of Onset  . Cancer Mother   . Alcohol abuse Father   . Cirrhosis Father   . Cancer Sister   . Diabetes Brother   . Obesity Brother      Prior to Admission medications   Medication Sig Start Date End Date  Taking? Authorizing Provider  feeding supplement, ENSURE ENLIVE, (ENSURE ENLIVE) LIQD Take 237 mLs 2 (two) times daily between meals by mouth. Patient not taking: Reported on 03/08/2018 10/01/17   Barton Dubois, MD  Multiple Vitamin (MULTIVITAMIN WITH MINERALS) TABS tablet Take 1 tablet daily by mouth. Patient not taking: Reported on 03/08/2018 10/02/17   Barton Dubois, MD  pantoprazole (PROTONIX) 40 MG tablet Take 1 tablet (40 mg total) daily by mouth. Patient not taking: Reported on 03/30/2018 10/01/17 10/01/18  Barton Dubois, MD    Physical Exam:  Vitals:   03/30/18 1330 03/30/18 1331  BP: 105/77 105/77  Pulse:  67  Resp:  14  Temp:    SpO2:  97%     Alert pleasant well built moderately nourished  No arcus senilis no JVD no bruit  Neck is soft supple no LAM  Chest is clinically clear no added sound  Slight tenderness epigastrium  Neurologically intact moves all 4 limbs equally power 5/5 sensory grossly intact no focal deficit smile is symmetric  R OM 2 major joints intact without deficit  Skin also intact with no lower extremity edema  I have personally reviewed following labs and imaging studies  Labs:   Bicarb 21 creatinine 3.4-his baseline looks like it is in the 2 range on 4/22 was 5.7 on 4/24 was 3.9-total protein is deranged at 10.8  Urine sodium is  29 urine creatinine 197  Urine analysis showed moderate hemoglobin trace leukocytes specific gravity 1.015 rare bacteria  Imaging studies:   None performed  Medical tests:   EKG independently reviewed: None  Test discussed with performing physician:  No  Decision to obtain old records:   No  Review and summation of old records:   Reviewed and documented  Active Problems:   AKI (acute kidney injury) ([place])   Assessment/Plan Rhabdomyolysis-mild   AKI--? SUbstance related? Phosphorus is slightly high at 5.3 BUN/creatinine on 4/24 was 27/0.9 we will hydrate copiously he has a metabolic acidosis  likely secondary to the AKA I do not feel strongly about bicarb addition to his fluids at this time in addition he has an anion gap acidosis again likely secondary to acidosis from renal origin  We will replete if needed other electrolytes his CK currently is surprisingly only 92  I will obtain a urinary drug screen as I have high suspicion for illicit substance use If we do not find a source for his AKI I suppose he may need a renal ultrasound going forward though he had one 03/08/2018 which did not show anything He does not need more pain medications above oxycodone and I would use sparingly that occasions like Toradol  Prior history of 2 small benign hepatic hemangiomas 07/2016-outpatient follow-up  Former smoker-see above  Bipolar-not taking Lexapro-  prior homelessness state   Severity of Illness: The appropriate patient status for this patient is INPATIENT. Inpatient status is judged to be reasonable and necessary in order to provide the required intensity of service to ensure the patient's safety. The patient's presenting symptoms, physical exam findings, and initial radiographic and laboratory data in the context of their chronic comorbidities is felt to place them at high risk for further clinical deterioration. Furthermore, it is not anticipated that the patient will be medically stable for discharge from the hospital within 2 midnights of admission. The following factors support the patient status of inpatient.   " The patient's presenting symptoms include cramps myalgias. " The worrisome physical exam findings include volume depletion. " The initial radiographic and laboratory data are worrisome because of AKI. " The chronic co-morbidities include none.   * I certify that at the point of admission it is my clinical judgment that the patient will require inpatient hospital care spanning beyond 2 midnights from the point of admission due to high intensity of service, high risk for  further deterioration and high frequency of surveillance required.*     DVT prophylaxis: Lovenox Code Status: Full Family Communication: None Consults called: None yet  Time spent: 10 minutes  Arjun Hard, MD  [facility] Direct contact: [phone] --[place]  --[email or web address]; password [email or web address]  7PM-7AM contact night coverage as above  03/30/2018, 1:37 PM

## 2018-03-31 DIAGNOSIS — F1210 Cannabis abuse, uncomplicated: Secondary | ICD-10-CM

## 2018-03-31 DIAGNOSIS — R110 Nausea: Secondary | ICD-10-CM

## 2018-03-31 DIAGNOSIS — F17200 Nicotine dependence, unspecified, uncomplicated: Secondary | ICD-10-CM

## 2018-03-31 DIAGNOSIS — E860 Dehydration: Secondary | ICD-10-CM

## 2018-03-31 LAB — COMPREHENSIVE METABOLIC PANEL
ALBUMIN: 3.5 g/dL (ref 3.5–5.0)
ALK PHOS: 47 U/L (ref 38–126)
ALT: 15 U/L — ABNORMAL LOW (ref 17–63)
AST: 14 U/L — AB (ref 15–41)
Anion gap: 7 (ref 5–15)
BILIRUBIN TOTAL: 0.6 mg/dL (ref 0.3–1.2)
BUN: 28 mg/dL — AB (ref 6–20)
CALCIUM: 8.5 mg/dL — AB (ref 8.9–10.3)
CO2: 24 mmol/L (ref 22–32)
CREATININE: 1.03 mg/dL (ref 0.61–1.24)
Chloride: 109 mmol/L (ref 101–111)
GFR calc Af Amer: >60 mL/min (ref >60)
GFR calc non Af Amer: >60 mL/min (ref >60)
GLUCOSE: 101 mg/dL — AB (ref 65–99)
Potassium: 4.8 mmol/L (ref 3.5–5.1)
Sodium: 140 mmol/L (ref 135–145)
TOTAL PROTEIN: 6 g/dL — AB (ref 6.5–8.1)

## 2018-03-31 LAB — CBC
HEMATOCRIT: 38.2 % — AB (ref 39.0–52.0)
HEMOGLOBIN: 13.2 g/dL (ref 13.0–17.0)
MCH: 29.3 pg (ref 26.0–34.0)
MCHC: 34.6 g/dL (ref 30.0–36.0)
MCV: 84.9 fL (ref 78.0–100.0)
Platelets: 181 10*3/uL (ref 150–400)
RBC: 4.5 MIL/uL (ref 4.22–5.81)
RDW: 13.9 % (ref 11.5–15.5)
WBC: 4.2 10*3/uL (ref 4.0–10.5)

## 2018-03-31 MED ORDER — ACETAMINOPHEN 325 MG PO TABS: 650.0000 mg | ORAL_TABLET | Freq: Four times a day (QID) | ORAL | Status: DC | PRN

## 2018-03-31 MED ORDER — ONDANSETRON HCL 4 MG/2ML IJ SOLN
4.0000 mg | Freq: Four times a day (QID) | INTRAMUSCULAR | Status: DC | PRN
  Administered 2018-03-31: 4 mg via INTRAVENOUS
  Filled 2018-03-31: qty 2

## 2018-03-31 NOTE — Progress Notes (Addendum)
Unable to schedule appointment at [facility] or [facility]. Pt will need to call for an appointment on 5/20 to [facility], pt is aware. Pt states that he is able to get his medications from the  [facility].

## 2018-03-31 NOTE — Discharge Summary (Signed)
Physician Discharge Summary  MISTER KRAHENBUHL XAJ:[id number] DOB: 1971/05/31 DOA: 03/29/2018  PCP: No primary care provider on file. follows with [facility]  Admit date: 03/29/2018 Discharge date: 03/31/2018  Admitted From: home  Disposition:  home      Discharge Condition: Stable CODE STATUS: Full code  Consultations:  None   Discharge Diagnoses:  Principal Problem:   AKI (acute kidney injury) ([place]) Active Problems:   Chronic nausea   Smoking   Marijuana abuse   Dehydration   Brief Summary: This is a 47 year old male with sickle cell traitChronic issues with nausea vomiting, history of marijuana use and a cigarette smoker who presents to the hospital for muscle cramping starting in his legs and going up to his stomach and causing nausea and vomiting.  He is admitted for AKI.  He has had a number of admissions in the past for nausea vomiting and abdominal pain and is usually found to have acute renal failure and mild rhabdomyolysis which improved with IV fluids. Per HPI and from what the patient told me, he has been riding his bike wherever he needs to go as he does not have a car and often does not drink enough fluids.    He is a poor historian and tends to ramble on. states that [facility] will not care for him because they look at him like he is homeless.  Records in epic revealed that he was previously being [facility] by the [facility] who was trying to set up appointments for GI follow-up but he continued to miss these.  He continues to use marijuana and smokes cigarettes.  Hospital Course:  AKI  - Creatinine 3.7 on admission--this has improved to 1.03 today with IV fluids -Renal ultrasound unrevealing when last checked on 4/22-it was not repeated - he is eating and drinking well in the hospital without any vomiting-   ?  Chronic nausea -There is a suspicion that he may have cyclical vomiting due to chronic marijuana use-he has been prescribed Phenergan in the past by the [facility] who  actually gives him his medications as he is unable to afford them -he states that he is also been on "acid suppression medication" as well also given by the [facility] -Unfortunately, no appointments available for [place] and wellness clinic but case management has asked him to call himself to see if any future appointments can be made for him  -recommended to follow-up with [facility] to get more antiemetics and further work-up-  Marijuana and cigarette abuse - Advised to discontinue   Discharge Exam: Vitals:   03/30/18 2216 03/31/18 0435  BP: 121/73 106/74  Pulse: 68 (!) 54  Resp: 17 17  Temp: 99.1 F (37.3 C) 99 F (37.2 C)  SpO2: 97% 98%   Vitals:   03/30/18 1551 03/30/18 1621 03/30/18 2216 03/31/18 0435  BP: 116/76  121/73 106/74  Pulse: 62  68 (!) 54  Resp: 16  17 17   Temp: 98.1 F (36.7 C)  99.1 F (37.3 C) 99 F (37.2 C)  TempSrc: Oral  Oral Oral  SpO2: 99%  97% 98%  Weight:  64.4 kg (142 lb)    Height:  6' (1.829 m)      General: Pt is alert, awake, not in acute distress Cardiovascular: RRR, S1/S2 +, no rubs, no gallops Respiratory: CTA bilaterally, no wheezing, no rhonchi Abdominal: Soft, NT, ND, bowel sounds + Extremities: no edema, no cyanosis   Discharge Instructions  Discharge Instructions    Diet - low  sodium heart healthy   Complete by:  As directed    Increase activity slowly   Complete by:  As directed      Allergies as of 03/31/2018   No Known Allergies     Medication List    TAKE these medications   acetaminophen 325 MG tablet Commonly known as:  TYLENOL Take 2 tablets (650 mg total) by mouth every 6 (six) hours as needed for mild pain or headache.      Follow-up [facility] up.   Why:  Call on Monday 5/20 for Hospital follow up. [phone] Contact information: [address] [place] [address] [phone]         No Known  Allergies   Procedures/Studies:  US Renal  Result Date: 03/20/18 CLINICAL DATA:  Acute renal failure EXAM: RENAL / URINARY TRACT ULTRASOUND COMPLETE COMPARISON:  09/29/2017 FINDINGS: Right Kidney: Length: 10.6 cm.  Echogenic cortex.  No hydronephrosis Left Kidney: Length: 10.2 cm.  Echogenic cortex.  No hydronephrosis Bladder: Appears normal for degree of bladder distention. IMPRESSION: 1. Negative for hydronephrosis 2. Echogenic kidneys bilaterally consistent with medical renal disease Electronically Signed   By: Donavan Foil M.D.   On: 03-20-18 20:50     The results of significant diagnostics from this hospitalization (including imaging, microbiology, ancillary and laboratory) are listed below for reference.     Microbiology: No results found for this or any previous visit (from the past 240 hour(s)).   Labs: BNP (last 3 results) No results for input(s): BNP in the last 8760 hours. Basic Metabolic Panel: Recent Labs  Lab 03/29/18 1823 03/30/18 0141 03/30/18 1803 03/31/18 0355 03/31/18 0640  NA 139  --  142 QUESTIONABLE RESULTS, RECOMMEND RECOLLECT TO VERIFY 140  K 4.8  --  4.7 QUESTIONABLE RESULTS, RECOMMEND RECOLLECT TO VERIFY 4.8  CL 97*  --  106 QUESTIONABLE RESULTS, RECOMMEND RECOLLECT TO VERIFY 109  CO2 21*  --  26 QUESTIONABLE RESULTS, RECOMMEND RECOLLECT TO VERIFY 24  GLUCOSE 161*  --  99 QUESTIONABLE RESULTS, RECOMMEND RECOLLECT TO VERIFY 101*  BUN 39*  --  40* QUESTIONABLE RESULTS, RECOMMEND RECOLLECT TO VERIFY 28*  CREATININE 3.47*  --  1.63* QUESTIONABLE RESULTS, RECOMMEND RECOLLECT TO VERIFY 1.03  CALCIUM 10.9*  --  8.6* QUESTIONABLE RESULTS, RECOMMEND RECOLLECT TO VERIFY 8.5*  MG  --  3.6*  --   --   --   PHOS  --  5.3*  --   --   --    Liver Function Tests: Recent Labs  Lab 03/29/18 1823 03/31/18 0355 03/31/18 0640  AST 22 QUESTIONABLE RESULTS, RECOMMEND RECOLLECT TO VERIFY 14*  ALT 26 QUESTIONABLE RESULTS, RECOMMEND RECOLLECT TO VERIFY 15*  ALKPHOS  79 QUESTIONABLE RESULTS, RECOMMEND RECOLLECT TO VERIFY 47  BILITOT 0.8 QUESTIONABLE RESULTS, RECOMMEND RECOLLECT TO VERIFY 0.6  PROT 10.8* QUESTIONABLE RESULTS, RECOMMEND RECOLLECT TO VERIFY 6.0*  ALBUMIN 6.2* QUESTIONABLE RESULTS, RECOMMEND RECOLLECT TO VERIFY 3.5   Recent Labs  Lab 03/29/18 1823  LIPASE 42   No results for input(s): AMMONIA in the last 168 hours. CBC: Recent Labs  Lab 03/29/18 1823 03/31/18 0640  WBC 8.6 4.2  HGB 20.0* 13.2  HCT 54.9* 38.2*  MCV 83.4 84.9  PLT 307 181   Cardiac Enzymes: Recent Labs  Lab 03/30/18 0141 03/30/18 1803  CKTOTAL 92 147   BNP: Invalid input(s): POCBNP CBG: No results for input(s): GLUCAP in the last 168 hours. D-Dimer No results  for input(s): DDIMER in the last 72 hours. Hgb A1c No results for input(s): HGBA1C in the last 72 hours. Lipid Profile No results for input(s): CHOL, HDL, LDLCALC, TRIG, CHOLHDL, LDLDIRECT in the last 72 hours. Thyroid function studies No results for input(s): TSH, T4TOTAL, T3FREE, THYROIDAB in the last 72 hours.  Invalid input(s): FREET3 Anemia work up No results for input(s): VITAMINB12, FOLATE, FERRITIN, TIBC, IRON, RETICCTPCT in the last 72 hours. Urinalysis    Component Value Date/Time   COLORURINE YELLOW 03/30/2018 0536   APPEARANCEUR HAZY (A) 03/30/2018 0536   APPEARANCEUR Clear 07/30/2016 1522   LABSPEC 1.015 03/30/2018 0536   PHURINE 5.0 03/30/2018 0536   GLUCOSEU NEGATIVE 03/30/2018 0536   HGBUR MODERATE (A) 03/30/2018 0536   BILIRUBINUR NEGATIVE 03/30/2018 0536   BILIRUBINUR Negative 07/30/2016 1522   KETONESUR NEGATIVE 03/30/2018 0536   PROTEINUR 100 (A) 03/30/2018 0536   UROBILINOGEN 0.2 01/15/2015 1821   NITRITE NEGATIVE 03/30/2018 0536   LEUKOCYTESUR TRACE (A) 03/30/2018 0536   LEUKOCYTESUR Negative 07/30/2016 1522   Sepsis Labs Invalid input(s): PROCALCITONIN,  WBC,  LACTICIDVEN Microbiology No results found for this or any previous visit (from the past 240  hour(s)).   Time coordinating discharge in minutes: 35  SIGNED:   Debbe Odea, MD  [facility] 03/31/2018, 11:35 AM Pager   If 7PM-7AM, please contact night-coverage [email or web address] Password [email or web address]

## 2018-03-31 NOTE — Discharge Instructions (Signed)
Please stop using marijuana and try to stop smoking cigarettes. Follow-up with the [facility] to obtain further medicine for your nausea Try to drink 1 to 2 L of water a day  You were cared for by a hospitalist during your hospital stay. If you have any questions about your discharge medications or the care you received while you were in the hospital after you are discharged, you can call the unit and asked to speak with the hospitalist on call if the hospitalist that took care of you is not available. Once you are discharged, your primary care physician will handle any further medical issues. Please note that NO REFILLS for any discharge medications will be authorized once you are discharged, as it is imperative that you return to your primary care physician (or establish a relationship with a primary care physician if you do not have one) for your aftercare needs so that they can reassess your need for medications and monitor your lab values.  Please take all your medications with you for your next visit with your Primary MD. Please ask your Primary MD to get all Hospital records sent to his/her office. Please request your Primary MD to go over all hospital test results at the follow up.    If you experience worsening of your admission symptoms, develop shortness of breath, chest pain, suicidal or homicidal thoughts or a life threatening emergency, you must seek medical attention immediately by calling 911 or calling your MD.   Dennis Bast must read the complete instructions/literature along with all the possible adverse reactions/side effects for all the medicines you take including new medications that have been prescribed to you. Take new medicines after you have completely understood and accpet all the possible adverse reactions/side effects.    Do not drive when taking pain medications or sedatives.     Do not take more than prescribed Pain, Sleep and Anxiety Medications   If you have smoked or chewed  Tobacco in the last 2 yrs please stop. Stop any regular alcohol  and or recreational drug use.   Wear Seat belts while driving.

## 2019-12-02 ENCOUNTER — Other Ambulatory Visit: Payer: Self-pay | Admitting: *Deleted

## 2019-12-02 DIAGNOSIS — Z20822 Contact with and (suspected) exposure to covid-19: Secondary | ICD-10-CM

## 2019-12-03 LAB — NOVEL CORONAVIRUS, NAA: SARS-CoV-2, NAA: NOT DETECTED

## 2019-12-23 ENCOUNTER — Other Ambulatory Visit: Payer: Self-pay

## 2019-12-23 DIAGNOSIS — Z20822 Contact with and (suspected) exposure to covid-19: Secondary | ICD-10-CM

## 2019-12-25 LAB — NOVEL CORONAVIRUS, NAA: SARS-CoV-2, NAA: NOT DETECTED

## 2020-01-13 ENCOUNTER — Other Ambulatory Visit: Payer: Self-pay | Admitting: *Deleted

## 2020-01-13 DIAGNOSIS — Z20822 Contact with and (suspected) exposure to covid-19: Secondary | ICD-10-CM

## 2020-01-14 LAB — NOVEL CORONAVIRUS, NAA: SARS-CoV-2, NAA: NOT DETECTED

## 2023-11-22 ENCOUNTER — Inpatient Hospital Stay (HOSPITAL_COMMUNITY)
Admission: EM | Admit: 2023-11-22 | Discharge: 2023-11-30 | DRG: 380 | Disposition: A | Discharge status: Home / Self Care | Payer: Medicaid Other | Attending: Internal Medicine | Admitting: Internal Medicine

## 2023-11-22 ENCOUNTER — Other Ambulatory Visit: Payer: Self-pay

## 2023-11-22 ENCOUNTER — Emergency Department (HOSPITAL_COMMUNITY): Payer: Medicaid Other

## 2023-11-22 ENCOUNTER — Encounter (HOSPITAL_COMMUNITY): Payer: Self-pay | Admitting: Family Medicine

## 2023-11-22 DIAGNOSIS — J95811 Postprocedural pneumothorax: Secondary | ICD-10-CM | POA: Diagnosis not present

## 2023-11-22 DIAGNOSIS — B9681 Helicobacter pylori [H. pylori] as the cause of diseases classified elsewhere: Secondary | ICD-10-CM | POA: Diagnosis present

## 2023-11-22 DIAGNOSIS — Z5982 Transportation insecurity: Secondary | ICD-10-CM

## 2023-11-22 DIAGNOSIS — D72829 Elevated white blood cell count, unspecified: Secondary | ICD-10-CM | POA: Diagnosis present

## 2023-11-22 DIAGNOSIS — G8929 Other chronic pain: Secondary | ICD-10-CM | POA: Diagnosis present

## 2023-11-22 DIAGNOSIS — Z811 Family history of alcohol abuse and dependence: Secondary | ICD-10-CM

## 2023-11-22 DIAGNOSIS — Z716 Tobacco abuse counseling: Secondary | ICD-10-CM

## 2023-11-22 DIAGNOSIS — K219 Gastro-esophageal reflux disease without esophagitis: Secondary | ICD-10-CM | POA: Diagnosis present

## 2023-11-22 DIAGNOSIS — B957 Other staphylococcus as the cause of diseases classified elsewhere: Secondary | ICD-10-CM | POA: Diagnosis present

## 2023-11-22 DIAGNOSIS — K319 Disease of stomach and duodenum, unspecified: Secondary | ICD-10-CM | POA: Diagnosis present

## 2023-11-22 DIAGNOSIS — R634 Abnormal weight loss: Secondary | ICD-10-CM

## 2023-11-22 DIAGNOSIS — Z7952 Long term (current) use of systemic steroids: Secondary | ICD-10-CM

## 2023-11-22 DIAGNOSIS — E43 Unspecified severe protein-calorie malnutrition: Secondary | ICD-10-CM | POA: Diagnosis present

## 2023-11-22 DIAGNOSIS — B954 Other streptococcus as the cause of diseases classified elsewhere: Secondary | ICD-10-CM | POA: Diagnosis present

## 2023-11-22 DIAGNOSIS — Y838 Other surgical procedures as the cause of abnormal reaction of the patient, or of later complication, without mention of misadventure at the time of the procedure: Secondary | ICD-10-CM | POA: Diagnosis not present

## 2023-11-22 DIAGNOSIS — R64 Cachexia: Secondary | ICD-10-CM | POA: Diagnosis present

## 2023-11-22 DIAGNOSIS — Z5900 Homelessness unspecified: Secondary | ICD-10-CM

## 2023-11-22 DIAGNOSIS — Z79899 Other long term (current) drug therapy: Secondary | ICD-10-CM

## 2023-11-22 DIAGNOSIS — I272 Pulmonary hypertension, unspecified: Secondary | ICD-10-CM | POA: Diagnosis present

## 2023-11-22 DIAGNOSIS — Z681 Body mass index (BMI) 19 or less, adult: Secondary | ICD-10-CM

## 2023-11-22 DIAGNOSIS — Z23 Encounter for immunization: Secondary | ICD-10-CM

## 2023-11-22 DIAGNOSIS — R935 Abnormal findings on diagnostic imaging of other abdominal regions, including retroperitoneum: Secondary | ICD-10-CM

## 2023-11-22 DIAGNOSIS — F32A Depression, unspecified: Secondary | ICD-10-CM | POA: Diagnosis present

## 2023-11-22 DIAGNOSIS — D638 Anemia in other chronic diseases classified elsewhere: Secondary | ICD-10-CM | POA: Diagnosis present

## 2023-11-22 DIAGNOSIS — Z5941 Food insecurity: Secondary | ICD-10-CM

## 2023-11-22 DIAGNOSIS — J40 Bronchitis, not specified as acute or chronic: Secondary | ICD-10-CM

## 2023-11-22 DIAGNOSIS — I4510 Unspecified right bundle-branch block: Secondary | ICD-10-CM | POA: Diagnosis present

## 2023-11-22 DIAGNOSIS — F17210 Nicotine dependence, cigarettes, uncomplicated: Secondary | ICD-10-CM | POA: Diagnosis present

## 2023-11-22 DIAGNOSIS — I444 Left anterior fascicular block: Secondary | ICD-10-CM | POA: Diagnosis present

## 2023-11-22 DIAGNOSIS — K449 Diaphragmatic hernia without obstruction or gangrene: Secondary | ICD-10-CM | POA: Diagnosis present

## 2023-11-22 DIAGNOSIS — F1210 Cannabis abuse, uncomplicated: Secondary | ICD-10-CM | POA: Diagnosis present

## 2023-11-22 DIAGNOSIS — Z833 Family history of diabetes mellitus: Secondary | ICD-10-CM

## 2023-11-22 DIAGNOSIS — R933 Abnormal findings on diagnostic imaging of other parts of digestive tract: Secondary | ICD-10-CM

## 2023-11-22 DIAGNOSIS — K255 Chronic or unspecified gastric ulcer with perforation: Principal | ICD-10-CM

## 2023-11-22 DIAGNOSIS — D75839 Thrombocytosis, unspecified: Secondary | ICD-10-CM | POA: Diagnosis present

## 2023-11-22 LAB — CBC
HCT: 34.4 % — ABNORMAL LOW (ref 39.0–52.0)
Hemoglobin: 11.8 g/dL — ABNORMAL LOW (ref 13.0–17.0)
MCH: 26.8 pg (ref 26.0–34.0)
MCHC: 34.3 g/dL (ref 30.0–36.0)
MCV: 78.2 fL — ABNORMAL LOW (ref 80.0–100.0)
Platelets: 813 10*3/uL — ABNORMAL HIGH (ref 150–400)
RBC: 4.4 MIL/uL (ref 4.22–5.81)
RDW: 15.1 % (ref 11.5–15.5)
WBC: 18.9 10*3/uL — ABNORMAL HIGH (ref 4.0–10.5)
nRBC: 0 % (ref 0.0–0.2)

## 2023-11-22 LAB — URINALYSIS, ROUTINE W REFLEX MICROSCOPIC
Bilirubin Urine: NEGATIVE
Glucose, UA: NEGATIVE mg/dL
Hgb urine dipstick: NEGATIVE
Ketones, ur: NEGATIVE mg/dL
Leukocytes,Ua: NEGATIVE
Nitrite: NEGATIVE
Protein, ur: NEGATIVE mg/dL
Specific Gravity, Urine: 1.015 (ref 1.005–1.030)
pH: 5 (ref 5.0–8.0)

## 2023-11-22 LAB — COMPREHENSIVE METABOLIC PANEL
ALT: 91 U/L — ABNORMAL HIGH (ref 0–44)
AST: 54 U/L — ABNORMAL HIGH (ref 15–41)
Albumin: 2.4 g/dL — ABNORMAL LOW (ref 3.5–5.0)
Alkaline Phosphatase: 136 U/L — ABNORMAL HIGH (ref 38–126)
Anion gap: 12 (ref 5–15)
BUN: 25 mg/dL — ABNORMAL HIGH (ref 6–20)
CO2: 27 mmol/L (ref 22–32)
Calcium: 8.9 mg/dL (ref 8.9–10.3)
Chloride: 101 mmol/L (ref 98–111)
Creatinine, Ser: 0.96 mg/dL (ref 0.61–1.24)
GFR, Estimated: >60 mL/min (ref >60)
Glucose, Bld: 95 mg/dL (ref 70–99)
Potassium: 4.5 mmol/L (ref 3.5–5.1)
Sodium: 140 mmol/L (ref 135–145)
Total Bilirubin: 0.3 mg/dL (ref 0.0–1.2)
Total Protein: 7.5 g/dL (ref 6.5–8.1)

## 2023-11-22 LAB — TROPONIN I (HIGH SENSITIVITY): Troponin I (High Sensitivity): 12 ng/L (ref <18)

## 2023-11-22 LAB — LIPASE, BLOOD: Lipase: 47 U/L (ref 11–51)

## 2023-11-22 MED ORDER — AMOXICILLIN-POT CLAVULANATE 875-125 MG PO TABS: 1.0000 | ORAL_TABLET | Freq: Two times a day (BID) | ORAL | 0 refills | Status: DC

## 2023-11-22 MED ORDER — ALBUTEROL SULFATE HFA 108 (90 BASE) MCG/ACT IN AERS: 1.0000 | INHALATION_SPRAY | Freq: Four times a day (QID) | RESPIRATORY_TRACT | 0 refills | Status: DC | PRN

## 2023-11-22 MED ORDER — PANTOPRAZOLE SODIUM 40 MG IV SOLR
40.0000 mg | INTRAVENOUS | Status: AC
  Administered 2023-11-22 (×2): 40 mg via INTRAVENOUS
  Filled 2023-11-22 (×2): qty 10

## 2023-11-22 MED ORDER — ACETAMINOPHEN 650 MG RE SUPP: 650.0000 mg | Freq: Four times a day (QID) | RECTAL | Status: DC | PRN

## 2023-11-22 MED ORDER — ACETAMINOPHEN 325 MG PO TABS: 650.0000 mg | ORAL_TABLET | Freq: Four times a day (QID) | ORAL | Status: DC | PRN

## 2023-11-22 MED ORDER — ONDANSETRON HCL 4 MG PO TABS
4.0000 mg | ORAL_TABLET | Freq: Four times a day (QID) | ORAL | Status: DC | PRN
Start: 2023-11-22 — End: 2023-11-30

## 2023-11-22 MED ORDER — PANTOPRAZOLE SODIUM 40 MG IV SOLR: 40.0000 mg | Freq: Once | INTRAVENOUS | Status: DC

## 2023-11-22 MED ORDER — SODIUM CHLORIDE 0.9% FLUSH
3.0000 mL | Freq: Two times a day (BID) | INTRAVENOUS | Status: DC
  Administered 2023-11-23 – 2023-11-25 (×7): 3 mL via INTRAVENOUS
  Administered 2023-11-26: 20 mL via INTRAVENOUS
  Administered 2023-11-27: 3 mL via INTRAVENOUS
  Administered 2023-11-28: 10 mL via INTRAVENOUS
  Administered 2023-11-28: 3 mL via INTRAVENOUS
  Administered 2023-11-29: 10 mL via INTRAVENOUS
  Administered 2023-11-29: 3 mL via INTRAVENOUS
  Administered 2023-11-29: 10 mL via INTRAVENOUS
  Administered 2023-11-30: 3 mL via INTRAVENOUS

## 2023-11-22 MED ORDER — IPRATROPIUM-ALBUTEROL 0.5-2.5 (3) MG/3ML IN SOLN
3.0000 mL | Freq: Once | RESPIRATORY_TRACT | Status: AC
  Administered 2023-11-22: 3 mL via RESPIRATORY_TRACT
  Filled 2023-11-22: qty 3

## 2023-11-22 MED ORDER — IOHEXOL 350 MG/ML SOLN
75.0000 mL | Freq: Once | INTRAVENOUS | Status: AC | PRN
  Administered 2023-11-22: 75 mL via INTRAVENOUS

## 2023-11-22 MED ORDER — PREDNISONE 20 MG PO TABS
60.0000 mg | ORAL_TABLET | Freq: Once | ORAL | Status: AC
  Administered 2023-11-22: 60 mg via ORAL
  Filled 2023-11-22: qty 3

## 2023-11-22 MED ORDER — AZITHROMYCIN 250 MG PO TABS: 250.0000 mg | ORAL_TABLET | Freq: Every day | ORAL | 0 refills | Status: DC

## 2023-11-22 MED ORDER — FLUCONAZOLE IN SODIUM CHLORIDE 400-0.9 MG/200ML-% IV SOLN
400.0000 mg | INTRAVENOUS | Status: DC
  Administered 2023-11-23 – 2023-11-29 (×8): 400 mg via INTRAVENOUS
  Filled 2023-11-22 (×9): qty 200

## 2023-11-22 MED ORDER — PREDNISONE 20 MG PO TABS: 40.0000 mg | ORAL_TABLET | Freq: Every day | ORAL | 0 refills | Status: DC

## 2023-11-22 MED ORDER — PANTOPRAZOLE SODIUM 40 MG IV SOLR
40.0000 mg | Freq: Two times a day (BID) | INTRAVENOUS | Status: DC
  Administered 2023-11-23 – 2023-11-30 (×16): 40 mg via INTRAVENOUS
  Filled 2023-11-22 (×16): qty 10

## 2023-11-22 MED ORDER — IOHEXOL 9 MG/ML PO SOLN
500.0000 mL | Freq: Once | ORAL | Status: AC
  Administered 2023-11-22: 500 mL via ORAL

## 2023-11-22 MED ORDER — PIPERACILLIN-TAZOBACTAM 3.375 G IVPB
3.3750 g | Freq: Three times a day (TID) | INTRAVENOUS | Status: DC
  Administered 2023-11-22 – 2023-11-28 (×17): 3.375 g via INTRAVENOUS
  Filled 2023-11-22 (×19): qty 50

## 2023-11-22 MED ORDER — PIPERACILLIN-TAZOBACTAM 3.375 G IVPB 30 MIN
3.3750 g | Freq: Once | INTRAVENOUS | Status: AC
  Administered 2023-11-22: 3.375 g via INTRAVENOUS
  Filled 2023-11-22: qty 50

## 2023-11-22 MED ORDER — ONDANSETRON HCL 4 MG/2ML IJ SOLN
4.0000 mg | Freq: Four times a day (QID) | INTRAMUSCULAR | Status: DC | PRN
  Administered 2023-11-29: 4 mg via INTRAVENOUS
  Filled 2023-11-22: qty 2

## 2023-11-22 MED ORDER — ALBUTEROL SULFATE HFA 108 (90 BASE) MCG/ACT IN AERS: 1.0000 | INHALATION_SPRAY | RESPIRATORY_TRACT | Status: DC | PRN

## 2023-11-22 MED ORDER — FLUCONAZOLE 150 MG PO TABS
150.0000 mg | ORAL_TABLET | Freq: Once | ORAL | Status: AC
  Administered 2023-11-22: 150 mg via ORAL
  Filled 2023-11-22: qty 1

## 2023-11-22 MED ORDER — HYDROMORPHONE HCL 1 MG/ML IJ SOLN
0.5000 mg | INTRAMUSCULAR | Status: DC | PRN
  Administered 2023-11-25 – 2023-11-27 (×5): 1 mg via INTRAVENOUS
  Filled 2023-11-22 (×7): qty 1

## 2023-11-22 MED ORDER — IOHEXOL 350 MG/ML SOLN
50.0000 mL | Freq: Once | INTRAVENOUS | Status: AC | PRN
  Administered 2023-11-22: 50 mL via INTRAVENOUS

## 2023-11-22 NOTE — Progress Notes (Signed)
 Pharmacy Antibiotic Note  Henry Kramer is a 53 y.o. male for which pharmacy has been consulted for  fluconazole   dosing for  suspected gastric perforation .  Patient with a history of GERD, substance abuse, homelessness. Patient presenting with abdominal pain/nausea.  CT is concerning for possible perforation of gastric ulcer.   SCr 0.96  Plan: Fluconazole  400mg  q24h Monitor WBC, fever, renal function, cultures De-escalate when able     Temp (24hrs), Avg:97.8 F (36.6 C), Min:97.6 F (36.4 C), Max:97.9 F (36.6 C)  Recent Labs  Lab 11/22/23 0933  WBC 18.9*  CREATININE 0.96    CrCl cannot be calculated (Unknown ideal weight.).    No Known Allergies  Microbiology results: Pending  Thank you for allowing pharmacy to be a part of this patient's care.  Dorn Buttner, PharmD, BCPS 11/22/2023 9:59 PM ED Clinical Pharmacist -  [phone]

## 2023-11-22 NOTE — Progress Notes (Signed)
 Pharmacy Antibiotic Note  Henry Kramer is a 53 y.o. male for which pharmacy has been consulted for zosyn  dosing for  IAI .  SCr 0.96  Plan: Zosyn  3.375g IV q8h (4 hour infusion) Monitor WBC, fever, renal function, cultures De-escalate when able     Temp (24hrs), Avg:97.8 F (36.6 C), Min:97.6 F (36.4 C), Max:97.9 F (36.6 C)  Recent Labs  Lab 11/22/23 0933  WBC 18.9*  CREATININE 0.96    CrCl cannot be calculated (Unknown ideal weight.).    No Known Allergies  Microbiology results: Pending  Thank you for allowing pharmacy to be a part of this patient's care.  Dorn Buttner, PharmD, BCPS 11/22/2023 7:42 PM ED Clinical Pharmacist -  [phone]

## 2023-11-22 NOTE — ED Triage Notes (Signed)
 Pt c/o L flank pain ongoing for over a week that has been causing SOB. Pt denies urinary symptoms, N/V.

## 2023-11-22 NOTE — ED Notes (Signed)
 Pt watching tv no pain blankets and a pillow  given  for comfort

## 2023-11-22 NOTE — Progress Notes (Signed)
 ED Pharmacy Antibiotic Sign Off An antibiotic consult was received from an ED provider for zosyn  per pharmacy dosing for  IAI . A chart review was completed to assess appropriateness.  The following one time order(s) were placed per pharmacy consult:  zosyn  3.375g x 1 dose  Further antibiotic and/or antibiotic pharmacy consults should be ordered by the admitting provider if indicated.   Thank you for allowing pharmacy to be a part of this patient's care.   Dorn Buttner, PharmD, BCPS 11/22/2023 3:21 PM ED Clinical Pharmacist -  [phone]

## 2023-11-22 NOTE — Consult Note (Signed)
 Henry Kramer [birth year]  [id number].    Requesting MD: Henry Kramer: Concern for gastric perforation   HPI:  [name] w/ a past medical history of GERD, chronic nausea, marijuana use, substance abuse, and homelessness who presented to the ED with more than 1 month of vague abdominal pain.  He reports that worsened sometime between Christmas and New Years and given the persistence he decided to present to the ED.  He is tolerating PO and having bowel movements.  He does endorse unintentional weight loss.  He denies fevers, chills or malaise. He has a history of reflux but is not on any medication.   WBC 18.  He is AF and HDS.    ROS: Review of Systems  Constitutional: Negative.   HENT: Negative.    Eyes: Negative.   Respiratory: Negative.    Cardiovascular: Negative.   Gastrointestinal:  Positive for abdominal pain.  Genitourinary: Negative.   Musculoskeletal: Negative.   Skin: Negative.   Neurological: Negative.   Endo/Heme/Allergies: Negative.   Psychiatric/Behavioral: Negative.      Family History  Problem Relation Age of Onset   Cancer Mother    Alcohol abuse Father    Cirrhosis Father    Cancer Sister    Diabetes Brother    Obesity Brother     Past Medical History:  Diagnosis Date   AKI (acute kidney injury) (HCC) 05/22/2014   Chronic nausea    GERD (gastroesophageal reflux disease)    Homelessness    Marijuana use    Substance abuse (HCC)    tobacco, marijuana, EtOH    Past Surgical History:  Procedure Laterality Date   NO PAST SURGERIES      Social History:  reports that he has been smoking cigarettes. He has a 11 pack-year smoking history. He has never used smokeless tobacco. He reports current alcohol use. He reports current drug use. Drug: Marijuana.  Allergies: No Known Allergies  (Not in a hospital admission)   Physical Exam: Blood pressure 103/70, pulse 78, temperature 97.6  F (36.4 C), temperature source Oral, resp. rate 19, SpO2 100%. Gen: male, NAD Abd: soft, non-distended, mild TTP in the LUQ, no rebound/guarding, no peritoneal signs  Results for orders placed or performed during the hospital encounter of 11/22/23 (from the past 48 hours)  Urinalysis, Routine w reflex microscopic -Urine, Clean Catch     Status: None   Collection Time: 11/22/23  9:31 AM  Result Value Ref Range   Color, Urine YELLOW YELLOW   APPearance CLEAR CLEAR   Specific Gravity, Urine 1.015 1.005 - 1.030   pH 5.0 5.0 - 8.0   Glucose, UA NEGATIVE NEGATIVE mg/dL   Hgb urine dipstick NEGATIVE NEGATIVE   Bilirubin Urine NEGATIVE NEGATIVE   Ketones, ur NEGATIVE NEGATIVE mg/dL   Protein, ur NEGATIVE NEGATIVE mg/dL   Nitrite NEGATIVE NEGATIVE   Leukocytes,Ua NEGATIVE NEGATIVE    Comment: Performed at [facility], [address], [place], [place] [address]  Lipase, blood     Status: None   Collection Time: 11/22/23  9:33 AM  Result Value Ref Range   Lipase 47 11 - 51 U/L    Comment: Performed at [facility], [address], [place], [place] [address]  Comprehensive metabolic panel     Status: Abnormal   Collection Time: 11/22/23  9:33 AM  Result Value Ref Range   Sodium 140 135 -  145 mmol/L   Potassium 4.5 3.5 - 5.1 mmol/L   Chloride 101 98 - 111 mmol/L   CO2 27 22 - 32 mmol/L   Glucose, Bld 95 70 - 99 mg/dL    Comment: Glucose reference range applies only to samples taken after fasting for at least 8 hours.   BUN 25 (H) 6 - 20 mg/dL   Creatinine, Ser 9.03 0.61 - 1.24 mg/dL   Calcium  8.9 8.9 - 10.3 mg/dL   Total Protein 7.5 6.5 - 8.1 g/dL   Albumin 2.4 (L) 3.5 - 5.0 g/dL   AST 54 (H) 15 - 41 U/L   ALT 91 (H) 0 - 44 U/L   Alkaline Phosphatase 136 (H) 38 - 126 U/L   Total Bilirubin 0.3 0.0 - 1.2 mg/dL   GFR, Estimated >39 >39 mL/min    Comment: (NOTE) Calculated using the CKD-EPI Creatinine Equation (2021)    Anion gap 12 5 - 15    Comment: Performed at [facility], [address] [place], [place] [address]  CBC     Status: Abnormal   Collection Time: 11/22/23  9:33 AM  Result Value Ref Range   WBC 18.9 (H) 4.0 - 10.5 K/uL   RBC 4.40 4.22 - 5.81 MIL/uL   Hemoglobin 11.8 (L) 13.0 - 17.0 g/dL   HCT 65.5 (L) 60.9 - 47.9 %   MCV 78.2 (L) 80.0 - 100.0 fL   MCH 26.8 26.0 - 34.0 pg   MCHC 34.3 30.0 - 36.0 g/dL   RDW 84.8 88.4 - 84.4 %   Platelets 813 (H) 150 - 400 K/uL   nRBC 0.0 0.0 - 0.2 %    Comment: Performed at [facility], [address] [place], [place] [address]  Troponin I (High Sensitivity)     Status: None   Collection Time: 11/22/23  2:43 PM  Result Value Ref Range   Troponin I (High Sensitivity) 12 <18 ng/L    Comment: (NOTE) Elevated high sensitivity troponin I (hsTnI) values and significant  changes across serial measurements may suggest ACS but many other  chronic and acute conditions are known to elevate hsTnI results.  Refer to the Links section for chest pain algorithms and additional  guidance. Performed at [facility], [address] [place], [place] [address]    CT ABDOMEN PELVIS W CONTRAST Result Date: 11/22/2023 CLINICAL DATA:  Left flank pain. EXAM: CT ABDOMEN AND PELVIS WITH CONTRAST TECHNIQUE: Multidetector CT imaging of the abdomen and pelvis was performed using the standard protocol following bolus administration of intravenous contrast. RADIATION DOSE REDUCTION: This exam was performed according to the departmental dose-optimization program which includes automated exposure control, adjustment of the mA and/or kV according to patient size and/or use of iterative reconstruction technique. CONTRAST:  75mL OMNIPAQUE  IOHEXOL  350 MG/ML SOLN COMPARISON:  MRI 08/11/2016 FINDINGS: Lower chest: No pleural effusion or consolidative change identified. Hepatobiliary: Previously characterized liver hemangiomas are again identified within the anterior right lobe and far lateral left lobe. The left lobe of  liver hemangioma measures 2.8 cm, image 1.6/3. Anterior right hepatic lobe hemangioma measures 1.1 cm, image 119/3. Gallbladder appears normal. No bile duct dilatation Pancreas: Unremarkable. No pancreatic ductal dilatation or surrounding inflammatory changes. Spleen: Normal in size without focal abnormality. Adrenals/Urinary Tract: Normal adrenal glands. No nephrolithiasis, hydronephrosis or suspicious mass. Urinary bladder appears normal. Stomach/Bowel: Assessment of bowel pathology is diminished due to lack of enteric contrast material. There is abnormal wall thickening involving  the gastric fundus there is a gas and fluid collection between the proximal stomach measuring 6.1 x 1.4 by 4.1 cm. There is an air-fluid level within this fluid collection which may communicate with the lumen of the stomach, image 20/3. This area is difficult to scratch edema and soft tissue stranding is noted within the lesser sac, there is diffuse edema identified within the lesser sac. The small bowel loops appear nondilated. Scattered mildly dilated loops of small bowel are identified which measure up to 2.2 cm. Lack of enteric contrast material and paucity of visceral fat limits assessment for the appendix which is not confidently identified. Vascular/Lymphatic: Normal caliber of the abdominal aorta. No adenopathy identified. Reproductive: Prostate gland enlargement. Other: No significant free fluid identified. No signs of free perforation. Musculoskeletal: No acute or significant osseous findings. IMPRESSION: 1. There is abnormal wall thickening involving the gastric fundus. There is a gas and fluid collection between the proximal stomach measuring 6.1 x 1.4 x 4.1 cm. There is an air-fluid level within this fluid collection which may communicate with the lumen of the stomach. Findings are concerning for gastric ulceration with contained perforation. Consider further evaluation with repeat CT of the abdomen following oral contrast  and IV contrast administration. 2. Scattered mildly dilated loops of small bowel are identified which measure up to 2.2 cm. Findings are favored to represent ileus. 3. Lack of enteric contrast material and paucity of visceral fat limits assessment for the appendix which is not confidently identified. 4. Prostate gland enlargement. 5. Previously characterized liver hemangiomas are again identified. Electronically Signed   By: Waddell Calk M.D.   On: 11/22/2023 15:11   CT Angio Chest PE W and/or Wo Contrast Result Date: 11/22/2023 CLINICAL DATA:  Shortness of breath for 1 week. Rule out pulmonary embolus. EXAM: CT ANGIOGRAPHY CHEST WITH CONTRAST TECHNIQUE: Multidetector CT imaging of the chest was performed using the standard protocol during bolus administration of intravenous contrast. Multiplanar CT image reconstructions and MIPs were obtained to evaluate the vascular anatomy. RADIATION DOSE REDUCTION: This exam was performed according to the departmental dose-optimization program which includes automated exposure control, adjustment of the mA and/or kV according to patient size and/or use of iterative reconstruction technique. CONTRAST:  75mL OMNIPAQUE  IOHEXOL  350 MG/ML SOLN COMPARISON:  None Available. FINDINGS: Cardiovascular: Satisfactory opacification of the pulmonary arteries to the segmental level. No evidence of pulmonary embolism. Normal heart size. No pericardial effusion. Mediastinum/Nodes: No enlarged mediastinal, hilar, or axillary lymph nodes. Thyroid  gland, trachea, and esophagus demonstrate no significant findings. Lungs/Pleura: Bilateral increased thickening of the peribronchovascular interstitium. Signs of mucous plugging identified within the lower lobe airways bilaterally. No signs of pleural effusion, airspace consolidation, atelectasis or pneumothorax. No suspicious pulmonary nodule or mass identified bilaterally. Upper Abdomen: No acute abnormality. See report for abdomen and pelvis CT  also performed today. Musculoskeletal: No chest wall abnormality. No acute or significant osseous findings. Review of the MIP images confirms the above findings. IMPRESSION: 1. No evidence for acute pulmonary embolus. 2. Bilateral increased thickening of the peribronchovascular interstitium. Signs of mucous plugging identified within the lower lobe airways bilaterally. Findings are nonspecific but may reflect bronchitis. Electronically Signed   By: Waddell Calk M.D.   On: 11/22/2023 14:56   DG Chest 2 View Result Date: 11/22/2023 CLINICAL DATA:  53 year old male with history of shortness of breath. EXAM: CHEST - 2 VIEW COMPARISON:  Chest x-ray 03/08/2008. FINDINGS: Lung volumes are normal. No consolidative airspace disease. No pleural effusions. No pneumothorax. No pulmonary  nodule or mass noted. Pulmonary vasculature and the cardiomediastinal silhouette are within normal limits. IMPRESSION: No radiographic evidence of acute cardiopulmonary disease. Electronically Signed   By: Toribio Aye M.D.   On: 11/22/2023 10:02    Assessment/Plan 53 y/o M w/ a several weeks history of abdominal pain who has a CT scan showing a perigastric fluid collection c/f possible perforation  - There is no indication for urgent surgical intervention.  He is clinically stable and has very minimal pain on exam.  - There is concern for perforation based on his history and imaging, but given his clinical picture it is possible that the perforation has sealed.  I have ordered a CT w/ PO contrast to eval for active leak - Admit to medicine - NPO  - Zosyn /Diflucan  - Surgery will follow up the result of the CT.  Please page the on call team if there are any clinical changes.   Cordella DELENA Polly Marlis Cheron Surgery 11/22/2023, 5:39 PM Please see Amion for pager number during day hours 7:00am-4:30pm or 7:00am -11:30am on weekends

## 2023-11-22 NOTE — ED Provider Notes (Signed)
 [facility] Provider Note   CSN: [id number] Arrival date & time: 11/22/23  0908     History  Chief Complaint  Patient presents with   Shortness of Breath   Flank Pain    Henry Kramer is a 53 y.o. male patient with past medical history of GERD, chronic nausea, marijuana use, substance abuse, homelessness presenting to emergency room complaining of left lower chest pain and shortness of breath.  Patient reports this has been ongoing for about a week he has never had anything like this in the past.  Patient reports symptoms are worse when he is riding his bike.  Feels like he is short of breath right now.  Denies any cough, fever, chills or decreased appetite. Reports chronic nausea.    Shortness of Breath Flank Pain Associated symptoms include shortness of breath.       Home Medications Prior to Admission medications   Medication Sig Start Date End Date Taking? Authorizing Provider  acetaminophen  (TYLENOL ) 325 MG tablet Take 2 tablets (650 mg total) by mouth every 6 (six) hours as needed for mild pain or headache. 03/31/18   Earley Saucer, MD      Allergies    Patient has no known allergies.    Review of Systems   Review of Systems  Respiratory:  Positive for shortness of breath.     Physical Exam Updated Vital Signs BP 120/79 (BP Location: Right Arm)   Pulse (!) 106   Temp 97.9 F (36.6 C)   Resp 18   SpO2 97%  Physical Exam Vitals and nursing note reviewed.  Constitutional:      General: He is not in acute distress.    Appearance: He is not toxic-appearing.  HENT:     Head: Normocephalic and atraumatic.  Eyes:     General: No scleral icterus.    Conjunctiva/sclera: Conjunctivae normal.  Neck:     Vascular: No carotid bruit.  Cardiovascular:     Rate and Rhythm: Normal rate and regular rhythm.     Pulses: Normal pulses.     Heart sounds: Normal heart sounds.  Pulmonary:     Effort: Pulmonary effort is  normal. No respiratory distress.     Breath sounds: Wheezing present.     Comments: Diffuse mild wheezing on exam.  Tenderness to palpation over anterior lower left ribs.  No deformity noted. Chest:     Chest wall: Tenderness present.  Abdominal:     General: Abdomen is flat. Bowel sounds are normal. There is no distension.     Palpations: Abdomen is soft. There is no mass.     Tenderness: There is abdominal tenderness.     Comments: Mild tenderness to palpation over left upper quadrant.  Musculoskeletal:     Right lower leg: No edema.     Left lower leg: No edema.  Lymphadenopathy:     Cervical: No cervical adenopathy.  Skin:    General: Skin is warm and dry.     Findings: No lesion.  Neurological:     General: No focal deficit present.     Mental Status: He is alert and oriented to person, place, and time. Mental status is at baseline.     ED Results / Procedures / Treatments   Labs (all labs ordered are listed, but only abnormal results are displayed) Labs Reviewed  COMPREHENSIVE METABOLIC PANEL - Abnormal; Notable for the following components:      Result Value  BUN 25 (*)    Albumin 2.4 (*)    AST 54 (*)    ALT 91 (*)    Alkaline Phosphatase 136 (*)    All other components within normal limits  CBC - Abnormal; Notable for the following components:   WBC 18.9 (*)    Hemoglobin 11.8 (*)    HCT 34.4 (*)    MCV 78.2 (*)    Platelets 813 (*)    All other components within normal limits  LIPASE, BLOOD  URINALYSIS, ROUTINE W REFLEX MICROSCOPIC  TROPONIN I (HIGH SENSITIVITY)    EKG EKG Interpretation Date/Time:  Sunday November 22 2023 09:29:26 EST Ventricular Rate:  103 PR Interval:  114 QRS Duration:  102 QT Interval:  328 QTC Calculation: 429 R Axis:   -64  Text Interpretation: Sinus tachycardia Right atrial enlargement Pulmonary disease pattern Incomplete right bundle branch block Left anterior fascicular block Abnormal ECG When compared with ECG of  09-Mar-2018 03:50, Since last tracing rate faster Confirmed by Randol Simmonds [phone]) on 11/22/2023 12:31:53 PM  Radiology DG Chest 2 View Result Date: 11/22/2023 CLINICAL DATA:  53 year old male with history of shortness of breath. EXAM: CHEST - 2 VIEW COMPARISON:  Chest x-ray 03/08/2008. FINDINGS: Lung volumes are normal. No consolidative airspace disease. No pleural effusions. No pneumothorax. No pulmonary nodule or mass noted. Pulmonary vasculature and the cardiomediastinal silhouette are within normal limits. IMPRESSION: No radiographic evidence of acute cardiopulmonary disease. Electronically Signed   By: Toribio Aye M.D.   On: 11/22/2023 10:02    Procedures Procedures    Medications Ordered in ED Medications  albuterol  (VENTOLIN  HFA) 108 (90 Base) MCG/ACT inhaler 1 puff (has no administration in time range)  pantoprazole  (PROTONIX ) injection 40 mg (40 mg Intravenous Given 11/22/23 1601)    Followed by  pantoprazole  (PROTONIX ) injection 40 mg (has no administration in time range)  ipratropium-albuterol  (DUONEB) 0.5-2.5 (3) MG/3ML nebulizer solution 3 mL (3 mLs Nebulization Given 11/22/23 1433)  predniSONE  (DELTASONE ) tablet 60 mg (60 mg Oral Given 11/22/23 1432)  iohexol  (OMNIPAQUE ) 350 MG/ML injection 75 mL (75 mLs Intravenous Contrast Given 11/22/23 1418)  piperacillin -tazobactam (ZOSYN ) IVPB 3.375 g (0 g Intravenous Stopped 11/22/23 1635)  fluconazole  (DIFLUCAN ) tablet 150 mg (150 mg Oral Given 11/22/23 1758)    ED Course/ Medical Decision Making/ A&P                                 Medical Decision Making Amount and/or Complexity of Data Reviewed Labs: ordered. Radiology: ordered.  Risk Prescription drug management. Decision regarding hospitalization.   Henry Kramer 53 y.o. presented today for chest pain. Working DDx that I considered at this time includes, but not limited to, ACS, GERD, pe, pna, aortic dissection, pneumothorax, MSK path, anemia, esophageal rupture, CHF  exacerbation, valvular disorder, myocarditis, pericarditis, endocarditis, pericardial effusion/cardiac tamponade, pulmonary edema, gastritis/PUD, esophagitis.  R/o Dx: These are considered less likely due to history of present illness and physical exam findings.  PE: advanced imaging will not be ordered as alternative diagnoses are more likely at this time Aortic Dissection: less likely based on the history of present illness - location, quality, onset, and severity of symptoms in this case.   Review of prior external notes: None  Unique Tests and My Interpretation:  EKG: Rate, rhythm, axis, intervals all examined: Sinus tachycardia, right atrial enlargement   CBC with leukocytosis, hemoglobin 11.8.  UA without urinary tract infection.  Lipase 47.  CMP without electrolyte abnormality slight increase in AST and ALT which I suspect is secondary to alcohol use.  Patient does not have any right upper quadrant discomfort on exam, no jaundice.  Will further evaluate with imaging of abdomen.  Problem List / ED Course / Critical interventions / Medication management  Reporting to emergency room with left lower rib pain/ abdominal pain and shortness of breath.  He has noted shortness of breath with long distances like walking from the lobby or riding his bike.  Does not have worsening shortness of breath when he is lying down flat.  Patient hemodynamically stable slightly tachycardic on arrival but not requiring oxygen.  Labs with leukocytosis, no anemia.  Chest x-ray without acute abnormality.  Obtaining imaging to rule out PE as well as CT abdomen pelvis due to left upper quadrant pain.  Patient has history of smoking every day has never been formally diagnosed with COPD, suspicious that shortness of breath is bronchitis versus PE.  I ordered medication including Prednisone  and DuoNeb for SOB, Zosyn  and protonix  for infection, gastric ulcer  Reevaluation of the patient after these medicines showed that the  patient improved Patients vitals assessed. Upon arrival patient is hemodynamically stable.  I have reviewed the patients home medicines and have made adjustments as needed    Plan:  Signed off to oncoming PA, dispo pending troponin and imaging.  Consult to gen surg, admit           Final Clinical Impression(s) / ED Diagnoses Final diagnoses:  Gastric ulcer with perforation, unspecified chronicity (HCC)  Bronchitis    Rx / DC Orders ED Discharge Orders     None         Shermon Dariyon Urquilla N, PA-C 11/22/23 1810    Randol Simmonds, MD 11/22/23 [place]

## 2023-11-22 NOTE — H&P (Signed)
 History and Physical    Henry Kramer FMW:[id number] DOB: 1970-11-29 DOA: 11/22/2023  PCP: Scarlett Ronal Caldron, NP   Patient coming from: Home   Chief Complaint: Abdominal pain, nausea   HPI: Henry Kramer is a 53 y.o. male with medical history significant for GERD, chronic nausea, substance abuse, and homelessness who presents with abdominal discomfort and nausea.  Patient describes pain in the mid abdomen that radiates around towards the left flank which has been associated with nausea and began a couple days before Christmas.  Pain has been more severe and persistent for the past week.  He has nausea that can be severe at times but has been able to tolerate food and drink and continues to move his bowels.  He has had some shortness of breath associated with this but denies cough.  He has tried taking NyQuil without relief.  ED Course: Upon arrival to the ED, patient is found to be afebrile and saturating well on room air with stable blood pressure.  CT is concerning for gastric wall thickening with gas and fluid collection which could reflect contained perforation of gastric ulcer.  Surgery was consulted by the ED PA and the patient was treated with Zosyn , oral Diflucan , IV Protonix , prednisone , and DuoNeb.  Review of Systems:  All other systems reviewed and apart from HPI, are negative.  Past Medical History:  Diagnosis Date   AKI (acute kidney injury) (HCC) 05/22/2014   Chronic nausea    GERD (gastroesophageal reflux disease)    Homelessness    Marijuana use    Substance abuse (HCC)    tobacco, marijuana, EtOH    Past Surgical History:  Procedure Laterality Date   NO PAST SURGERIES      Social History:   reports that he has been smoking cigarettes. He has a 11 pack-year smoking history. He has never used smokeless tobacco. He reports current alcohol use. He reports current drug use. Drug: Marijuana.  No Known Allergies  Family History  Problem Relation Age of Onset    Cancer Mother    Alcohol abuse Father    Cirrhosis Father    Cancer Sister    Diabetes Brother    Obesity Brother      Prior to Admission medications   Medication Sig Start Date End Date Taking? Authorizing Provider  albuterol  (VENTOLIN  HFA) 108 (90 Base) MCG/ACT inhaler Inhale 1-2 puffs into the lungs every 6 (six) hours as needed for wheezing or shortness of breath. 11/22/23  Yes Barrett, Jamie N, PA-C  amoxicillin -clavulanate (AUGMENTIN ) 875-125 MG tablet Take 1 tablet by mouth every 12 (twelve) hours. 11/22/23  Yes Barrett, Warren SAILOR, PA-C  azithromycin  (ZITHROMAX ) 250 MG tablet Take 1 tablet (250 mg total) by mouth daily. Take first 2 tablets together, then 1 every day until finished. 11/22/23  Yes Barrett, Jamie N, PA-C  predniSONE  (DELTASONE ) 20 MG tablet Take 2 tablets (40 mg total) by mouth daily. 11/22/23  Yes Barrett, Jamie N, PA-C  Pseudoeph-Doxylamine-DM-APAP (NYQUIL PO) Take 2 tablets by mouth at bedtime as needed (cold symptoms).   Yes [provider]    Physical Exam: Vitals:   11/22/23 1600 11/22/23 1758 11/22/23 1815 11/22/23 2000  BP: 103/70  115/68 110/63  Pulse: 78  74 81  Resp: 19  18 (!) 0  Temp:  97.9 F (36.6 C)    TempSrc:  Oral    SpO2: 100%  100% 99%    Constitutional: NAD, no pallor or diaphoresis   Eyes: PERTLA,  lids and conjunctivae normal ENMT: Mucous membranes are moist. Posterior pharynx clear of any exudate or lesions.   Neck: supple, no masses  Respiratory: no wheezing, no crackles. No accessory muscle use.  Cardiovascular: S1 & S2 heard, regular rate and rhythm. No extremity edema.   Abdomen: soft, tender without guarding. Bowel sounds active.  Musculoskeletal: no clubbing / cyanosis. No joint deformity upper and lower extremities.   Skin: no significant rashes, lesions, ulcers. Warm, dry, well-perfused. Neurologic: CN 2-12 grossly intact. Moving all extremities. Alert and oriented.  Psychiatric: Calm. Cooperative.    Labs and Imaging  on Admission: I have personally reviewed following labs and imaging studies  CBC: Recent Labs  Lab 11/22/23 0933  WBC 18.9*  HGB 11.8*  HCT 34.4*  MCV 78.2*  PLT 813*   Basic Metabolic Panel: Recent Labs  Lab 11/22/23 0933  NA 140  K 4.5  CL 101  CO2 27  GLUCOSE 95  BUN 25*  CREATININE 0.96  CALCIUM  8.9   GFR: CrCl cannot be calculated (Unknown ideal weight.). Liver Function Tests: Recent Labs  Lab 11/22/23 0933  AST 54*  ALT 91*  ALKPHOS 136*  BILITOT 0.3  PROT 7.5  ALBUMIN 2.4*   Recent Labs  Lab 11/22/23 0933  LIPASE 47   No results for input(s): AMMONIA in the last 168 hours. Coagulation Profile: No results for input(s): INR, PROTIME in the last 168 hours. Cardiac Enzymes: No results for input(s): CKTOTAL, CKMB, CKMBINDEX, TROPONINI in the last 168 hours. BNP (last 3 results) No results for input(s): PROBNP in the last 8760 hours. HbA1C: No results for input(s): HGBA1C in the last 72 hours. CBG: No results for input(s): GLUCAP in the last 168 hours. Lipid Profile: No results for input(s): CHOL, HDL, LDLCALC, TRIG, CHOLHDL, LDLDIRECT in the last 72 hours. Thyroid  Function Tests: No results for input(s): TSH, T4TOTAL, FREET4, T3FREE, THYROIDAB in the last 72 hours. Anemia Panel: No results for input(s): VITAMINB12, FOLATE, FERRITIN, TIBC, IRON, RETICCTPCT in the last 72 hours. Urine analysis:    Component Value Date/Time   COLORURINE YELLOW 11/22/2023 0931   APPEARANCEUR CLEAR 11/22/2023 0931   APPEARANCEUR Clear 07/30/2016 1522   LABSPEC 1.015 11/22/2023 0931   PHURINE 5.0 11/22/2023 0931   GLUCOSEU NEGATIVE 11/22/2023 0931   HGBUR NEGATIVE 11/22/2023 0931   BILIRUBINUR NEGATIVE 11/22/2023 0931   BILIRUBINUR Negative 07/30/2016 1522   KETONESUR NEGATIVE 11/22/2023 0931   PROTEINUR NEGATIVE 11/22/2023 0931   UROBILINOGEN 0.2 01/15/2015 1821   NITRITE NEGATIVE 11/22/2023 0931    LEUKOCYTESUR NEGATIVE 11/22/2023 0931   Sepsis Labs: @LABRCNTIP (procalcitonin:4,lacticidven:4) )No results found for this or any previous visit (from the past 240 hours).   Radiological Exams on Admission: CT ABDOMEN PELVIS W CONTRAST Result Date: 11/22/2023 CLINICAL DATA:  Duodenal ulcer evaluation for gastric perforation. EXAM: CT ABDOMEN AND PELVIS WITH CONTRAST TECHNIQUE: Multidetector CT imaging of the abdomen and pelvis was performed using the standard protocol following bolus administration of intravenous contrast. RADIATION DOSE REDUCTION: This exam was performed according to the departmental dose-optimization program which includes automated exposure control, adjustment of the mA and/or kV according to patient size and/or use of iterative reconstruction technique. CONTRAST:  50mL OMNIPAQUE  IOHEXOL  350 MG/ML SOLN COMPARISON:  CT 11/22/2023 at 2:19 p.m. FINDINGS: Lower chest: No acute abnormality. Hepatobiliary: Scattered hemangiomas. No acute abnormality. Normal gallbladder and biliary tree. Pancreas: Unremarkable. Spleen: Unremarkable. Adrenals/Urinary Tract: Normal adrenal glands. No urinary calculi or hydronephrosis. Contrast from prior CT within the ureters and bladder. Stomach/Bowel: Redemonstrated  gastric wall thickening about the fundus. Gas and fluid collection with air-fluid level protruding from the gastric fundus measures 5.5 x 4.7 cm, grossly similar to earlier today. Enteric contrast is present within the stomach and small bowel. No discrete oral contrast is visualized within the gas and fluid collection however the overall density has increased compared to earlier today (Hounsfield units 38 versus 8 on the previous study). Soft tissue edema and stranding within the lesser sac. No bowel obstruction.  The appendix is not confidently visualized. Vascular/Lymphatic: No significant vascular findings are present. No enlarged abdominal or pelvic lymph nodes. Reproductive: Enlarged prostate.  Other: No free intraperitoneal air. Musculoskeletal: No acute fracture. IMPRESSION: Findings remain concerning for gastric ulceration with contained perforation. No discrete oral contrast is visualized within the gas and fluid collection however the overall density has increased compared to earlier today which may be due to dilute oral contrast within the collection. Primary differential consideration is gastric diverticulitis. Electronically Signed   By: Norman Gatlin M.D.   On: 11/22/2023 21:25   CT ABDOMEN PELVIS W CONTRAST Result Date: 11/22/2023 CLINICAL DATA:  Left flank pain. EXAM: CT ABDOMEN AND PELVIS WITH CONTRAST TECHNIQUE: Multidetector CT imaging of the abdomen and pelvis was performed using the standard protocol following bolus administration of intravenous contrast. RADIATION DOSE REDUCTION: This exam was performed according to the departmental dose-optimization program which includes automated exposure control, adjustment of the mA and/or kV according to patient size and/or use of iterative reconstruction technique. CONTRAST:  75mL OMNIPAQUE  IOHEXOL  350 MG/ML SOLN COMPARISON:  MRI 08/11/2016 FINDINGS: Lower chest: No pleural effusion or consolidative change identified. Hepatobiliary: Previously characterized liver hemangiomas are again identified within the anterior right lobe and far lateral left lobe. The left lobe of liver hemangioma measures 2.8 cm, image 1.6/3. Anterior right hepatic lobe hemangioma measures 1.1 cm, image 119/3. Gallbladder appears normal. No bile duct dilatation Pancreas: Unremarkable. No pancreatic ductal dilatation or surrounding inflammatory changes. Spleen: Normal in size without focal abnormality. Adrenals/Urinary Tract: Normal adrenal glands. No nephrolithiasis, hydronephrosis or suspicious mass. Urinary bladder appears normal. Stomach/Bowel: Assessment of bowel pathology is diminished due to lack of enteric contrast material. There is abnormal wall thickening  involving the gastric fundus there is a gas and fluid collection between the proximal stomach measuring 6.1 x 1.4 by 4.1 cm. There is an air-fluid level within this fluid collection which may communicate with the lumen of the stomach, image 20/3. This area is difficult to scratch edema and soft tissue stranding is noted within the lesser sac, there is diffuse edema identified within the lesser sac. The small bowel loops appear nondilated. Scattered mildly dilated loops of small bowel are identified which measure up to 2.2 cm. Lack of enteric contrast material and paucity of visceral fat limits assessment for the appendix which is not confidently identified. Vascular/Lymphatic: Normal caliber of the abdominal aorta. No adenopathy identified. Reproductive: Prostate gland enlargement. Other: No significant free fluid identified. No signs of free perforation. Musculoskeletal: No acute or significant osseous findings. IMPRESSION: 1. There is abnormal wall thickening involving the gastric fundus. There is a gas and fluid collection between the proximal stomach measuring 6.1 x 1.4 x 4.1 cm. There is an air-fluid level within this fluid collection which may communicate with the lumen of the stomach. Findings are concerning for gastric ulceration with contained perforation. Consider further evaluation with repeat CT of the abdomen following oral contrast and IV contrast administration. 2. Scattered mildly dilated loops of small bowel are identified which  measure up to 2.2 cm. Findings are favored to represent ileus. 3. Lack of enteric contrast material and paucity of visceral fat limits assessment for the appendix which is not confidently identified. 4. Prostate gland enlargement. 5. Previously characterized liver hemangiomas are again identified. Electronically Signed   By: Waddell Calk M.D.   On: 11/22/2023 15:11   CT Angio Chest PE W and/or Wo Contrast Result Date: 11/22/2023 CLINICAL DATA:  Shortness of breath for 1  week. Rule out pulmonary embolus. EXAM: CT ANGIOGRAPHY CHEST WITH CONTRAST TECHNIQUE: Multidetector CT imaging of the chest was performed using the standard protocol during bolus administration of intravenous contrast. Multiplanar CT image reconstructions and MIPs were obtained to evaluate the vascular anatomy. RADIATION DOSE REDUCTION: This exam was performed according to the departmental dose-optimization program which includes automated exposure control, adjustment of the mA and/or kV according to patient size and/or use of iterative reconstruction technique. CONTRAST:  75mL OMNIPAQUE  IOHEXOL  350 MG/ML SOLN COMPARISON:  None Available. FINDINGS: Cardiovascular: Satisfactory opacification of the pulmonary arteries to the segmental level. No evidence of pulmonary embolism. Normal heart size. No pericardial effusion. Mediastinum/Nodes: No enlarged mediastinal, hilar, or axillary lymph nodes. Thyroid  gland, trachea, and esophagus demonstrate no significant findings. Lungs/Pleura: Bilateral increased thickening of the peribronchovascular interstitium. Signs of mucous plugging identified within the lower lobe airways bilaterally. No signs of pleural effusion, airspace consolidation, atelectasis or pneumothorax. No suspicious pulmonary nodule or mass identified bilaterally. Upper Abdomen: No acute abnormality. See report for abdomen and pelvis CT also performed today. Musculoskeletal: No chest wall abnormality. No acute or significant osseous findings. Review of the MIP images confirms the above findings. IMPRESSION: 1. No evidence for acute pulmonary embolus. 2. Bilateral increased thickening of the peribronchovascular interstitium. Signs of mucous plugging identified within the lower lobe airways bilaterally. Findings are nonspecific but may reflect bronchitis. Electronically Signed   By: Waddell Calk M.D.   On: 11/22/2023 14:56   DG Chest 2 View Result Date: 11/22/2023 CLINICAL DATA:  53 year old male with  history of shortness of breath. EXAM: CHEST - 2 VIEW COMPARISON:  Chest x-ray 03/08/2008. FINDINGS: Lung volumes are normal. No consolidative airspace disease. No pleural effusions. No pneumothorax. No pulmonary nodule or mass noted. Pulmonary vasculature and the cardiomediastinal silhouette are within normal limits. IMPRESSION: No radiographic evidence of acute cardiopulmonary disease. Electronically Signed   By: Toribio Aye M.D.   On: 11/22/2023 10:02    EKG: Independently reviewed. Sinus tachycardia, rate 103.   Assessment/Plan   1. Suspected gastric perforation  - Appreciate surgery assessment and recommendations  - Continue NPO, Zosyn , Diflucan , and Protonix ; follow-up on CT with oral contrast     DVT prophylaxis: SCDs  Code Status: Full  Level of Care: Level of care:  Family Communication: None present  Disposition Plan:  Patient is from: Home   Anticipated d/c is to: TBD Anticipated d/c date is: 11/24/22  Patient currently: Pending repeat CT Consults called: Surgery  Admission status: Inpatient    Evalene GORMAN Sprinkles, MD [facility]  11/22/2023, 9:34 PM

## 2023-11-22 NOTE — Discharge Instructions (Addendum)
 I have sent steroids, albuterol inhaler and antibiotics to your pharmacy.  Please take as prescribed and follow-up with primary care.  Return to emergency room with new or worsening symptoms.

## 2023-11-22 NOTE — ED Provider Notes (Addendum)
 Patient given in sign out by Barrett, PA-C.  Please review their note for patient HPI, physical exam, workup.  At this time the plan is follow-up on imaging and if negative discharge home with antibiotics and steroids for bronchitis.  CT came back with gastric ulcer perforation.  Patient was given Zosyn  and put on a Protonix  drip and general surgery will be consulted.  Still waiting troponin however do anticipate admission.  Consults: Polly, MD General Surgery; Opyd, MD Hospitalist  I spoke to general surgery and they state they will take the patient to the OR.  Patient is stable to be brought to the OR at this time.  They also recommended given the patient Diflucan  and so this was ordered as well.  Dry surgery evaluated patient states the patient does not need to go to the OR and second go to medicine.  They recommend continuing antibiotics with n.p.o. at midnight and to repeat imaging tomorrow morning.  Hospitalist be consulted and patient stable for admission.  I spoke to the hospitalist and patient was accepted for admission.  Patient stable for admission.  .Critical Care  Performed by: Victor Lynwood DASEN, PA-C Authorized by: Victor Lynwood DASEN, PA-C   Critical care provider statement:    Critical care time (minutes):  30   Critical care time was exclusive of:  Separately billable procedures and treating other patients   Critical care was necessary to treat or prevent imminent or life-threatening deterioration of the following conditions: Perforated gastric ulcer.   Critical care was time spent personally by me on the following activities:  Blood draw for specimens, development of treatment plan with patient or surrogate, discussions with consultants, evaluation of patient's response to treatment, examination of patient, obtaining history from patient or surrogate, review of old charts, re-evaluation of patient's condition, pulse oximetry, ordering and review of radiographic studies, ordering  and review of laboratory studies and ordering and performing treatments and interventions   I assumed direction of critical care for this patient from another provider in my specialty: no     Care discussed with: admitting provider     Care discussed with comment:  General surgeon on-call     Victor Lynwood DASEN, PA-C 11/22/23 1737    Victor Lynwood DASEN, PA-C 11/22/23 1846    Randol Simmonds, MD 11/22/23 1927

## 2023-11-23 ENCOUNTER — Inpatient Hospital Stay (HOSPITAL_COMMUNITY): Payer: Medicaid Other

## 2023-11-23 DIAGNOSIS — D72829 Elevated white blood cell count, unspecified: Secondary | ICD-10-CM | POA: Diagnosis present

## 2023-11-23 DIAGNOSIS — Z681 Body mass index (BMI) 19 or less, adult: Secondary | ICD-10-CM | POA: Diagnosis not present

## 2023-11-23 DIAGNOSIS — J95811 Postprocedural pneumothorax: Secondary | ICD-10-CM | POA: Diagnosis not present

## 2023-11-23 DIAGNOSIS — B954 Other streptococcus as the cause of diseases classified elsewhere: Secondary | ICD-10-CM | POA: Diagnosis present

## 2023-11-23 DIAGNOSIS — K319 Disease of stomach and duodenum, unspecified: Secondary | ICD-10-CM | POA: Diagnosis present

## 2023-11-23 DIAGNOSIS — D75839 Thrombocytosis, unspecified: Secondary | ICD-10-CM | POA: Diagnosis present

## 2023-11-23 DIAGNOSIS — K219 Gastro-esophageal reflux disease without esophagitis: Secondary | ICD-10-CM | POA: Diagnosis present

## 2023-11-23 DIAGNOSIS — K255 Chronic or unspecified gastric ulcer with perforation: Secondary | ICD-10-CM | POA: Diagnosis present

## 2023-11-23 DIAGNOSIS — K251 Acute gastric ulcer with perforation: Secondary | ICD-10-CM

## 2023-11-23 DIAGNOSIS — F1210 Cannabis abuse, uncomplicated: Secondary | ICD-10-CM | POA: Diagnosis present

## 2023-11-23 DIAGNOSIS — Z811 Family history of alcohol abuse and dependence: Secondary | ICD-10-CM | POA: Diagnosis not present

## 2023-11-23 DIAGNOSIS — F32A Depression, unspecified: Secondary | ICD-10-CM | POA: Diagnosis present

## 2023-11-23 DIAGNOSIS — G8929 Other chronic pain: Secondary | ICD-10-CM | POA: Diagnosis present

## 2023-11-23 DIAGNOSIS — R933 Abnormal findings on diagnostic imaging of other parts of digestive tract: Secondary | ICD-10-CM

## 2023-11-23 DIAGNOSIS — E43 Unspecified severe protein-calorie malnutrition: Secondary | ICD-10-CM | POA: Diagnosis present

## 2023-11-23 DIAGNOSIS — R110 Nausea: Secondary | ICD-10-CM

## 2023-11-23 DIAGNOSIS — K449 Diaphragmatic hernia without obstruction or gangrene: Secondary | ICD-10-CM | POA: Diagnosis present

## 2023-11-23 DIAGNOSIS — D638 Anemia in other chronic diseases classified elsewhere: Secondary | ICD-10-CM | POA: Diagnosis present

## 2023-11-23 DIAGNOSIS — Z23 Encounter for immunization: Secondary | ICD-10-CM | POA: Diagnosis not present

## 2023-11-23 DIAGNOSIS — R935 Abnormal findings on diagnostic imaging of other abdominal regions, including retroperitoneum: Secondary | ICD-10-CM

## 2023-11-23 DIAGNOSIS — Z833 Family history of diabetes mellitus: Secondary | ICD-10-CM | POA: Diagnosis not present

## 2023-11-23 DIAGNOSIS — Z5900 Homelessness unspecified: Secondary | ICD-10-CM | POA: Diagnosis not present

## 2023-11-23 DIAGNOSIS — Y838 Other surgical procedures as the cause of abnormal reaction of the patient, or of later complication, without mention of misadventure at the time of the procedure: Secondary | ICD-10-CM | POA: Diagnosis not present

## 2023-11-23 DIAGNOSIS — Z7952 Long term (current) use of systemic steroids: Secondary | ICD-10-CM | POA: Diagnosis not present

## 2023-11-23 DIAGNOSIS — B957 Other staphylococcus as the cause of diseases classified elsewhere: Secondary | ICD-10-CM | POA: Diagnosis present

## 2023-11-23 DIAGNOSIS — F17210 Nicotine dependence, cigarettes, uncomplicated: Secondary | ICD-10-CM | POA: Diagnosis present

## 2023-11-23 DIAGNOSIS — I272 Pulmonary hypertension, unspecified: Secondary | ICD-10-CM | POA: Diagnosis present

## 2023-11-23 DIAGNOSIS — Z79899 Other long term (current) drug therapy: Secondary | ICD-10-CM | POA: Diagnosis not present

## 2023-11-23 DIAGNOSIS — R634 Abnormal weight loss: Secondary | ICD-10-CM

## 2023-11-23 DIAGNOSIS — R1013 Epigastric pain: Secondary | ICD-10-CM

## 2023-11-23 DIAGNOSIS — R64 Cachexia: Secondary | ICD-10-CM | POA: Diagnosis present

## 2023-11-23 DIAGNOSIS — K921 Melena: Secondary | ICD-10-CM

## 2023-11-23 LAB — COMPREHENSIVE METABOLIC PANEL
ALT: 87 U/L — ABNORMAL HIGH (ref 0–44)
AST: 53 U/L — ABNORMAL HIGH (ref 15–41)
Albumin: 2.2 g/dL — ABNORMAL LOW (ref 3.5–5.0)
Alkaline Phosphatase: 134 U/L — ABNORMAL HIGH (ref 38–126)
Anion gap: 14 (ref 5–15)
BUN: 24 mg/dL — ABNORMAL HIGH (ref 6–20)
CO2: 22 mmol/L (ref 22–32)
Calcium: 8.4 mg/dL — ABNORMAL LOW (ref 8.9–10.3)
Chloride: 101 mmol/L (ref 98–111)
Creatinine, Ser: 0.97 mg/dL (ref 0.61–1.24)
GFR, Estimated: >60 mL/min (ref >60)
Glucose, Bld: 127 mg/dL — ABNORMAL HIGH (ref 70–99)
Potassium: 4.5 mmol/L (ref 3.5–5.1)
Sodium: 137 mmol/L (ref 135–145)
Total Bilirubin: 0.4 mg/dL (ref 0.0–1.2)
Total Protein: 6.7 g/dL (ref 6.5–8.1)

## 2023-11-23 LAB — CBC
HCT: 23.8 % — ABNORMAL LOW (ref 39.0–52.0)
Hemoglobin: 8.1 g/dL — ABNORMAL LOW (ref 13.0–17.0)
MCH: 26.6 pg (ref 26.0–34.0)
MCHC: 34 g/dL (ref 30.0–36.0)
MCV: 78.3 fL — ABNORMAL LOW (ref 80.0–100.0)
Platelets: 849 10*3/uL — ABNORMAL HIGH (ref 150–400)
RBC: 3.04 MIL/uL — ABNORMAL LOW (ref 4.22–5.81)
RDW: 15.4 % (ref 11.5–15.5)
WBC: 8.6 10*3/uL (ref 4.0–10.5)
nRBC: 0 % (ref 0.0–0.2)

## 2023-11-23 LAB — GLUCOSE, CAPILLARY: Glucose-Capillary: 94 mg/dL (ref 70–99)

## 2023-11-23 LAB — HIV ANTIBODY (ROUTINE TESTING W REFLEX): HIV Screen 4th Generation wRfx: NONREACTIVE

## 2023-11-23 MED ORDER — PNEUMOCOCCAL 20-VAL CONJ VACC 0.5 ML IM SUSY
0.5000 mL | PREFILLED_SYRINGE | INTRAMUSCULAR | Status: AC
  Administered 2023-11-25: 0.5 mL via INTRAMUSCULAR
  Filled 2023-11-23: qty 0.5

## 2023-11-23 MED ORDER — INFLUENZA VIRUS VACC SPLIT PF (FLUZONE) 0.5 ML IM SUSY
0.5000 mL | PREFILLED_SYRINGE | INTRAMUSCULAR | Status: AC
  Administered 2023-11-25: 0.5 mL via INTRAMUSCULAR
  Filled 2023-11-23: qty 0.5

## 2023-11-23 MED ORDER — SODIUM CHLORIDE 0.9 % IV SOLN
INTRAVENOUS | Status: DC
Start: 2023-11-23 — End: 2023-11-24

## 2023-11-23 NOTE — Consult Note (Addendum)
 Consultation  Referring Provider: Surgical team/Dr. Polly    Primary Care Physician:  Scarlett Ronal Caldron, NP Primary Gastroenterologist: [facility]     (digestive health) Reason for Consultation: Abnormal CT of the stomach             HPI:   Henry Kramer is a 53 y.o. male with a past medical history as listed below including reflux, chronic nausea and marijuana abuse as well as homelessness, who presented to the ER initially on 11/22/2023 with abdominal pain and nausea.    Today, the patient tells me he is followed with GI clinics over the past 10 years or so for chronic nausea which he was told was likely related to his marijuana abuse, he has tried to cut back before but is never completely quit this.  Describes that his nausea seems to come and go in waves and a hot shower usually helps but then it typically comes back.  It has never affected his eating though.  Most recently the day after Christmas 11/12/2023 he started with a new epigastric pain which she had never had before rated as a 10/10, tells me that even affected his breathing where he had to seconds slowly or it would make it hurt worse.  Due to this pain he came to the ED.  Associated symptoms include some dark/black stool over the past month which seems to be getting darker recently.  Admits to some Advil use when the pain started and some sort of medicine and a syringe.  Also tells us  he drinks 11 cans of red bull a day at least.  Found it hard to rest, laying on his left side made things worse.  Does admit to at least a 20 pound weight loss over the past couple of weeks and possibly 40 pounds over the past couple of months without really trying.    Tells me it has been recommended for him to have an EGD and colonoscopy in the past but he has never been able to follow through as he has never had a ride.    Denies fever, chills or symptoms that awaken him from sleep.  ED course: Afebrile, CT concerning for gastric wall  thickening with gas and fluid collection which could reflect contained perforation or gastric ulcer  Past Medical History:  Diagnosis Date   AKI (acute kidney injury) (HCC) 05/22/2014   Chronic nausea    GERD (gastroesophageal reflux disease)    Homelessness    Marijuana use    Substance abuse (HCC)    tobacco, marijuana, EtOH    Past Surgical History:  Procedure Laterality Date   NO PAST SURGERIES      Family History  Problem Relation Age of Onset   Cancer Mother    Alcohol abuse Father    Cirrhosis Father    Cancer Sister    Diabetes Brother    Obesity Brother     Social History   Tobacco Use   Smoking status: Every Day    Current packs/day: 0.50    Average packs/day: 0.5 packs/day for 22.0 years (11.0 ttl pk-yrs)    Types: Cigarettes   Smokeless tobacco: Never  Vaping Use   Vaping status: Never Used  Substance Use Topics   Alcohol use: Yes    Comment: occasional.     Drug use: Yes    Types: Marijuana    Comment: smokes THC 2-3 times per week.    Prior to Admission medications  Medication Sig Start Date End Date Taking? Authorizing Provider  albuterol  (VENTOLIN  HFA) 108 (90 Base) MCG/ACT inhaler Inhale 1-2 puffs into the lungs every 6 (six) hours as needed for wheezing or shortness of breath. 11/22/23  Yes Barrett, Jamie N, PA-C  amoxicillin -clavulanate (AUGMENTIN ) 875-125 MG tablet Take 1 tablet by mouth every 12 (twelve) hours. 11/22/23  Yes Barrett, Warren SAILOR, PA-C  azithromycin  (ZITHROMAX ) 250 MG tablet Take 1 tablet (250 mg total) by mouth daily. Take first 2 tablets together, then 1 every day until finished. 11/22/23  Yes Barrett, Warren SAILOR, PA-C  predniSONE  (DELTASONE ) 20 MG tablet Take 2 tablets (40 mg total) by mouth daily. 11/22/23  Yes Barrett, Jamie N, PA-C  Pseudoeph-Doxylamine-DM-APAP (NYQUIL PO) Take 2 tablets by mouth at bedtime as needed (cold symptoms).   Yes [provider]    Current Facility-Administered Medications  Medication Dose Route  Frequency Provider Last Rate Last Admin   acetaminophen  (TYLENOL ) tablet 650 mg  650 mg Oral Q6H PRN Opyd, Evalene RAMAN, MD       Or   acetaminophen  (TYLENOL ) suppository 650 mg  650 mg Rectal Q6H PRN Opyd, Evalene RAMAN, MD       fluconazole  (DIFLUCAN ) IVPB 400 mg  400 mg Intravenous Q24H Opyd, Evalene RAMAN, MD   Stopped at 11/23/23 0454   HYDROmorphone  (DILAUDID ) injection 0.5-1 mg  0.5-1 mg Intravenous Q3H PRN Opyd, Evalene RAMAN, MD       [START ON 11/24/2023] influenza vac split trivalent PF (FLULAVAL) injection 0.5 mL  0.5 mL Intramuscular Tomorrow-1000 Rosario Eland I, MD       ondansetron  (ZOFRAN ) tablet 4 mg  4 mg Oral Q6H PRN Opyd, Evalene RAMAN, MD       Or   ondansetron  (ZOFRAN ) injection 4 mg  4 mg Intravenous Q6H PRN Opyd, Evalene RAMAN, MD       pantoprazole  (PROTONIX ) injection 40 mg  40 mg Intravenous Q12H Opyd, Evalene RAMAN, MD   40 mg at 11/23/23 1144   piperacillin -tazobactam (ZOSYN ) IVPB 3.375 g  3.375 g Intravenous Q8H Opyd, Evalene RAMAN, MD 12.5 mL/hr at 11/23/23 0650 3.375 g at 11/23/23 0650   [START ON 11/24/2023] pneumococcal 20-valent conjugate vaccine (PREVNAR 20 ) injection 0.5 mL  0.5 mL Intramuscular Tomorrow-1000 Rosario Eland I, MD       sodium chloride  flush (NS) 0.9 % injection 3 mL  3 mL Intravenous Q12H Opyd, Evalene RAMAN, MD   3 mL at 11/23/23 1144    Allergies as of 11/22/2023   (No Known Allergies)     Review of Systems:    Constitutional: No fever or chills Skin: No rash Cardiovascular: No chest pain Respiratory:See HPI Gastrointestinal: See HPI and otherwise negative Genitourinary: No dysuria  Neurological: No headache, dizziness or syncope Musculoskeletal: No new muscle or joint pain Hematologic: No bleeding  Psychiatric: No history of depression or anxiety    Physical Exam:  Vital signs in last 24 hours: Temp:  [97.5 F (36.4 C)-98.1 F (36.7 C)] 97.7 F (36.5 C) (01/06 0928) Pulse Rate:  [54-104] 104 (01/06 0928) Resp:  [0-26] 16 (01/06 0928) BP:  (94-121)/(61-99) 116/77 (01/06 0928) SpO2:  [94 %-100 %] 94 % (01/06 0928) Last BM Date : 11/21/23 General:   Pleasant AA cachectic appearing male appears to be in NAD, Well developed, muscle wasting/sunken cheekbones, alert and cooperative Head:  Normocephalic and atraumatic. Eyes:   PEERL, EOMI. No icterus. Conjunctiva pink. Ears:  Normal auditory acuity. Neck:  Supple Throat: Oral cavity and pharynx without  inflammation, swelling or lesion. Teeth in good condition. Lungs: Respirations even and unlabored. Lungs clear to auscultation bilaterally.   No wheezes, crackles, or rhonchi.  Heart: Normal S1, S2. No MRG. Regular rate and rhythm. No peripheral edema, cyanosis or pallor.  Abdomen:  Soft, nondistended, moderate left upper quadrant/epigastric TTP with some involuntary guarding, normal bowel sounds. No appreciable masses or hepatomegaly. Rectal:  Not performed.  Msk:  Symmetrical without gross deformities. Peripheral pulses intact.  Extremities:  Without edema, no deformity or joint abnormality. Normal ROM, normal sensation. Neurologic:  Alert and  oriented x4;  grossly normal neurologically.  Skin:   Dry and intact without significant lesions or rashes. Psychiatric: Demonstrates good judgement and reason without abnormal affect or behaviors.  Some difficulty providing history   LAB RESULTS: Recent Labs    11/22/23 0933 11/23/23 0452  WBC 18.9* 8.6  HGB 11.8* 8.1*  HCT 34.4* 23.8*  PLT 813* 849*   BMET Recent Labs    11/22/23 0933 11/23/23 0452  NA 140 137  K 4.5 4.5  CL 101 101  CO2 27 22  GLUCOSE 95 127*  BUN 25* 24*  CREATININE 0.96 0.97  CALCIUM  8.9 8.4*      Latest Ref Rng & Units 11/23/2023    4:52 AM 11/22/2023    9:33 AM 03/31/2018    6:40 AM  Hepatic Function  Total Protein 6.5 - 8.1 g/dL 6.7  7.5  6.0   Albumin 3.5 - 5.0 g/dL 2.2  2.4  3.5   AST 15 - 41 U/L 53  54  14   ALT 0 - 44 U/L 87  91  15   Alk Phosphatase 38 - 126 U/L 134  136  47   Total  Bilirubin 0.0 - 1.2 mg/dL 0.4  0.3  0.6      STUDIES: CT ABDOMEN PELVIS W CONTRAST Result Date: 11/22/2023 CLINICAL DATA:  Duodenal ulcer evaluation for gastric perforation. EXAM: CT ABDOMEN AND PELVIS WITH CONTRAST TECHNIQUE: Multidetector CT imaging of the abdomen and pelvis was performed using the standard protocol following bolus administration of intravenous contrast. RADIATION DOSE REDUCTION: This exam was performed according to the departmental dose-optimization program which includes automated exposure control, adjustment of the mA and/or kV according to patient size and/or use of iterative reconstruction technique. CONTRAST:  50mL OMNIPAQUE  IOHEXOL  350 MG/ML SOLN COMPARISON:  CT 11/22/2023 at 2:19 p.m. FINDINGS: Lower chest: No acute abnormality. Hepatobiliary: Scattered hemangiomas. No acute abnormality. Normal gallbladder and biliary tree. Pancreas: Unremarkable. Spleen: Unremarkable. Adrenals/Urinary Tract: Normal adrenal glands. No urinary calculi or hydronephrosis. Contrast from prior CT within the ureters and bladder. Stomach/Bowel: Redemonstrated gastric wall thickening about the fundus. Gas and fluid collection with air-fluid level protruding from the gastric fundus measures 5.5 x 4.7 cm, grossly similar to earlier today. Enteric contrast is present within the stomach and small bowel. No discrete oral contrast is visualized within the gas and fluid collection however the overall density has increased compared to earlier today (Hounsfield units 38 versus 8 on the previous study). Soft tissue edema and stranding within the lesser sac. No bowel obstruction.  The appendix is not confidently visualized. Vascular/Lymphatic: No significant vascular findings are present. No enlarged abdominal or pelvic lymph nodes. Reproductive: Enlarged prostate. Other: No free intraperitoneal air. Musculoskeletal: No acute fracture. IMPRESSION: Findings remain concerning for gastric ulceration with contained  perforation. No discrete oral contrast is visualized within the gas and fluid collection however the overall density has increased compared to earlier today which  may be due to dilute oral contrast within the collection. Primary differential consideration is gastric diverticulitis. Electronically Signed   By: Norman Gatlin M.D.   On: 11/22/2023 21:25   CT ABDOMEN PELVIS W CONTRAST Result Date: 11/22/2023 CLINICAL DATA:  Left flank pain. EXAM: CT ABDOMEN AND PELVIS WITH CONTRAST TECHNIQUE: Multidetector CT imaging of the abdomen and pelvis was performed using the standard protocol following bolus administration of intravenous contrast. RADIATION DOSE REDUCTION: This exam was performed according to the departmental dose-optimization program which includes automated exposure control, adjustment of the mA and/or kV according to patient size and/or use of iterative reconstruction technique. CONTRAST:  75mL OMNIPAQUE  IOHEXOL  350 MG/ML SOLN COMPARISON:  MRI 08/11/2016 FINDINGS: Lower chest: No pleural effusion or consolidative change identified. Hepatobiliary: Previously characterized liver hemangiomas are again identified within the anterior right lobe and far lateral left lobe. The left lobe of liver hemangioma measures 2.8 cm, image 1.6/3. Anterior right hepatic lobe hemangioma measures 1.1 cm, image 119/3. Gallbladder appears normal. No bile duct dilatation Pancreas: Unremarkable. No pancreatic ductal dilatation or surrounding inflammatory changes. Spleen: Normal in size without focal abnormality. Adrenals/Urinary Tract: Normal adrenal glands. No nephrolithiasis, hydronephrosis or suspicious mass. Urinary bladder appears normal. Stomach/Bowel: Assessment of bowel pathology is diminished due to lack of enteric contrast material. There is abnormal wall thickening involving the gastric fundus there is a gas and fluid collection between the proximal stomach measuring 6.1 x 1.4 by 4.1 cm. There is an air-fluid level  within this fluid collection which may communicate with the lumen of the stomach, image 20/3. This area is difficult to scratch edema and soft tissue stranding is noted within the lesser sac, there is diffuse edema identified within the lesser sac. The small bowel loops appear nondilated. Scattered mildly dilated loops of small bowel are identified which measure up to 2.2 cm. Lack of enteric contrast material and paucity of visceral fat limits assessment for the appendix which is not confidently identified. Vascular/Lymphatic: Normal caliber of the abdominal aorta. No adenopathy identified. Reproductive: Prostate gland enlargement. Other: No significant free fluid identified. No signs of free perforation. Musculoskeletal: No acute or significant osseous findings. IMPRESSION: 1. There is abnormal wall thickening involving the gastric fundus. There is a gas and fluid collection between the proximal stomach measuring 6.1 x 1.4 x 4.1 cm. There is an air-fluid level within this fluid collection which may communicate with the lumen of the stomach. Findings are concerning for gastric ulceration with contained perforation. Consider further evaluation with repeat CT of the abdomen following oral contrast and IV contrast administration. 2. Scattered mildly dilated loops of small bowel are identified which measure up to 2.2 cm. Findings are favored to represent ileus. 3. Lack of enteric contrast material and paucity of visceral fat limits assessment for the appendix which is not confidently identified. 4. Prostate gland enlargement. 5. Previously characterized liver hemangiomas are again identified. Electronically Signed   By: Waddell Calk M.D.   On: 11/22/2023 15:11   CT Angio Chest PE W and/or Wo Contrast Result Date: 11/22/2023 CLINICAL DATA:  Shortness of breath for 1 week. Rule out pulmonary embolus. EXAM: CT ANGIOGRAPHY CHEST WITH CONTRAST TECHNIQUE: Multidetector CT imaging of the chest was performed using the  standard protocol during bolus administration of intravenous contrast. Multiplanar CT image reconstructions and MIPs were obtained to evaluate the vascular anatomy. RADIATION DOSE REDUCTION: This exam was performed according to the departmental dose-optimization program which includes automated exposure control, adjustment of the mA and/or kV according  to patient size and/or use of iterative reconstruction technique. CONTRAST:  75mL OMNIPAQUE  IOHEXOL  350 MG/ML SOLN COMPARISON:  None Available. FINDINGS: Cardiovascular: Satisfactory opacification of the pulmonary arteries to the segmental level. No evidence of pulmonary embolism. Normal heart size. No pericardial effusion. Mediastinum/Nodes: No enlarged mediastinal, hilar, or axillary lymph nodes. Thyroid  gland, trachea, and esophagus demonstrate no significant findings. Lungs/Pleura: Bilateral increased thickening of the peribronchovascular interstitium. Signs of mucous plugging identified within the lower lobe airways bilaterally. No signs of pleural effusion, airspace consolidation, atelectasis or pneumothorax. No suspicious pulmonary nodule or mass identified bilaterally. Upper Abdomen: No acute abnormality. See report for abdomen and pelvis CT also performed today. Musculoskeletal: No chest wall abnormality. No acute or significant osseous findings. Review of the MIP images confirms the above findings. IMPRESSION: 1. No evidence for acute pulmonary embolus. 2. Bilateral increased thickening of the peribronchovascular interstitium. Signs of mucous plugging identified within the lower lobe airways bilaterally. Findings are nonspecific but may reflect bronchitis. Electronically Signed   By: Waddell Calk M.D.   On: 11/22/2023 14:56   DG Chest 2 View Result Date: 11/22/2023 CLINICAL DATA:  53 year old male with history of shortness of breath. EXAM: CHEST - 2 VIEW COMPARISON:  Chest x-ray 03/08/2008. FINDINGS: Lung volumes are normal. No consolidative airspace  disease. No pleural effusions. No pneumothorax. No pulmonary nodule or mass noted. Pulmonary vasculature and the cardiomediastinal silhouette are within normal limits. IMPRESSION: No radiographic evidence of acute cardiopulmonary disease. Electronically Signed   By: Toribio Aye M.D.   On: 11/22/2023 10:02     Impression / Plan:   Impression: 1.  Abnormal CT of the stomach: With question of perforation contained +/- malignancy versus other 2.  Epigastric pain: Related to above 3.  Marijuana abuse 4.  Chronic nausea: Thought related to above +/- this new finding  Plan: 1.  Plan for EGD tomorrow with Dr. Deylan Canterbury.  Did discuss risks, benefits, limitations and alternatives and patient agrees to proceed.  He does know he is higher risk than normal given the possibility of contained perforation on CT.  Discussed that if he were to perforate during EGD he would be sent emergently for surgery.  He verbalized understanding and wants to proceed. 2.  Patient asked that we discussed plans with his mother at [phone] ([place])-I attempted to call her with no answer 3.  Patient to continue n.p.o. for now 4.  Continue to monitor labs 5.  Patient will need colonoscopy outpatient for colorectal cancer screening 6.  Agree with IV PPI 40mg  BID  Thank you for your kind consultation, we will continue to follow.  Delon Gibson [facility]  11/23/2023, 12:34 PM   Attending physician's note  I have taken a history, reviewed the chart and examined the patient. I performed a substantive portion of this encounter, including complete performance of at least one of the key components, in conjunction with the APP. I agree with the APP's note, impression and recommendations.   53 year old male admitted with severe abdominal pain and nausea, abnormal CT concerning for contained gastric perforation, just gastric malignancy in the differential Patient denies any cocaine use History of marijuana use and NSAID use He has  had melanotic stool for past 1 to 2 months and has lost over 40 pounds within the past few weeks, patient is a poor historian and is not very clear with timeline  Plan to proceed with EGD tomorrow for further evaluation, discussed risks in detail including bleeding perforation, infection and need for emergent surgery  if he develops perforation during procedure N.p.o. PPI IV twice daily  Further recommendations based on EGD findings   The patient was provided an opportunity to ask questions and all were answered. The patient agreed with the plan and demonstrated an understanding of the instructions.  LOIS Wilkie Mcgee , MD [phone]

## 2023-11-23 NOTE — TOC Initial Note (Signed)
 Transition of Care [facility]) - Initial/Assessment Note    Patient Details  Name: Henry Kramer MRN: [id number] Date of Birth: [birth year]  [facility]) CM/SW Contact:    Sudie Erminio Deems, RN Phone Number: 11/23/2023, 12:28 PM  Clinical Narrative: Patient presented for abdominal pain and nausea. PTA patient reports that he lived alone in an apartment via the [employer] for Housing. Patient is concerned that he may get evicted because he has not found a job per [employer]. Patient states his mother is reaching out to [employer]. Patient is without PCP and insurance at this time. Patient is agreeable to Medicaid Screen-information provided to the CSW to have [facility] screen for Medicaid. Patient is open to Case Manager arranging a hospital follow up appointment with the [facility]. Information placed on the AVS. Patient reports that he can ride his bicycle to the appointment. Case Manager will follow for [facility] assistance for medications. Case Manager will continue to follow for additional needs as the patient progresses.                      Expected Discharge Plan: Home/Self Care Barriers to Discharge: No Barriers Identified   Patient Goals and CMS Choice Patient states their goals for this hospitalization and ongoing recovery are:: to feel better and return home.    Expected Discharge Plan and Services In-house Referral: Financial Counselor, Clinical Social Work Discharge Planning Services: CM Consult, Follow-up appt scheduled, Medication Assistance, MATCH Program   Living arrangements for the past 2 months: Apartment                 DME Arranged: N/A  Prior Living Arrangements/Services Living arrangements for the past 2 months: Apartment Lives with:: Self Patient language and need for interpreter reviewed:: Yes Do you feel safe going back to the place where you live?: Yes      Need for Family Participation in Patient  Care: Yes (Comment) Care giver support system in place?: Yes (comment)   Criminal Activity/Legal Involvement Pertinent to Current Situation/Hospitalization: No - Comment as needed  Activities of Daily Living   ADL Screening (condition at time of admission) Independently performs ADLs?: Yes (appropriate for developmental age) Is the patient deaf or have difficulty hearing?: No Does the patient have difficulty seeing, even when wearing glasses/contacts?: No Does the patient have difficulty concentrating, remembering, or making decisions?: No  Permission Sought/Granted Permission sought to share information with : Family Supports Permission granted to share information with : Yes, Verbal Permission Granted     Permission granted to share info w AGENCY: [facility]        Emotional Assessment Appearance:: Appears stated age Attitude/Demeanor/Rapport: Engaged Affect (typically observed): Appropriate Orientation: : Oriented to Self, Oriented to Place, Oriented to  Time, Oriented to Situation Alcohol / Substance Use: Not Applicable Psych Involvement: No (comment)  Admission diagnosis:  Gastric ulcer with perforation (HCC) [K25.5] Bronchitis [J40] Gastric ulcer with perforation, unspecified chronicity (HCC) [K25.5] Patient Active Problem List   Diagnosis Date Noted   Gastric ulcer with perforation (HCC) 11/22/2023   Elevated troponin 03/08/2018   Acute renal failure (ARF) (HCC) 03/08/2018   Depression 03/08/2018   Dehydration 03/08/2018   ARF (acute renal failure) (HCC) 09/29/2017   Metabolic acidosis 09/29/2017   Marijuana abuse 09/29/2017   Hyperkalemia 09/29/2017   Benign prostatic hyperplasia with nocturia 09/06/2016   Healthcare maintenance 09/06/2016   Hematuria 07/30/2016   Abnormal CT of liver 07/30/2016  Protein-calorie malnutrition, severe 10/29/2015   AKI (acute kidney injury) (HCC) 05/22/2014   Chronic nausea 04/21/2014   Smoking 04/21/2014   History of  substance abuse (HCC) 04/21/2014   PCP:  Scarlett Ronal Caldron, NP Pharmacy:   [facility] [phone] - [place], [place] - [address] AT [facility] [address] [place] [address] Phone: [phone] Fax: [phone]  Social Drivers of Health (SDOH) Social History: SDOH Screenings   Food Insecurity: Food Insecurity Present (11/23/2023)  Housing: Low Risk  (11/23/2023)  Transportation Needs: Unmet Transportation Needs (11/23/2023)  Utilities: At Risk (11/23/2023)  Tobacco Use: High Risk (11/22/2023)   SDOH Interventions:     Readmission Risk Interventions     No data to display

## 2023-11-23 NOTE — H&P (View-Only) (Signed)
 Consultation  Referring Provider: Surgical team/Dr. Polly    Primary Care Physician:  Scarlett Ronal Caldron, NP Primary Gastroenterologist: [facility]     (digestive health) Reason for Consultation: Abnormal CT of the stomach             HPI:   Henry Kramer is a 53 y.o. male with a past medical history as listed below including reflux, chronic nausea and marijuana abuse as well as homelessness, who presented to the ER initially on 11/22/2023 with abdominal pain and nausea.    Today, the patient tells me he is followed with GI clinics over the past 10 years or so for chronic nausea which he was told was likely related to his marijuana abuse, he has tried to cut back before but is never completely quit this.  Describes that his nausea seems to come and go in waves and a hot shower usually helps but then it typically comes back.  It has never affected his eating though.  Most recently the day after Christmas 11/12/2023 he started with a new epigastric pain which she had never had before rated as a 10/10, tells me that even affected his breathing where he had to seconds slowly or it would make it hurt worse.  Due to this pain he came to the ED.  Associated symptoms include some dark/black stool over the past month which seems to be getting darker recently.  Admits to some Advil use when the pain started and some sort of medicine and a syringe.  Also tells us  he drinks 11 cans of red bull a day at least.  Found it hard to rest, laying on his left side made things worse.  Does admit to at least a 20 pound weight loss over the past couple of weeks and possibly 40 pounds over the past couple of months without really trying.    Tells me it has been recommended for him to have an EGD and colonoscopy in the past but he has never been able to follow through as he has never had a ride.    Denies fever, chills or symptoms that awaken him from sleep.  ED course: Afebrile, CT concerning for gastric wall  thickening with gas and fluid collection which could reflect contained perforation or gastric ulcer  Past Medical History:  Diagnosis Date   AKI (acute kidney injury) (HCC) 05/22/2014   Chronic nausea    GERD (gastroesophageal reflux disease)    Homelessness    Marijuana use    Substance abuse (HCC)    tobacco, marijuana, EtOH    Past Surgical History:  Procedure Laterality Date   NO PAST SURGERIES      Family History  Problem Relation Age of Onset   Cancer Mother    Alcohol abuse Father    Cirrhosis Father    Cancer Sister    Diabetes Brother    Obesity Brother     Social History   Tobacco Use   Smoking status: Every Day    Current packs/day: 0.50    Average packs/day: 0.5 packs/day for 22.0 years (11.0 ttl pk-yrs)    Types: Cigarettes   Smokeless tobacco: Never  Vaping Use   Vaping status: Never Used  Substance Use Topics   Alcohol use: Yes    Comment: occasional.     Drug use: Yes    Types: Marijuana    Comment: smokes THC 2-3 times per week.    Prior to Admission medications  Medication Sig Start Date End Date Taking? Authorizing Provider  albuterol  (VENTOLIN  HFA) 108 (90 Base) MCG/ACT inhaler Inhale 1-2 puffs into the lungs every 6 (six) hours as needed for wheezing or shortness of breath. 11/22/23  Yes Barrett, Jamie N, PA-C  amoxicillin -clavulanate (AUGMENTIN ) 875-125 MG tablet Take 1 tablet by mouth every 12 (twelve) hours. 11/22/23  Yes Barrett, Warren SAILOR, PA-C  azithromycin  (ZITHROMAX ) 250 MG tablet Take 1 tablet (250 mg total) by mouth daily. Take first 2 tablets together, then 1 every day until finished. 11/22/23  Yes Barrett, Warren SAILOR, PA-C  predniSONE  (DELTASONE ) 20 MG tablet Take 2 tablets (40 mg total) by mouth daily. 11/22/23  Yes Barrett, Jamie N, PA-C  Pseudoeph-Doxylamine-DM-APAP (NYQUIL PO) Take 2 tablets by mouth at bedtime as needed (cold symptoms).   Yes [provider]    Current Facility-Administered Medications  Medication Dose Route  Frequency Provider Last Rate Last Admin   acetaminophen  (TYLENOL ) tablet 650 mg  650 mg Oral Q6H PRN Opyd, Evalene RAMAN, MD       Or   acetaminophen  (TYLENOL ) suppository 650 mg  650 mg Rectal Q6H PRN Opyd, Evalene RAMAN, MD       fluconazole  (DIFLUCAN ) IVPB 400 mg  400 mg Intravenous Q24H Opyd, Evalene RAMAN, MD   Stopped at 11/23/23 0454   HYDROmorphone  (DILAUDID ) injection 0.5-1 mg  0.5-1 mg Intravenous Q3H PRN Opyd, Evalene RAMAN, MD       [START ON 11/24/2023] influenza vac split trivalent PF (FLULAVAL) injection 0.5 mL  0.5 mL Intramuscular Tomorrow-1000 Rosario Eland I, MD       ondansetron  (ZOFRAN ) tablet 4 mg  4 mg Oral Q6H PRN Opyd, Evalene RAMAN, MD       Or   ondansetron  (ZOFRAN ) injection 4 mg  4 mg Intravenous Q6H PRN Opyd, Evalene RAMAN, MD       pantoprazole  (PROTONIX ) injection 40 mg  40 mg Intravenous Q12H Opyd, Evalene RAMAN, MD   40 mg at 11/23/23 1144   piperacillin -tazobactam (ZOSYN ) IVPB 3.375 g  3.375 g Intravenous Q8H Opyd, Evalene RAMAN, MD 12.5 mL/hr at 11/23/23 0650 3.375 g at 11/23/23 0650   [START ON 11/24/2023] pneumococcal 20-valent conjugate vaccine (PREVNAR 20 ) injection 0.5 mL  0.5 mL Intramuscular Tomorrow-1000 Rosario Eland I, MD       sodium chloride  flush (NS) 0.9 % injection 3 mL  3 mL Intravenous Q12H Opyd, Evalene RAMAN, MD   3 mL at 11/23/23 1144    Allergies as of 11/22/2023   (No Known Allergies)     Review of Systems:    Constitutional: No fever or chills Skin: No rash Cardiovascular: No chest pain Respiratory:See HPI Gastrointestinal: See HPI and otherwise negative Genitourinary: No dysuria  Neurological: No headache, dizziness or syncope Musculoskeletal: No new muscle or joint pain Hematologic: No bleeding  Psychiatric: No history of depression or anxiety    Physical Exam:  Vital signs in last 24 hours: Temp:  [97.5 F (36.4 C)-98.1 F (36.7 C)] 97.7 F (36.5 C) (01/06 0928) Pulse Rate:  [54-104] 104 (01/06 0928) Resp:  [0-26] 16 (01/06 0928) BP:  (94-121)/(61-99) 116/77 (01/06 0928) SpO2:  [94 %-100 %] 94 % (01/06 0928) Last BM Date : 11/21/23 General:   Pleasant AA cachectic appearing male appears to be in NAD, Well developed, muscle wasting/sunken cheekbones, alert and cooperative Head:  Normocephalic and atraumatic. Eyes:   PEERL, EOMI. No icterus. Conjunctiva pink. Ears:  Normal auditory acuity. Neck:  Supple Throat: Oral cavity and pharynx without  inflammation, swelling or lesion. Teeth in good condition. Lungs: Respirations even and unlabored. Lungs clear to auscultation bilaterally.   No wheezes, crackles, or rhonchi.  Heart: Normal S1, S2. No MRG. Regular rate and rhythm. No peripheral edema, cyanosis or pallor.  Abdomen:  Soft, nondistended, moderate left upper quadrant/epigastric TTP with some involuntary guarding, normal bowel sounds. No appreciable masses or hepatomegaly. Rectal:  Not performed.  Msk:  Symmetrical without gross deformities. Peripheral pulses intact.  Extremities:  Without edema, no deformity or joint abnormality. Normal ROM, normal sensation. Neurologic:  Alert and  oriented x4;  grossly normal neurologically.  Skin:   Dry and intact without significant lesions or rashes. Psychiatric: Demonstrates good judgement and reason without abnormal affect or behaviors.  Some difficulty providing history   LAB RESULTS: Recent Labs    11/22/23 0933 11/23/23 0452  WBC 18.9* 8.6  HGB 11.8* 8.1*  HCT 34.4* 23.8*  PLT 813* 849*   BMET Recent Labs    11/22/23 0933 11/23/23 0452  NA 140 137  K 4.5 4.5  CL 101 101  CO2 27 22  GLUCOSE 95 127*  BUN 25* 24*  CREATININE 0.96 0.97  CALCIUM  8.9 8.4*      Latest Ref Rng & Units 11/23/2023    4:52 AM 11/22/2023    9:33 AM 03/31/2018    6:40 AM  Hepatic Function  Total Protein 6.5 - 8.1 g/dL 6.7  7.5  6.0   Albumin 3.5 - 5.0 g/dL 2.2  2.4  3.5   AST 15 - 41 U/L 53  54  14   ALT 0 - 44 U/L 87  91  15   Alk Phosphatase 38 - 126 U/L 134  136  47   Total  Bilirubin 0.0 - 1.2 mg/dL 0.4  0.3  0.6      STUDIES: CT ABDOMEN PELVIS W CONTRAST Result Date: 11/22/2023 CLINICAL DATA:  Duodenal ulcer evaluation for gastric perforation. EXAM: CT ABDOMEN AND PELVIS WITH CONTRAST TECHNIQUE: Multidetector CT imaging of the abdomen and pelvis was performed using the standard protocol following bolus administration of intravenous contrast. RADIATION DOSE REDUCTION: This exam was performed according to the departmental dose-optimization program which includes automated exposure control, adjustment of the mA and/or kV according to patient size and/or use of iterative reconstruction technique. CONTRAST:  50mL OMNIPAQUE  IOHEXOL  350 MG/ML SOLN COMPARISON:  CT 11/22/2023 at 2:19 p.m. FINDINGS: Lower chest: No acute abnormality. Hepatobiliary: Scattered hemangiomas. No acute abnormality. Normal gallbladder and biliary tree. Pancreas: Unremarkable. Spleen: Unremarkable. Adrenals/Urinary Tract: Normal adrenal glands. No urinary calculi or hydronephrosis. Contrast from prior CT within the ureters and bladder. Stomach/Bowel: Redemonstrated gastric wall thickening about the fundus. Gas and fluid collection with air-fluid level protruding from the gastric fundus measures 5.5 x 4.7 cm, grossly similar to earlier today. Enteric contrast is present within the stomach and small bowel. No discrete oral contrast is visualized within the gas and fluid collection however the overall density has increased compared to earlier today (Hounsfield units 38 versus 8 on the previous study). Soft tissue edema and stranding within the lesser sac. No bowel obstruction.  The appendix is not confidently visualized. Vascular/Lymphatic: No significant vascular findings are present. No enlarged abdominal or pelvic lymph nodes. Reproductive: Enlarged prostate. Other: No free intraperitoneal air. Musculoskeletal: No acute fracture. IMPRESSION: Findings remain concerning for gastric ulceration with contained  perforation. No discrete oral contrast is visualized within the gas and fluid collection however the overall density has increased compared to earlier today which  may be due to dilute oral contrast within the collection. Primary differential consideration is gastric diverticulitis. Electronically Signed   By: Norman Gatlin M.D.   On: 11/22/2023 21:25   CT ABDOMEN PELVIS W CONTRAST Result Date: 11/22/2023 CLINICAL DATA:  Left flank pain. EXAM: CT ABDOMEN AND PELVIS WITH CONTRAST TECHNIQUE: Multidetector CT imaging of the abdomen and pelvis was performed using the standard protocol following bolus administration of intravenous contrast. RADIATION DOSE REDUCTION: This exam was performed according to the departmental dose-optimization program which includes automated exposure control, adjustment of the mA and/or kV according to patient size and/or use of iterative reconstruction technique. CONTRAST:  75mL OMNIPAQUE  IOHEXOL  350 MG/ML SOLN COMPARISON:  MRI 08/11/2016 FINDINGS: Lower chest: No pleural effusion or consolidative change identified. Hepatobiliary: Previously characterized liver hemangiomas are again identified within the anterior right lobe and far lateral left lobe. The left lobe of liver hemangioma measures 2.8 cm, image 1.6/3. Anterior right hepatic lobe hemangioma measures 1.1 cm, image 119/3. Gallbladder appears normal. No bile duct dilatation Pancreas: Unremarkable. No pancreatic ductal dilatation or surrounding inflammatory changes. Spleen: Normal in size without focal abnormality. Adrenals/Urinary Tract: Normal adrenal glands. No nephrolithiasis, hydronephrosis or suspicious mass. Urinary bladder appears normal. Stomach/Bowel: Assessment of bowel pathology is diminished due to lack of enteric contrast material. There is abnormal wall thickening involving the gastric fundus there is a gas and fluid collection between the proximal stomach measuring 6.1 x 1.4 by 4.1 cm. There is an air-fluid level  within this fluid collection which may communicate with the lumen of the stomach, image 20/3. This area is difficult to scratch edema and soft tissue stranding is noted within the lesser sac, there is diffuse edema identified within the lesser sac. The small bowel loops appear nondilated. Scattered mildly dilated loops of small bowel are identified which measure up to 2.2 cm. Lack of enteric contrast material and paucity of visceral fat limits assessment for the appendix which is not confidently identified. Vascular/Lymphatic: Normal caliber of the abdominal aorta. No adenopathy identified. Reproductive: Prostate gland enlargement. Other: No significant free fluid identified. No signs of free perforation. Musculoskeletal: No acute or significant osseous findings. IMPRESSION: 1. There is abnormal wall thickening involving the gastric fundus. There is a gas and fluid collection between the proximal stomach measuring 6.1 x 1.4 x 4.1 cm. There is an air-fluid level within this fluid collection which may communicate with the lumen of the stomach. Findings are concerning for gastric ulceration with contained perforation. Consider further evaluation with repeat CT of the abdomen following oral contrast and IV contrast administration. 2. Scattered mildly dilated loops of small bowel are identified which measure up to 2.2 cm. Findings are favored to represent ileus. 3. Lack of enteric contrast material and paucity of visceral fat limits assessment for the appendix which is not confidently identified. 4. Prostate gland enlargement. 5. Previously characterized liver hemangiomas are again identified. Electronically Signed   By: Waddell Calk M.D.   On: 11/22/2023 15:11   CT Angio Chest PE W and/or Wo Contrast Result Date: 11/22/2023 CLINICAL DATA:  Shortness of breath for 1 week. Rule out pulmonary embolus. EXAM: CT ANGIOGRAPHY CHEST WITH CONTRAST TECHNIQUE: Multidetector CT imaging of the chest was performed using the  standard protocol during bolus administration of intravenous contrast. Multiplanar CT image reconstructions and MIPs were obtained to evaluate the vascular anatomy. RADIATION DOSE REDUCTION: This exam was performed according to the departmental dose-optimization program which includes automated exposure control, adjustment of the mA and/or kV according  to patient size and/or use of iterative reconstruction technique. CONTRAST:  75mL OMNIPAQUE  IOHEXOL  350 MG/ML SOLN COMPARISON:  None Available. FINDINGS: Cardiovascular: Satisfactory opacification of the pulmonary arteries to the segmental level. No evidence of pulmonary embolism. Normal heart size. No pericardial effusion. Mediastinum/Nodes: No enlarged mediastinal, hilar, or axillary lymph nodes. Thyroid  gland, trachea, and esophagus demonstrate no significant findings. Lungs/Pleura: Bilateral increased thickening of the peribronchovascular interstitium. Signs of mucous plugging identified within the lower lobe airways bilaterally. No signs of pleural effusion, airspace consolidation, atelectasis or pneumothorax. No suspicious pulmonary nodule or mass identified bilaterally. Upper Abdomen: No acute abnormality. See report for abdomen and pelvis CT also performed today. Musculoskeletal: No chest wall abnormality. No acute or significant osseous findings. Review of the MIP images confirms the above findings. IMPRESSION: 1. No evidence for acute pulmonary embolus. 2. Bilateral increased thickening of the peribronchovascular interstitium. Signs of mucous plugging identified within the lower lobe airways bilaterally. Findings are nonspecific but may reflect bronchitis. Electronically Signed   By: Waddell Calk M.D.   On: 11/22/2023 14:56   DG Chest 2 View Result Date: 11/22/2023 CLINICAL DATA:  53 year old male with history of shortness of breath. EXAM: CHEST - 2 VIEW COMPARISON:  Chest x-ray 03/08/2008. FINDINGS: Lung volumes are normal. No consolidative airspace  disease. No pleural effusions. No pneumothorax. No pulmonary nodule or mass noted. Pulmonary vasculature and the cardiomediastinal silhouette are within normal limits. IMPRESSION: No radiographic evidence of acute cardiopulmonary disease. Electronically Signed   By: Toribio Aye M.D.   On: 11/22/2023 10:02     Impression / Plan:   Impression: 1.  Abnormal CT of the stomach: With question of perforation contained +/- malignancy versus other 2.  Epigastric pain: Related to above 3.  Marijuana abuse 4.  Chronic nausea: Thought related to above +/- this new finding  Plan: 1.  Plan for EGD tomorrow with Dr. Deylan Canterbury.  Did discuss risks, benefits, limitations and alternatives and patient agrees to proceed.  He does know he is higher risk than normal given the possibility of contained perforation on CT.  Discussed that if he were to perforate during EGD he would be sent emergently for surgery.  He verbalized understanding and wants to proceed. 2.  Patient asked that we discussed plans with his mother at [phone] ([place])-I attempted to call her with no answer 3.  Patient to continue n.p.o. for now 4.  Continue to monitor labs 5.  Patient will need colonoscopy outpatient for colorectal cancer screening 6.  Agree with IV PPI 40mg  BID  Thank you for your kind consultation, we will continue to follow.  Delon Gibson [facility]  11/23/2023, 12:34 PM   Attending physician's note  I have taken a history, reviewed the chart and examined the patient. I performed a substantive portion of this encounter, including complete performance of at least one of the key components, in conjunction with the APP. I agree with the APP's note, impression and recommendations.   53 year old male admitted with severe abdominal pain and nausea, abnormal CT concerning for contained gastric perforation, just gastric malignancy in the differential Patient denies any cocaine use History of marijuana use and NSAID use He has  had melanotic stool for past 1 to 2 months and has lost over 40 pounds within the past few weeks, patient is a poor historian and is not very clear with timeline  Plan to proceed with EGD tomorrow for further evaluation, discussed risks in detail including bleeding perforation, infection and need for emergent surgery  if he develops perforation during procedure N.p.o. PPI IV twice daily  Further recommendations based on EGD findings   The patient was provided an opportunity to ask questions and all were answered. The patient agreed with the plan and demonstrated an understanding of the instructions.  LOIS Wilkie Mcgee , MD [phone]

## 2023-11-23 NOTE — ED Notes (Signed)
 ED TO INPATIENT HANDOFF REPORT  ED Nurse Name and Phone #: chris rn [phone]  S Name/Age/Gender Henry Kramer 53 y.o. male Room/Bed: 045C/045C  Code Status   Code Status: Full Code  Home/SNF/Other Home Patient oriented to: self, place, time, and situation Is this baseline? Yes   Triage Complete: Triage complete  Chief Complaint Gastric ulcer with perforation (HCC) [K25.5]  Triage Note Pt c/o L flank pain ongoing for over a week that has been causing SOB. Pt denies urinary symptoms, N/V.    Allergies No Known Allergies  Level of Care/Admitting Diagnosis ED Disposition     ED Disposition  Admit   Condition  --   Comment  Hospital Area: [facility] [[id number]]  Level of Care: Progressive [102]  Admit to Progressive based on following criteria: MULTISYSTEM THREATS such as stable sepsis, metabolic/electrolyte imbalance with or without encephalopathy that is responding to early treatment.  May admit patient to [facility] or [facility] if equivalent level of care is available:: Yes  Covid Evaluation: Asymptomatic - no recent exposure (last 10 days) testing not required  Diagnosis: Gastric ulcer with perforation [facility]) [829033]  Admitting Physician: CHARLTON EVALENE RAMAN [[id number]]  Attending Physician: CHARLTON EVALENE RAMAN [[id number]]  Certification:: I certify this patient will need inpatient services for at least 2 midnights  Expected Medical Readiness: 11/25/2023          B Medical/Surgery History Past Medical History:  Diagnosis Date   AKI (acute kidney injury) (HCC) 05/22/2014   Chronic nausea    GERD (gastroesophageal reflux disease)    Homelessness    Marijuana use    Substance abuse (HCC)    tobacco, marijuana, EtOH   Past Surgical History:  Procedure Laterality Date   NO PAST SURGERIES       A IV Location/Drains/Wounds Patient Lines/Drains/Airways Status     Active Line/Drains/Airways     Name Placement date Placement time Site Days    Peripheral IV 11/22/23 20 G Left Antecubital 11/22/23  1343  Antecubital  1            Intake/Output Last 24 hours No intake or output data in the 24 hours ending 11/23/23 0015  Labs/Imaging Results for orders placed or performed during the hospital encounter of 11/22/23 (from the past 48 hours)  Urinalysis, Routine w reflex microscopic -Urine, Clean Catch     Status: None   Collection Time: 11/22/23  9:31 AM  Result Value Ref Range   Color, Urine YELLOW YELLOW   APPearance CLEAR CLEAR   Specific Gravity, Urine 1.015 1.005 - 1.030   pH 5.0 5.0 - 8.0   Glucose, UA NEGATIVE NEGATIVE mg/dL   Hgb urine dipstick NEGATIVE NEGATIVE   Bilirubin Urine NEGATIVE NEGATIVE   Ketones, ur NEGATIVE NEGATIVE mg/dL   Protein, ur NEGATIVE NEGATIVE mg/dL   Nitrite NEGATIVE NEGATIVE   Leukocytes,Ua NEGATIVE NEGATIVE    Comment: Performed at [facility], [address] [place], [place] [address]  Lipase, blood     Status: None   Collection Time: 11/22/23  9:33 AM  Result Value Ref Range   Lipase 47 11 - 51 U/L    Comment: Performed at [facility], [address], [place], [place] [address]  Comprehensive metabolic panel     Status: Abnormal   Collection Time: 11/22/23  9:33 AM  Result Value Ref Range   Sodium 140 135 - 145 mmol/L   Potassium 4.5 3.5 - 5.1 mmol/L  Chloride 101 98 - 111 mmol/L   CO2 27 22 - 32 mmol/L   Glucose, Bld 95 70 - 99 mg/dL    Comment: Glucose reference range applies only to samples taken after fasting for at least 8 hours.   BUN 25 (H) 6 - 20 mg/dL   Creatinine, Ser 9.03 0.61 - 1.24 mg/dL   Calcium  8.9 8.9 - 10.3 mg/dL   Total Protein 7.5 6.5 - 8.1 g/dL   Albumin 2.4 (L) 3.5 - 5.0 g/dL   AST 54 (H) 15 - 41 U/L   ALT 91 (H) 0 - 44 U/L   Alkaline Phosphatase 136 (H) 38 - 126 U/L   Total Bilirubin 0.3 0.0 - 1.2 mg/dL   GFR, Estimated >39 >39 mL/min    Comment: (NOTE) Calculated using the CKD-EPI Creatinine Equation (2021)    Anion gap 12 5 - 15     Comment: Performed at [facility], [address] [place], [place] [address]  CBC     Status: Abnormal   Collection Time: 11/22/23  9:33 AM  Result Value Ref Range   WBC 18.9 (H) 4.0 - 10.5 K/uL   RBC 4.40 4.22 - 5.81 MIL/uL   Hemoglobin 11.8 (L) 13.0 - 17.0 g/dL   HCT 65.5 (L) 60.9 - 47.9 %   MCV 78.2 (L) 80.0 - 100.0 fL   MCH 26.8 26.0 - 34.0 pg   MCHC 34.3 30.0 - 36.0 g/dL   RDW 84.8 88.4 - 84.4 %   Platelets 813 (H) 150 - 400 K/uL   nRBC 0.0 0.0 - 0.2 %    Comment: Performed at [facility], [address] [place], [place] [address]  Troponin I (High Sensitivity)     Status: None   Collection Time: 11/22/23  2:43 PM  Result Value Ref Range   Troponin I (High Sensitivity) 12 <18 ng/L    Comment: (NOTE) Elevated high sensitivity troponin I (hsTnI) values and significant  changes across serial measurements may suggest ACS but many other  chronic and acute conditions are known to elevate hsTnI results.  Refer to the Links section for chest pain algorithms and additional  guidance. Performed at [facility], [address], [place], [place] [address]    CT ABDOMEN PELVIS W CONTRAST Result Date: 11/22/2023 CLINICAL DATA:  Duodenal ulcer evaluation for gastric perforation. EXAM: CT ABDOMEN AND PELVIS WITH CONTRAST TECHNIQUE: Multidetector CT imaging of the abdomen and pelvis was performed using the standard protocol following bolus administration of intravenous contrast. RADIATION DOSE REDUCTION: This exam was performed according to the departmental dose-optimization program which includes automated exposure control, adjustment of the mA and/or kV according to patient size and/or use of iterative reconstruction technique. CONTRAST:  50mL OMNIPAQUE  IOHEXOL  350 MG/ML SOLN COMPARISON:  CT 11/22/2023 at 2:19 p.m. FINDINGS: Lower chest: No acute abnormality. Hepatobiliary: Scattered hemangiomas. No acute abnormality. Normal gallbladder and biliary tree. Pancreas:  Unremarkable. Spleen: Unremarkable. Adrenals/Urinary Tract: Normal adrenal glands. No urinary calculi or hydronephrosis. Contrast from prior CT within the ureters and bladder. Stomach/Bowel: Redemonstrated gastric wall thickening about the fundus. Gas and fluid collection with air-fluid level protruding from the gastric fundus measures 5.5 x 4.7 cm, grossly similar to earlier today. Enteric contrast is present within the stomach and small bowel. No discrete oral contrast is visualized within the gas and fluid collection however the overall density has increased compared to earlier today (Hounsfield units 38 versus 8 on the previous study). Soft tissue edema  and stranding within the lesser sac. No bowel obstruction.  The appendix is not confidently visualized. Vascular/Lymphatic: No significant vascular findings are present. No enlarged abdominal or pelvic lymph nodes. Reproductive: Enlarged prostate. Other: No free intraperitoneal air. Musculoskeletal: No acute fracture. IMPRESSION: Findings remain concerning for gastric ulceration with contained perforation. No discrete oral contrast is visualized within the gas and fluid collection however the overall density has increased compared to earlier today which may be due to dilute oral contrast within the collection. Primary differential consideration is gastric diverticulitis. Electronically Signed   By: Norman Gatlin M.D.   On: 11/22/2023 21:25   CT ABDOMEN PELVIS W CONTRAST Result Date: 11/22/2023 CLINICAL DATA:  Left flank pain. EXAM: CT ABDOMEN AND PELVIS WITH CONTRAST TECHNIQUE: Multidetector CT imaging of the abdomen and pelvis was performed using the standard protocol following bolus administration of intravenous contrast. RADIATION DOSE REDUCTION: This exam was performed according to the departmental dose-optimization program which includes automated exposure control, adjustment of the mA and/or kV according to patient size and/or use of iterative  reconstruction technique. CONTRAST:  75mL OMNIPAQUE  IOHEXOL  350 MG/ML SOLN COMPARISON:  MRI 08/11/2016 FINDINGS: Lower chest: No pleural effusion or consolidative change identified. Hepatobiliary: Previously characterized liver hemangiomas are again identified within the anterior right lobe and far lateral left lobe. The left lobe of liver hemangioma measures 2.8 cm, image 1.6/3. Anterior right hepatic lobe hemangioma measures 1.1 cm, image 119/3. Gallbladder appears normal. No bile duct dilatation Pancreas: Unremarkable. No pancreatic ductal dilatation or surrounding inflammatory changes. Spleen: Normal in size without focal abnormality. Adrenals/Urinary Tract: Normal adrenal glands. No nephrolithiasis, hydronephrosis or suspicious mass. Urinary bladder appears normal. Stomach/Bowel: Assessment of bowel pathology is diminished due to lack of enteric contrast material. There is abnormal wall thickening involving the gastric fundus there is a gas and fluid collection between the proximal stomach measuring 6.1 x 1.4 by 4.1 cm. There is an air-fluid level within this fluid collection which may communicate with the lumen of the stomach, image 20/3. This area is difficult to scratch edema and soft tissue stranding is noted within the lesser sac, there is diffuse edema identified within the lesser sac. The small bowel loops appear nondilated. Scattered mildly dilated loops of small bowel are identified which measure up to 2.2 cm. Lack of enteric contrast material and paucity of visceral fat limits assessment for the appendix which is not confidently identified. Vascular/Lymphatic: Normal caliber of the abdominal aorta. No adenopathy identified. Reproductive: Prostate gland enlargement. Other: No significant free fluid identified. No signs of free perforation. Musculoskeletal: No acute or significant osseous findings. IMPRESSION: 1. There is abnormal wall thickening involving the gastric fundus. There is a gas and fluid  collection between the proximal stomach measuring 6.1 x 1.4 x 4.1 cm. There is an air-fluid level within this fluid collection which may communicate with the lumen of the stomach. Findings are concerning for gastric ulceration with contained perforation. Consider further evaluation with repeat CT of the abdomen following oral contrast and IV contrast administration. 2. Scattered mildly dilated loops of small bowel are identified which measure up to 2.2 cm. Findings are favored to represent ileus. 3. Lack of enteric contrast material and paucity of visceral fat limits assessment for the appendix which is not confidently identified. 4. Prostate gland enlargement. 5. Previously characterized liver hemangiomas are again identified. Electronically Signed   By: Waddell Calk M.D.   On: 11/22/2023 15:11   CT Angio Chest PE W and/or Wo Contrast Result Date: 11/22/2023 CLINICAL  DATA:  Shortness of breath for 1 week. Rule out pulmonary embolus. EXAM: CT ANGIOGRAPHY CHEST WITH CONTRAST TECHNIQUE: Multidetector CT imaging of the chest was performed using the standard protocol during bolus administration of intravenous contrast. Multiplanar CT image reconstructions and MIPs were obtained to evaluate the vascular anatomy. RADIATION DOSE REDUCTION: This exam was performed according to the departmental dose-optimization program which includes automated exposure control, adjustment of the mA and/or kV according to patient size and/or use of iterative reconstruction technique. CONTRAST:  75mL OMNIPAQUE  IOHEXOL  350 MG/ML SOLN COMPARISON:  None Available. FINDINGS: Cardiovascular: Satisfactory opacification of the pulmonary arteries to the segmental level. No evidence of pulmonary embolism. Normal heart size. No pericardial effusion. Mediastinum/Nodes: No enlarged mediastinal, hilar, or axillary lymph nodes. Thyroid  gland, trachea, and esophagus demonstrate no significant findings. Lungs/Pleura: Bilateral increased thickening of the  peribronchovascular interstitium. Signs of mucous plugging identified within the lower lobe airways bilaterally. No signs of pleural effusion, airspace consolidation, atelectasis or pneumothorax. No suspicious pulmonary nodule or mass identified bilaterally. Upper Abdomen: No acute abnormality. See report for abdomen and pelvis CT also performed today. Musculoskeletal: No chest wall abnormality. No acute or significant osseous findings. Review of the MIP images confirms the above findings. IMPRESSION: 1. No evidence for acute pulmonary embolus. 2. Bilateral increased thickening of the peribronchovascular interstitium. Signs of mucous plugging identified within the lower lobe airways bilaterally. Findings are nonspecific but may reflect bronchitis. Electronically Signed   By: Waddell Calk M.D.   On: 11/22/2023 14:56   DG Chest 2 View Result Date: 11/22/2023 CLINICAL DATA:  53 year old male with history of shortness of breath. EXAM: CHEST - 2 VIEW COMPARISON:  Chest x-ray 03/08/2008. FINDINGS: Lung volumes are normal. No consolidative airspace disease. No pleural effusions. No pneumothorax. No pulmonary nodule or mass noted. Pulmonary vasculature and the cardiomediastinal silhouette are within normal limits. IMPRESSION: No radiographic evidence of acute cardiopulmonary disease. Electronically Signed   By: Toribio Aye M.D.   On: 11/22/2023 10:02    Pending Labs Unresulted Labs (From admission, onward)     Start     Ordered   11/23/23 0500  HIV Antibody (routine testing w rflx)  (HIV Antibody (Routine testing w reflex) panel)  Tomorrow morning,   R        11/22/23 1918   11/23/23 0500  Comprehensive metabolic panel  Daily,   R      11/22/23 1918   11/23/23 0500  CBC  Daily,   R      11/22/23 1918            Vitals/Pain Today's Vitals   11/22/23 2000 11/22/23 2004 11/22/23 2250 11/23/23 0000  BP: 110/63  (!) 117/99 113/69  Pulse: 81  66 68  Resp: (!) 0  17 18  Temp:   98.1 F (36.7 C)    TempSrc:   Oral   SpO2: 99%  98% 98%  PainSc:  0-No pain      Isolation Precautions No active isolations  Medications Medications  pantoprazole  (PROTONIX ) injection 40 mg (40 mg Intravenous Given 11/22/23 1601)    Followed by  pantoprazole  (PROTONIX ) injection 40 mg (has no administration in time range)  sodium chloride  flush (NS) 0.9 % injection 3 mL (has no administration in time range)  acetaminophen  (TYLENOL ) tablet 650 mg (has no administration in time range)    Or  acetaminophen  (TYLENOL ) suppository 650 mg (has no administration in time range)  HYDROmorphone  (DILAUDID ) injection 0.5-1 mg (has no administration in time  range)  ondansetron  (ZOFRAN ) tablet 4 mg (has no administration in time range)    Or  ondansetron  (ZOFRAN ) injection 4 mg (has no administration in time range)  piperacillin -tazobactam (ZOSYN ) IVPB 3.375 g (3.375 g Intravenous New Bag/Given 11/22/23 2209)  fluconazole  (DIFLUCAN ) IVPB 400 mg (has no administration in time range)  ipratropium-albuterol  (DUONEB) 0.5-2.5 (3) MG/3ML nebulizer solution 3 mL (3 mLs Nebulization Given 11/22/23 1433)  predniSONE  (DELTASONE ) tablet 60 mg (60 mg Oral Given 11/22/23 1432)  iohexol  (OMNIPAQUE ) 350 MG/ML injection 75 mL (75 mLs Intravenous Contrast Given 11/22/23 1418)  piperacillin -tazobactam (ZOSYN ) IVPB 3.375 g (0 g Intravenous Stopped 11/22/23 1635)  fluconazole  (DIFLUCAN ) tablet 150 mg (150 mg Oral Given 11/22/23 1758)  iohexol  (OMNIPAQUE ) 350 MG/ML injection 50 mL (50 mLs Intravenous Contrast Given 11/22/23 2059)  iohexol  (OMNIPAQUE ) 9 MG/ML oral solution 500 mL (500 mLs Oral Contrast Given 11/22/23 0840)    Mobility walks     Focused Assessments Cardiac Assessment Handoff:  Cardiac Rhythm: Normal sinus rhythm Lab Results  Component Value Date   CKTOTAL 147 03/30/2018   TROPONINI 0.04 (HH) 03/09/2018   No results found for: DDIMER Does the Patient currently have chest pain? No    R Recommendations: See Admitting  Provider Note  Report given to:   Additional Notes:

## 2023-11-23 NOTE — Progress Notes (Addendum)
 Patient agreeable for CSW to reach out to financial counseling to screen him for medicaid. CSW emailed [place] in financial counseling and requested for patient to be screened for medicaid. CSW offered patient [facility] resources. Patient accepted. CSW offered patient transportation resources. Patient politely declined.CSW will continue to follow.

## 2023-11-23 NOTE — Anesthesia Preprocedure Evaluation (Addendum)
 Anesthesia Evaluation  Patient identified by MRN, date of birth, ID band Patient awake    Reviewed: Allergy & Precautions, NPO status , Patient's Chart, lab work & pertinent test results  Airway Mallampati: I  TM Distance: >3 FB Neck ROM: Full    Dental  (+) Missing, Chipped, Dental Advisory Given, Poor Dentition   Pulmonary Current Smoker and Patient abstained from smoking.   Pulmonary exam normal breath sounds clear to auscultation       Cardiovascular Normal cardiovascular exam Rhythm:Regular Rate:Normal  TTE 2019 - Normal LV function; mild LVE; mild LAE; moderate TR with mild    pulmonary hypertension.     Neuro/Psych  PSYCHIATRIC DISORDERS  Depression    negative neurological ROS     GI/Hepatic PUD,GERD  ,,(+)     substance abuse  alcohol use and marijuana use  Endo/Other  negative endocrine ROS    Renal/GU negative Renal ROS  negative genitourinary   Musculoskeletal negative musculoskeletal ROS (+)    Abdominal   Peds  Hematology  (+) Blood dyscrasia, anemia Lab Results      Component                Value               Date                      WBC                      5.8                 11/24/2023                HGB                      11.0 (L)            11/24/2023                HCT                      32.7 (L)            11/24/2023                MCV                      77.3 (L)            11/24/2023                PLT                      682 (H)             11/24/2023              Anesthesia Other Findings Perigastric fluid collection, ? Continued gastric perforation    Reproductive/Obstetrics                             Anesthesia Physical Anesthesia Plan  ASA: 3  Anesthesia Plan: MAC   Post-op Pain Management:    Induction: Intravenous  PONV Risk Score and Plan: Propofol  infusion and Treatment may vary due to age or medical condition  Airway Management  Planned: Natural Airway  Additional Equipment:   Intra-op Plan:   Post-operative Plan:  Informed Consent: I have reviewed the patients History and Physical, chart, labs and discussed the procedure including the risks, benefits and alternatives for the proposed anesthesia with the patient or authorized representative who has indicated his/her understanding and acceptance.     Dental advisory given  Plan Discussed with: CRNA  Anesthesia Plan Comments:        Anesthesia Quick Evaluation

## 2023-11-23 NOTE — ED Notes (Signed)
 Pt well appearing upon transport upstairs.

## 2023-11-23 NOTE — Progress Notes (Signed)
 Subjective: Patient states he feels a lot better today.  No nausea  Frustrated with feeling like he is going to lose his apartment while here, has a dog in the apartment, and that he won't be able to eat and drink potentially for several days.  ROS: See above, otherwise other systems negative  Objective: Vital signs in last 24 hours: Temp:  [97.5 F (36.4 C)-98.1 F (36.7 C)] 97.5 F (36.4 C) (01/06 0700) Pulse Rate:  [54-106] 54 (01/06 0700) Resp:  [0-26] 13 (01/06 0700) BP: (94-121)/(61-99) 112/75 (01/06 0700) SpO2:  [94 %-100 %] 96 % (01/06 0700)    Intake/Output from previous day: No intake/output data recorded. Intake/Output this shift: No intake/output data recorded.  PE: Gen: NAD, resting comfortably Heart: regular Lungs: CTAB Abd: soft, mildly tender in epigastrium, ND  Lab Results:  Recent Labs    11/22/23 0933 11/23/23 0452  WBC 18.9* 8.6  HGB 11.8* 8.1*  HCT 34.4* 23.8*  PLT 813* 849*   BMET Recent Labs    11/22/23 0933 11/23/23 0452  NA 140 137  K 4.5 4.5  CL 101 101  CO2 27 22  GLUCOSE 95 127*  BUN 25* 24*  CREATININE 0.96 0.97  CALCIUM  8.9 8.4*   PT/INR No results for input(s): LABPROT, INR in the last 72 hours. CMP     Component Value Date/Time   NA 137 11/23/2023 0452   K 4.5 11/23/2023 0452   CL 101 11/23/2023 0452   CO2 22 11/23/2023 0452   GLUCOSE 127 (H) 11/23/2023 0452   BUN 24 (H) 11/23/2023 0452   CREATININE 0.97 11/23/2023 0452   CREATININE 1.11 04/21/2014 1032   CALCIUM  8.4 (L) 11/23/2023 0452   PROT 6.7 11/23/2023 0452   ALBUMIN 2.2 (L) 11/23/2023 0452   AST 53 (H) 11/23/2023 0452   ALT 87 (H) 11/23/2023 0452   ALKPHOS 134 (H) 11/23/2023 0452   BILITOT 0.4 11/23/2023 0452   GFRNONAA >60 11/23/2023 0452   GFRNONAA 81 04/21/2014 1032   GFRAA >60 03/31/2018 0640   GFRAA >89 04/21/2014 1032   Lipase     Component Value Date/Time   LIPASE 47 11/22/2023 0933       Studies/Results: CT ABDOMEN  PELVIS W CONTRAST Result Date: 11/22/2023 CLINICAL DATA:  Duodenal ulcer evaluation for gastric perforation. EXAM: CT ABDOMEN AND PELVIS WITH CONTRAST TECHNIQUE: Multidetector CT imaging of the abdomen and pelvis was performed using the standard protocol following bolus administration of intravenous contrast. RADIATION DOSE REDUCTION: This exam was performed according to the departmental dose-optimization program which includes automated exposure control, adjustment of the mA and/or kV according to patient size and/or use of iterative reconstruction technique. CONTRAST:  50mL OMNIPAQUE  IOHEXOL  350 MG/ML SOLN COMPARISON:  CT 11/22/2023 at 2:19 p.m. FINDINGS: Lower chest: No acute abnormality. Hepatobiliary: Scattered hemangiomas. No acute abnormality. Normal gallbladder and biliary tree. Pancreas: Unremarkable. Spleen: Unremarkable. Adrenals/Urinary Tract: Normal adrenal glands. No urinary calculi or hydronephrosis. Contrast from prior CT within the ureters and bladder. Stomach/Bowel: Redemonstrated gastric wall thickening about the fundus. Gas and fluid collection with air-fluid level protruding from the gastric fundus measures 5.5 x 4.7 cm, grossly similar to earlier today. Enteric contrast is present within the stomach and small bowel. No discrete oral contrast is visualized within the gas and fluid collection however the overall density has increased compared to earlier today (Hounsfield units 38 versus 8 on the previous study). Soft tissue edema and stranding within the lesser sac. No bowel  obstruction.  The appendix is not confidently visualized. Vascular/Lymphatic: No significant vascular findings are present. No enlarged abdominal or pelvic lymph nodes. Reproductive: Enlarged prostate. Other: No free intraperitoneal air. Musculoskeletal: No acute fracture. IMPRESSION: Findings remain concerning for gastric ulceration with contained perforation. No discrete oral contrast is visualized within the gas and fluid  collection however the overall density has increased compared to earlier today which may be due to dilute oral contrast within the collection. Primary differential consideration is gastric diverticulitis. Electronically Signed   By: Norman Gatlin M.D.   On: 11/22/2023 21:25   CT ABDOMEN PELVIS W CONTRAST Result Date: 11/22/2023 CLINICAL DATA:  Left flank pain. EXAM: CT ABDOMEN AND PELVIS WITH CONTRAST TECHNIQUE: Multidetector CT imaging of the abdomen and pelvis was performed using the standard protocol following bolus administration of intravenous contrast. RADIATION DOSE REDUCTION: This exam was performed according to the departmental dose-optimization program which includes automated exposure control, adjustment of the mA and/or kV according to patient size and/or use of iterative reconstruction technique. CONTRAST:  75mL OMNIPAQUE  IOHEXOL  350 MG/ML SOLN COMPARISON:  MRI 08/11/2016 FINDINGS: Lower chest: No pleural effusion or consolidative change identified. Hepatobiliary: Previously characterized liver hemangiomas are again identified within the anterior right lobe and far lateral left lobe. The left lobe of liver hemangioma measures 2.8 cm, image 1.6/3. Anterior right hepatic lobe hemangioma measures 1.1 cm, image 119/3. Gallbladder appears normal. No bile duct dilatation Pancreas: Unremarkable. No pancreatic ductal dilatation or surrounding inflammatory changes. Spleen: Normal in size without focal abnormality. Adrenals/Urinary Tract: Normal adrenal glands. No nephrolithiasis, hydronephrosis or suspicious mass. Urinary bladder appears normal. Stomach/Bowel: Assessment of bowel pathology is diminished due to lack of enteric contrast material. There is abnormal wall thickening involving the gastric fundus there is a gas and fluid collection between the proximal stomach measuring 6.1 x 1.4 by 4.1 cm. There is an air-fluid level within this fluid collection which may communicate with the lumen of the stomach,  image 20/3. This area is difficult to scratch edema and soft tissue stranding is noted within the lesser sac, there is diffuse edema identified within the lesser sac. The small bowel loops appear nondilated. Scattered mildly dilated loops of small bowel are identified which measure up to 2.2 cm. Lack of enteric contrast material and paucity of visceral fat limits assessment for the appendix which is not confidently identified. Vascular/Lymphatic: Normal caliber of the abdominal aorta. No adenopathy identified. Reproductive: Prostate gland enlargement. Other: No significant free fluid identified. No signs of free perforation. Musculoskeletal: No acute or significant osseous findings. IMPRESSION: 1. There is abnormal wall thickening involving the gastric fundus. There is a gas and fluid collection between the proximal stomach measuring 6.1 x 1.4 x 4.1 cm. There is an air-fluid level within this fluid collection which may communicate with the lumen of the stomach. Findings are concerning for gastric ulceration with contained perforation. Consider further evaluation with repeat CT of the abdomen following oral contrast and IV contrast administration. 2. Scattered mildly dilated loops of small bowel are identified which measure up to 2.2 cm. Findings are favored to represent ileus. 3. Lack of enteric contrast material and paucity of visceral fat limits assessment for the appendix which is not confidently identified. 4. Prostate gland enlargement. 5. Previously characterized liver hemangiomas are again identified. Electronically Signed   By: Waddell Calk M.D.   On: 11/22/2023 15:11   CT Angio Chest PE W and/or Wo Contrast Result Date: 11/22/2023 CLINICAL DATA:  Shortness of breath for 1 week.  Rule out pulmonary embolus. EXAM: CT ANGIOGRAPHY CHEST WITH CONTRAST TECHNIQUE: Multidetector CT imaging of the chest was performed using the standard protocol during bolus administration of intravenous contrast. Multiplanar CT  image reconstructions and MIPs were obtained to evaluate the vascular anatomy. RADIATION DOSE REDUCTION: This exam was performed according to the departmental dose-optimization program which includes automated exposure control, adjustment of the mA and/or kV according to patient size and/or use of iterative reconstruction technique. CONTRAST:  75mL OMNIPAQUE  IOHEXOL  350 MG/ML SOLN COMPARISON:  None Available. FINDINGS: Cardiovascular: Satisfactory opacification of the pulmonary arteries to the segmental level. No evidence of pulmonary embolism. Normal heart size. No pericardial effusion. Mediastinum/Nodes: No enlarged mediastinal, hilar, or axillary lymph nodes. Thyroid  gland, trachea, and esophagus demonstrate no significant findings. Lungs/Pleura: Bilateral increased thickening of the peribronchovascular interstitium. Signs of mucous plugging identified within the lower lobe airways bilaterally. No signs of pleural effusion, airspace consolidation, atelectasis or pneumothorax. No suspicious pulmonary nodule or mass identified bilaterally. Upper Abdomen: No acute abnormality. See report for abdomen and pelvis CT also performed today. Musculoskeletal: No chest wall abnormality. No acute or significant osseous findings. Review of the MIP images confirms the above findings. IMPRESSION: 1. No evidence for acute pulmonary embolus. 2. Bilateral increased thickening of the peribronchovascular interstitium. Signs of mucous plugging identified within the lower lobe airways bilaterally. Findings are nonspecific but may reflect bronchitis. Electronically Signed   By: Waddell Calk M.D.   On: 11/22/2023 14:56   DG Chest 2 View Result Date: 11/22/2023 CLINICAL DATA:  53 year old male with history of shortness of breath. EXAM: CHEST - 2 VIEW COMPARISON:  Chest x-ray 03/08/2008. FINDINGS: Lung volumes are normal. No consolidative airspace disease. No pleural effusions. No pneumothorax. No pulmonary nodule or mass noted.  Pulmonary vasculature and the cardiomediastinal silhouette are within normal limits. IMPRESSION: No radiographic evidence of acute cardiopulmonary disease. Electronically Signed   By: Toribio Aye M.D.   On: 11/22/2023 10:02    Anti-infectives: Anti-infectives (From admission, onward)    Start     Dose/Rate Route Frequency Ordered Stop   11/22/23 2200  piperacillin -tazobactam (ZOSYN ) IVPB 3.375 g        3.375 g 12.5 mL/hr over 240 Minutes Intravenous Every 8 hours 11/22/23 1942     11/22/23 2200  fluconazole  (DIFLUCAN ) IVPB 400 mg        400 mg 100 mL/hr over 120 Minutes Intravenous Every 24 hours 11/22/23 2202     11/22/23 1745  fluconazole  (DIFLUCAN ) tablet 150 mg        150 mg Oral  Once 11/22/23 1735 11/22/23 1758   11/22/23 1530  piperacillin -tazobactam (ZOSYN ) IVPB 3.375 g        3.375 g 100 mL/hr over 30 Minutes Intravenous  Once 11/22/23 1520 11/22/23 1635   11/22/23 0000  azithromycin  (ZITHROMAX ) 250 MG tablet        250 mg Oral Daily 11/22/23 1512     11/22/23 0000  amoxicillin -clavulanate (AUGMENTIN ) 875-125 MG tablet        1 tablet Oral Every 12 hours 11/22/23 1512          Assessment/Plan Perigastric fluid collection, ? Continued gastric perforation -patient feeling much better today, WBC normal, AF -will ask IR to drain this collection to see if we can treat this conservatively for now -repeat CT scan from yesterday reviewed. -patient has had some unintentional weight loss, so if we can get this better and get him to an EGD for further evaluation that would be ideal  prior to surgery in case there is a malignancy or some other finding that requires a different operation that just an oversew. -we will cont to follow -cont IV abx therapy/PPI therapy -cont NPO  FEN - NPO/IVFs VTE - scds ID - [email or web address]   Anemia - unclear etiology, but likely secondary to process in stomach, hgb 11.8 to 8.4, cont to monitor Chronic nausea - ? Related to marijuana use vs  gastric process GERD Tobacco abuse - needs to stop smoking at this can lead to ulcer disease as well  I reviewed nursing notes, hospitalist notes, last 24 h vitals and pain scores, last 48 h intake and output, last 24 h labs and trends, and last 24 h imaging results.   LOS: 1 day    Burnard FORBES Banter , [facility] Surgery 11/23/2023, 7:54 AM Please see Amion for pager number during day hours 7:00am-4:30pm or 7:00am -11:30am on weekends

## 2023-11-23 NOTE — Progress Notes (Signed)
 PROGRESS NOTE    Henry Kramer  FMW:[id number] DOB: [birth year] DOA: 11/22/2023 PCP: Scarlett Ronal Caldron, NP  Outpatient Specialists:     Brief Narrative:  Patient is a 53 year old male past medical history significant for GERD, chronic nausea, substance abuse and homelessness.  Patient has also been on NSAIDs.  Patient was admitted with abdominal discomfort/pain and nausea.  CT of the abdomen was concerning for gastric wall thickening with gas and fluid collection which could reflect contained perforated of gastric ulcer.  Surgery team is directing care.  Surgery team is considering IR consult for drainage.  Patient is on IV Zosyn  and Diflucan .  CT with p.o. contrast ordered by the surgical team.  GI team input is appreciated.  For EGD tomorrow.  Patient is on IV Protonix  40 Mg twice daily.  11/23/2023: Patient seen.  Nausea seems to have resolved.  Abdominal pain also seems to have resolved.   Assessment & Plan:   Principal Problem:   Gastric ulcer with perforation (HCC)   Abdominal pain/nausea/possible gastric perforation: -History of NSAIDs. -CT findings as documented above. -Surgical and GI input is appreciated. -For EGD tomorrow. -Continue IV Zosyn  and Diflucan . -Continue IV Protonix . -Abdominal pain is resolving. -Patient is no longer experiencing nausea.  However, patient is NPO. -Further management will depend on hospital course.  GERD: -Resume history of NSAID use. -Continue IV Protonix .  History of substance abuse and homelessness:   DVT prophylaxis:  Code Status: Full code. Family Communication:  Disposition Plan:    Consultants:  GI. Surgery  Procedures:  EGD tomorrow  Antimicrobials:  IV Zosyn . IV Diflucan    Subjective: Abdominal pain and nausea have improved significantly  Objective: Vitals:   11/23/23 0252 11/23/23 0400 11/23/23 0700 11/23/23 0928  BP:  94/66 112/75 116/77  Pulse:  (!) 56 (!) 54 (!) 104  Resp:  16 13 16   Temp: 97.6 F  (36.4 C)  (!) 97.5 F (36.4 C) 97.7 F (36.5 C)  TempSrc: Oral   Oral  SpO2:  98% 96% 94%   No intake or output data in the 24 hours ending 11/23/23 1229 There were no vitals filed for this visit.  Examination:  General exam: Appears calm and comfortable.  Patient is cachectic. Respiratory system: Clear to auscultation.  Cardiovascular system: S1 & S2 heard Gastrointestinal system: Abdomen is soft and nontender.   Central nervous system: Alert and oriented.  Patient moves all extremities. Extremities: No leg edema.  Data Reviewed: I have personally reviewed following labs and imaging studies  CBC: Recent Labs  Lab 11/22/23 0933 11/23/23 0452  WBC 18.9* 8.6  HGB 11.8* 8.1*  HCT 34.4* 23.8*  MCV 78.2* 78.3*  PLT 813* 849*   Basic Metabolic Panel: Recent Labs  Lab 11/22/23 0933 11/23/23 0452  NA 140 137  K 4.5 4.5  CL 101 101  CO2 27 22  GLUCOSE 95 127*  BUN 25* 24*  CREATININE 0.96 0.97  CALCIUM  8.9 8.4*   GFR: CrCl cannot be calculated (Unknown ideal weight.). Liver Function Tests: Recent Labs  Lab 11/22/23 0933 11/23/23 0452  AST 54* 53*  ALT 91* 87*  ALKPHOS 136* 134*  BILITOT 0.3 0.4  PROT 7.5 6.7  ALBUMIN 2.4* 2.2*   Recent Labs  Lab 11/22/23 0933  LIPASE 47   No results for input(s): AMMONIA in the last 168 hours. Coagulation Profile: No results for input(s): INR, PROTIME in the last 168 hours. Cardiac Enzymes: No results for input(s): CKTOTAL, CKMB, CKMBINDEX, TROPONINI in the  last 168 hours. BNP (last 3 results) No results for input(s): PROBNP in the last 8760 hours. HbA1C: No results for input(s): HGBA1C in the last 72 hours. CBG: No results for input(s): GLUCAP in the last 168 hours. Lipid Profile: No results for input(s): CHOL, HDL, LDLCALC, TRIG, CHOLHDL, LDLDIRECT in the last 72 hours. Thyroid  Function Tests: No results for input(s): TSH, T4TOTAL, FREET4, T3FREE, THYROIDAB in the last  72 hours. Anemia Panel: No results for input(s): VITAMINB12, FOLATE, FERRITIN, TIBC, IRON, RETICCTPCT in the last 72 hours. Urine analysis:    Component Value Date/Time   COLORURINE YELLOW 11/22/2023 0931   APPEARANCEUR CLEAR 11/22/2023 0931   APPEARANCEUR Clear 07/30/2016 1522   LABSPEC 1.015 11/22/2023 0931   PHURINE 5.0 11/22/2023 0931   GLUCOSEU NEGATIVE 11/22/2023 0931   HGBUR NEGATIVE 11/22/2023 0931   BILIRUBINUR NEGATIVE 11/22/2023 0931   BILIRUBINUR Negative 07/30/2016 1522   KETONESUR NEGATIVE 11/22/2023 0931   PROTEINUR NEGATIVE 11/22/2023 0931   UROBILINOGEN 0.2 01/15/2015 1821   NITRITE NEGATIVE 11/22/2023 0931   LEUKOCYTESUR NEGATIVE 11/22/2023 0931   Sepsis Labs: @LABRCNTIP (procalcitonin:4,lacticidven:4)  )No results found for this or any previous visit (from the past 240 hours).       Radiology Studies: CT ABDOMEN PELVIS W CONTRAST Result Date: 11/22/2023 CLINICAL DATA:  Duodenal ulcer evaluation for gastric perforation. EXAM: CT ABDOMEN AND PELVIS WITH CONTRAST TECHNIQUE: Multidetector CT imaging of the abdomen and pelvis was performed using the standard protocol following bolus administration of intravenous contrast. RADIATION DOSE REDUCTION: This exam was performed according to the departmental dose-optimization program which includes automated exposure control, adjustment of the mA and/or kV according to patient size and/or use of iterative reconstruction technique. CONTRAST:  50mL OMNIPAQUE  IOHEXOL  350 MG/ML SOLN COMPARISON:  CT 11/22/2023 at 2:19 p.m. FINDINGS: Lower chest: No acute abnormality. Hepatobiliary: Scattered hemangiomas. No acute abnormality. Normal gallbladder and biliary tree. Pancreas: Unremarkable. Spleen: Unremarkable. Adrenals/Urinary Tract: Normal adrenal glands. No urinary calculi or hydronephrosis. Contrast from prior CT within the ureters and bladder. Stomach/Bowel: Redemonstrated gastric wall thickening about the fundus. Gas and  fluid collection with air-fluid level protruding from the gastric fundus measures 5.5 x 4.7 cm, grossly similar to earlier today. Enteric contrast is present within the stomach and small bowel. No discrete oral contrast is visualized within the gas and fluid collection however the overall density has increased compared to earlier today (Hounsfield units 38 versus 8 on the previous study). Soft tissue edema and stranding within the lesser sac. No bowel obstruction.  The appendix is not confidently visualized. Vascular/Lymphatic: No significant vascular findings are present. No enlarged abdominal or pelvic lymph nodes. Reproductive: Enlarged prostate. Other: No free intraperitoneal air. Musculoskeletal: No acute fracture. IMPRESSION: Findings remain concerning for gastric ulceration with contained perforation. No discrete oral contrast is visualized within the gas and fluid collection however the overall density has increased compared to earlier today which may be due to dilute oral contrast within the collection. Primary differential consideration is gastric diverticulitis. Electronically Signed   By: Norman Gatlin M.D.   On: 11/22/2023 21:25   CT ABDOMEN PELVIS W CONTRAST Result Date: 11/22/2023 CLINICAL DATA:  Left flank pain. EXAM: CT ABDOMEN AND PELVIS WITH CONTRAST TECHNIQUE: Multidetector CT imaging of the abdomen and pelvis was performed using the standard protocol following bolus administration of intravenous contrast. RADIATION DOSE REDUCTION: This exam was performed according to the departmental dose-optimization program which includes automated exposure control, adjustment of the mA and/or kV according to patient size and/or use of  iterative reconstruction technique. CONTRAST:  75mL OMNIPAQUE  IOHEXOL  350 MG/ML SOLN COMPARISON:  MRI 08/11/2016 FINDINGS: Lower chest: No pleural effusion or consolidative change identified. Hepatobiliary: Previously characterized liver hemangiomas are again identified  within the anterior right lobe and far lateral left lobe. The left lobe of liver hemangioma measures 2.8 cm, image 1.6/3. Anterior right hepatic lobe hemangioma measures 1.1 cm, image 119/3. Gallbladder appears normal. No bile duct dilatation Pancreas: Unremarkable. No pancreatic ductal dilatation or surrounding inflammatory changes. Spleen: Normal in size without focal abnormality. Adrenals/Urinary Tract: Normal adrenal glands. No nephrolithiasis, hydronephrosis or suspicious mass. Urinary bladder appears normal. Stomach/Bowel: Assessment of bowel pathology is diminished due to lack of enteric contrast material. There is abnormal wall thickening involving the gastric fundus there is a gas and fluid collection between the proximal stomach measuring 6.1 x 1.4 by 4.1 cm. There is an air-fluid level within this fluid collection which may communicate with the lumen of the stomach, image 20/3. This area is difficult to scratch edema and soft tissue stranding is noted within the lesser sac, there is diffuse edema identified within the lesser sac. The small bowel loops appear nondilated. Scattered mildly dilated loops of small bowel are identified which measure up to 2.2 cm. Lack of enteric contrast material and paucity of visceral fat limits assessment for the appendix which is not confidently identified. Vascular/Lymphatic: Normal caliber of the abdominal aorta. No adenopathy identified. Reproductive: Prostate gland enlargement. Other: No significant free fluid identified. No signs of free perforation. Musculoskeletal: No acute or significant osseous findings. IMPRESSION: 1. There is abnormal wall thickening involving the gastric fundus. There is a gas and fluid collection between the proximal stomach measuring 6.1 x 1.4 x 4.1 cm. There is an air-fluid level within this fluid collection which may communicate with the lumen of the stomach. Findings are concerning for gastric ulceration with contained perforation. Consider  further evaluation with repeat CT of the abdomen following oral contrast and IV contrast administration. 2. Scattered mildly dilated loops of small bowel are identified which measure up to 2.2 cm. Findings are favored to represent ileus. 3. Lack of enteric contrast material and paucity of visceral fat limits assessment for the appendix which is not confidently identified. 4. Prostate gland enlargement. 5. Previously characterized liver hemangiomas are again identified. Electronically Signed   By: Waddell Calk M.D.   On: 11/22/2023 15:11   CT Angio Chest PE W and/or Wo Contrast Result Date: 11/22/2023 CLINICAL DATA:  Shortness of breath for 1 week. Rule out pulmonary embolus. EXAM: CT ANGIOGRAPHY CHEST WITH CONTRAST TECHNIQUE: Multidetector CT imaging of the chest was performed using the standard protocol during bolus administration of intravenous contrast. Multiplanar CT image reconstructions and MIPs were obtained to evaluate the vascular anatomy. RADIATION DOSE REDUCTION: This exam was performed according to the departmental dose-optimization program which includes automated exposure control, adjustment of the mA and/or kV according to patient size and/or use of iterative reconstruction technique. CONTRAST:  75mL OMNIPAQUE  IOHEXOL  350 MG/ML SOLN COMPARISON:  None Available. FINDINGS: Cardiovascular: Satisfactory opacification of the pulmonary arteries to the segmental level. No evidence of pulmonary embolism. Normal heart size. No pericardial effusion. Mediastinum/Nodes: No enlarged mediastinal, hilar, or axillary lymph nodes. Thyroid  gland, trachea, and esophagus demonstrate no significant findings. Lungs/Pleura: Bilateral increased thickening of the peribronchovascular interstitium. Signs of mucous plugging identified within the lower lobe airways bilaterally. No signs of pleural effusion, airspace consolidation, atelectasis or pneumothorax. No suspicious pulmonary nodule or mass identified bilaterally.  Upper Abdomen: No acute abnormality.  See report for abdomen and pelvis CT also performed today. Musculoskeletal: No chest wall abnormality. No acute or significant osseous findings. Review of the MIP images confirms the above findings. IMPRESSION: 1. No evidence for acute pulmonary embolus. 2. Bilateral increased thickening of the peribronchovascular interstitium. Signs of mucous plugging identified within the lower lobe airways bilaterally. Findings are nonspecific but may reflect bronchitis. Electronically Signed   By: Waddell Calk M.D.   On: 11/22/2023 14:56   DG Chest 2 View Result Date: 11/22/2023 CLINICAL DATA:  53 year old male with history of shortness of breath. EXAM: CHEST - 2 VIEW COMPARISON:  Chest x-ray 03/08/2008. FINDINGS: Lung volumes are normal. No consolidative airspace disease. No pleural effusions. No pneumothorax. No pulmonary nodule or mass noted. Pulmonary vasculature and the cardiomediastinal silhouette are within normal limits. IMPRESSION: No radiographic evidence of acute cardiopulmonary disease. Electronically Signed   By: Toribio Aye M.D.   On: 11/22/2023 10:02        Scheduled Meds:  [START ON 11/24/2023] influenza vac split trivalent PF  0.5 mL Intramuscular Tomorrow-1000   pantoprazole  (PROTONIX ) IV  40 mg Intravenous Q12H   [START ON 11/24/2023] pneumococcal 20-valent conjugate vaccine  0.5 mL Intramuscular Tomorrow-1000   sodium chloride  flush  3 mL Intravenous Q12H   Continuous Infusions:  fluconazole  (DIFLUCAN ) IV Stopped (11/23/23 0454)   piperacillin -tazobactam (ZOSYN )  IV 3.375 g (11/23/23 0650)     LOS: 1 day    Time spent: 35 minutes.    Leatrice Chapel, MD  [facility] Pager #: [phone] 7PM-7AM contact night coverage as above

## 2023-11-24 ENCOUNTER — Inpatient Hospital Stay (HOSPITAL_COMMUNITY): Payer: Medicaid Other | Admitting: Certified Registered"

## 2023-11-24 ENCOUNTER — Encounter (HOSPITAL_COMMUNITY)
Admission: EM | Disposition: A | Discharge status: Home / Self Care | Payer: Self-pay | Source: Home / Self Care | Attending: Internal Medicine

## 2023-11-24 ENCOUNTER — Encounter (HOSPITAL_COMMUNITY): Payer: Self-pay | Admitting: Family Medicine

## 2023-11-24 ENCOUNTER — Inpatient Hospital Stay (HOSPITAL_COMMUNITY): Payer: Medicaid Other

## 2023-11-24 DIAGNOSIS — K2970 Gastritis, unspecified, without bleeding: Secondary | ICD-10-CM

## 2023-11-24 DIAGNOSIS — K259 Gastric ulcer, unspecified as acute or chronic, without hemorrhage or perforation: Secondary | ICD-10-CM

## 2023-11-24 DIAGNOSIS — K449 Diaphragmatic hernia without obstruction or gangrene: Secondary | ICD-10-CM

## 2023-11-24 DIAGNOSIS — K31A11 Gastric intestinal metaplasia without dysplasia, involving the antrum: Secondary | ICD-10-CM

## 2023-11-24 DIAGNOSIS — B9681 Helicobacter pylori [H. pylori] as the cause of diseases classified elsewhere: Secondary | ICD-10-CM

## 2023-11-24 DIAGNOSIS — K2950 Unspecified chronic gastritis without bleeding: Secondary | ICD-10-CM

## 2023-11-24 DIAGNOSIS — D490 Neoplasm of unspecified behavior of digestive system: Secondary | ICD-10-CM

## 2023-11-24 HISTORY — PX: BIOPSY: SHX5522

## 2023-11-24 HISTORY — PX: ESOPHAGOGASTRODUODENOSCOPY (EGD) WITH PROPOFOL: SHX5813

## 2023-11-24 LAB — RAPID URINE DRUG SCREEN, HOSP PERFORMED
Amphetamines: NOT DETECTED
Barbiturates: NOT DETECTED
Benzodiazepines: NOT DETECTED
Cocaine: NOT DETECTED
Opiates: NOT DETECTED
Tetrahydrocannabinol: POSITIVE — AB

## 2023-11-24 LAB — CBC WITH DIFFERENTIAL/PLATELET
Abs Immature Granulocytes: 0 10*3/uL (ref 0.00–0.07)
Basophils Absolute: 0.1 10*3/uL (ref 0.0–0.1)
Basophils Relative: 1 %
Eosinophils Absolute: 0.1 10*3/uL (ref 0.0–0.5)
Eosinophils Relative: 1 %
HCT: 32.7 % — ABNORMAL LOW (ref 39.0–52.0)
Hemoglobin: 11 g/dL — ABNORMAL LOW (ref 13.0–17.0)
Lymphocytes Relative: 37 %
Lymphs Abs: 2.1 10*3/uL (ref 0.7–4.0)
MCH: 26 pg (ref 26.0–34.0)
MCHC: 33.6 g/dL (ref 30.0–36.0)
MCV: 77.3 fL — ABNORMAL LOW (ref 80.0–100.0)
Monocytes Absolute: 0.5 10*3/uL (ref 0.1–1.0)
Monocytes Relative: 9 %
Neutro Abs: 3 10*3/uL (ref 1.7–7.7)
Neutrophils Relative %: 52 %
Platelets: 682 10*3/uL — ABNORMAL HIGH (ref 150–400)
RBC: 4.23 MIL/uL (ref 4.22–5.81)
RDW: 15.3 % (ref 11.5–15.5)
WBC: 5.8 10*3/uL (ref 4.0–10.5)
nRBC: 0 % (ref 0.0–0.2)
nRBC: 0 /100{WBCs}

## 2023-11-24 LAB — COMPREHENSIVE METABOLIC PANEL
ALT: 73 U/L — ABNORMAL HIGH (ref 0–44)
AST: 34 U/L (ref 15–41)
Albumin: 2.1 g/dL — ABNORMAL LOW (ref 3.5–5.0)
Alkaline Phosphatase: 116 U/L (ref 38–126)
Anion gap: 11 (ref 5–15)
BUN: 29 mg/dL — ABNORMAL HIGH (ref 6–20)
CO2: 25 mmol/L (ref 22–32)
Calcium: 8.5 mg/dL — ABNORMAL LOW (ref 8.9–10.3)
Chloride: 106 mmol/L (ref 98–111)
Creatinine, Ser: 1.17 mg/dL (ref 0.61–1.24)
GFR, Estimated: >60 mL/min (ref >60)
Glucose, Bld: 88 mg/dL (ref 70–99)
Potassium: 4.1 mmol/L (ref 3.5–5.1)
Sodium: 142 mmol/L (ref 135–145)
Total Bilirubin: 0.5 mg/dL (ref 0.0–1.2)
Total Protein: 6.5 g/dL (ref 6.5–8.1)

## 2023-11-24 LAB — MAGNESIUM: Magnesium: 2.5 mg/dL — ABNORMAL HIGH (ref 1.7–2.4)

## 2023-11-24 SURGERY — ESOPHAGOGASTRODUODENOSCOPY (EGD) WITH PROPOFOL
Anesthesia: Monitor Anesthesia Care

## 2023-11-24 MED ORDER — SUCRALFATE 1 G PO TABS
1.0000 g | ORAL_TABLET | Freq: Three times a day (TID) | ORAL | Status: DC
  Administered 2023-11-24 – 2023-11-30 (×21): 1 g via ORAL
  Filled 2023-11-24 (×21): qty 1

## 2023-11-24 MED ORDER — PROPOFOL 10 MG/ML IV BOLUS
INTRAVENOUS | Status: DC | PRN
  Administered 2023-11-24: 70 mg via INTRAVENOUS

## 2023-11-24 MED ORDER — IOHEXOL 9 MG/ML PO SOLN
500.0000 mL | ORAL | Status: AC
Start: 2023-11-24 — End: 2023-11-24
  Administered 2023-11-24 (×2): 500 mL via ORAL

## 2023-11-24 MED ORDER — IOHEXOL 350 MG/ML SOLN
55.0000 mL | Freq: Once | INTRAVENOUS | Status: AC | PRN
  Administered 2023-11-24: 55 mL via INTRAVENOUS

## 2023-11-24 MED ORDER — BOOST / RESOURCE BREEZE PO LIQD CUSTOM
1.0000 | Freq: Three times a day (TID) | ORAL | Status: DC
  Administered 2023-11-24 – 2023-11-27 (×8): 1 via ORAL

## 2023-11-24 MED ORDER — LIDOCAINE 2% (20 MG/ML) 5 ML SYRINGE
INTRAMUSCULAR | Status: DC | PRN
  Administered 2023-11-24: 40 mg via INTRAVENOUS

## 2023-11-24 MED ORDER — PROPOFOL 500 MG/50ML IV EMUL
INTRAVENOUS | Status: DC | PRN
  Administered 2023-11-24: 100 ug/kg/min via INTRAVENOUS

## 2023-11-24 SURGICAL SUPPLY — 14 items

## 2023-11-24 NOTE — Anesthesia Postprocedure Evaluation (Signed)
 Anesthesia Post Note  Patient: KAMALI NEPHEW  Procedure(s) Performed: ESOPHAGOGASTRODUODENOSCOPY (EGD) WITH PROPOFOL  BIOPSY     Patient location during evaluation: Endoscopy Anesthesia Type: MAC Level of consciousness: awake and alert Pain management: pain level controlled Vital Signs Assessment: post-procedure vital signs reviewed and stable Respiratory status: spontaneous breathing, nonlabored ventilation, respiratory function stable and patient connected to nasal cannula oxygen Cardiovascular status: blood pressure returned to baseline and stable Postop Assessment: no apparent nausea or vomiting Anesthetic complications: no  No notable events documented.  Last Vitals:  Vitals:   11/24/23 1130 11/24/23 1145  BP: 117/82   Pulse: (!) 52   Resp: 19 18  Temp:  (!) 36.2 C  SpO2: 95%     Last Pain:  Vitals:   11/24/23 1145  TempSrc: Oral  PainSc:                  Jaliana Medellin L Catha Ontko

## 2023-11-24 NOTE — Plan of Care (Signed)
   Problem: Health Behavior/Discharge Planning: Goal: Ability to manage health-related needs will improve Outcome: Progressing   Problem: Pain Management: Goal: General experience of comfort will improve Outcome: Progressing   Problem: Safety: Goal: Ability to remain free from injury will improve Outcome: Progressing

## 2023-11-24 NOTE — Op Note (Signed)
 [facility] Patient Name: Henry Kramer Procedure Date : 11/24/2023 MRN: [id number] Attending MD: Gustav ALONSO Mcgee , MD, [id number] Date of Birth: 06/22/1971 CSN: [id number] Age: 53 Admit Type: Inpatient Procedure:                Upper GI endoscopy Indications:              Endoscopy to confirm suspected neoplastic lesion of                            the stomach seen on previous imaging study,                            Epigastric abdominal pain, Abnormal CT of the GI                            tract Providers:                Gustav ALONSO Mcgee, MD, Darleene Bare, RN, Faustina                            Mbumina, Technician Referring MD:              Medicines:                Monitored Anesthesia Care Complications:            No immediate complications. Estimated Blood Loss:     Estimated blood loss was minimal. Procedure:                Pre-Anesthesia Assessment:                           - Prior to the procedure, a History and Physical                            was performed, and patient medications and                            allergies were reviewed. The patient's tolerance of                            previous anesthesia was also reviewed. The risks                            and benefits of the procedure and the sedation                            options and risks were discussed with the patient.                            All questions were answered, and informed consent                            was obtained. Prior Anticoagulants: The patient has  taken no anticoagulant or antiplatelet agents. ASA                            Grade Assessment: III - A patient with severe                            systemic disease. After reviewing the risks and                            benefits, the patient was deemed in satisfactory                            condition to undergo the procedure.                           After obtaining  informed consent, the endoscope was                            passed under direct vision. Throughout the                            procedure, the patient's blood pressure, pulse, and                            oxygen saturations were monitored continuously. The                            [id number]-H190 ([id number]) Olympus endoscope was introduced                            through the mouth, and advanced to the second part                            of duodenum. The upper GI endoscopy was                            accomplished without difficulty. The patient                            tolerated the procedure well. Scope In: Scope Out: Findings:      The Z-line was regular and was found 40 cm from the incisors.      A 2 cm hiatal hernia was present.      No gross lesions were noted in the entire esophagus.      One non-obstructing non-bleeding cratered gastric ulcer with no stigmata       of bleeding was found in the gastric antrum. The lesion was 15 mm in       largest dimension. There is no evidence of perforation. Biopsies were       taken with a cold forceps for histology.      A large, submucosal, non-circumferential mass with fistula draining       thick viscous white fluid, no bleeding and no stigmata of recent       bleeding was found in the gastric body and on the lesser  curvature of       the stomach. Biopsies were taken with a cold forceps for histology.      Diffuse moderate inflammation characterized by congestion (edema),       erosions, erythema and nodularity was found in the entire examined       stomach. Biopsies were taken with a cold forceps for Helicobacter pylori       testing.      The examined duodenum was normal. Impression:               - Z-line regular, 40 cm from the incisors.                           - 2 cm hiatal hernia.                           - No gross lesions in the entire esophagus.                           - Non-obstructing non-bleeding gastric ulcer  with                            no stigmata of bleeding. There is no evidence of                            perforation. Biopsied.                           - Rule out malignancy, gastric tumor in the gastric                            body and on the lesser curvature of the stomach.                            Biopsied.                           - Gastritis. Biopsied.                           - Normal examined duodenum. Recommendation:           - Patient has a contact number available for                            emergencies. The signs and symptoms of potential                            delayed complications were discussed with the                            patient. Return to normal activities tomorrow.                            Written discharge instructions were provided to the  patient.                           - Clear liquid diet - advance as tolerated to soft                            diet.                           - No aspirin, ibuprofen, naproxen, or other                            non-steroidal anti-inflammatory drugs.                           - Await pathology results.                           - Use sucralfate  suspension 1 gram PO QID for 2                            weeks.                           - Pantoprazole  40mg  IV BID                           - F/u with surgery for further management of                            fistula/submucosal lesion based on biopsy results Procedure Code(s):        --- Professional ---                           [phone], Esophagogastroduodenoscopy, flexible,                            transoral; with biopsy, single or multiple Diagnosis Code(s):        --- Professional ---                           K44.9, Diaphragmatic hernia without obstruction or                            gangrene                           K25.9, Gastric ulcer, unspecified as acute or                            chronic, without hemorrhage  or perforation                           D49.0, Neoplasm of unspecified behavior of                            digestive system  K29.70, Gastritis, unspecified, without bleeding                           R93.3, Abnormal findings on diagnostic imaging of                            other parts of digestive tract                           R10.13, Epigastric pain CPT copyright 2022 [employer]. All rights reserved. The codes documented in this report are preliminary and upon coder review may  be revised to meet current compliance requirements. Phylicia Mcgaugh V. Langford Carias, MD 11/24/2023 11:28:39 AM This report has been signed electronically. Number of Addenda: 0

## 2023-11-24 NOTE — Transfer of Care (Signed)
 Immediate Anesthesia Transfer of Care Note  Patient: Henry Kramer  Procedure(s) Performed: ESOPHAGOGASTRODUODENOSCOPY (EGD) WITH PROPOFOL  BIOPSY  Patient Location: PACU and Endoscopy Unit  Anesthesia Type:MAC  Level of Consciousness: sedated  Airway & Oxygen Therapy: Patient Spontanous Breathing  Post-op Assessment: Report given to RN and Post -op Vital signs reviewed and stable  Post vital signs: Reviewed and stable  Last Vitals:  Vitals Value Taken Time  BP    Temp    Pulse 68 11/24/23 1105  Resp    SpO2 99 % 11/24/23 1105  Vitals shown include unfiled device data.  Last Pain:  Vitals:   11/24/23 0955  TempSrc: Temporal  PainSc: 0-No pain      Patients Stated Pain Goal: 0 (11/23/23 2014)  Complications: No notable events documented.

## 2023-11-24 NOTE — Interval H&P Note (Signed)
 History and Physical Interval Note:  11/24/2023 8:59 AM  Henry Kramer  has presented today for surgery, with the diagnosis of Abnormal Ct Stomach.  The various methods of treatment have been discussed with the patient and family. After consideration of risks, benefits and other options for treatment, the patient has consented to  Procedure(s): ESOPHAGOGASTRODUODENOSCOPY (EGD) WITH PROPOFOL  (N/A) as a surgical intervention.  The patient's history has been reviewed, patient examined, no change in status, stable for surgery.  I have reviewed the patient's chart and labs.  Questions were answered to the patient's satisfaction.     Ewart Carrera

## 2023-11-24 NOTE — Progress Notes (Addendum)
 Subjective: Frustrated about NPO status.  Mother at bedside.   ROS: See above, otherwise other systems negative  Objective: Vital signs in last 24 hours: Temp:  [97.6 F (36.4 C)-97.7 F (36.5 C)] 97.6 F (36.4 C) (01/06 2005) Pulse Rate:  [60-104] 67 (01/06 2014) Resp:  [16-17] 17 (01/06 2005) BP: (101-121)/(61-82) 112/77 (01/06 2005) SpO2:  [94 %-100 %] 97 % (01/06 2014) Weight:  [53.5 kg-54.6 kg] 53.5 kg (01/07 0500) Last BM Date : 11/21/23  Intake/Output from previous day: 01/06 0701 - 01/07 0700 In: -  Out: 250 [Urine:250] Intake/Output this shift: No intake/output data recorded.  PE: Gen: NAD, resting comfortably Heart: regular Lungs: CTAB Abd: soft, mildly tender in epigastrium, ND  Lab Results:  Recent Labs    11/23/23 0452 11/24/23 0434  WBC 8.6 5.8  HGB 8.1* 11.0*  HCT 23.8* 32.7*  PLT 849* 682*   BMET Recent Labs    11/23/23 0452 11/24/23 0434  NA 137 142  K 4.5 4.1  CL 101 106  CO2 22 25  GLUCOSE 127* 88  BUN 24* 29*  CREATININE 0.97 1.17  CALCIUM  8.4* 8.5*   PT/INR No results for input(s): LABPROT, INR in the last 72 hours. CMP     Component Value Date/Time   NA 142 11/24/2023 0434   K 4.1 11/24/2023 0434   CL 106 11/24/2023 0434   CO2 25 11/24/2023 0434   GLUCOSE 88 11/24/2023 0434   BUN 29 (H) 11/24/2023 0434   CREATININE 1.17 11/24/2023 0434   CREATININE 1.11 04/21/2014 1032   CALCIUM  8.5 (L) 11/24/2023 0434   PROT 6.5 11/24/2023 0434   ALBUMIN 2.1 (L) 11/24/2023 0434   AST 34 11/24/2023 0434   ALT 73 (H) 11/24/2023 0434   ALKPHOS 116 11/24/2023 0434   BILITOT 0.5 11/24/2023 0434   GFRNONAA >60 11/24/2023 0434   GFRNONAA 81 04/21/2014 1032   GFRAA >60 03/31/2018 0640   GFRAA >89 04/21/2014 1032   Lipase     Component Value Date/Time   LIPASE 47 11/22/2023 0933       Studies/Results: CT ABDOMEN PELVIS W CONTRAST Result Date: 11/22/2023 CLINICAL DATA:  Duodenal ulcer evaluation for gastric  perforation. EXAM: CT ABDOMEN AND PELVIS WITH CONTRAST TECHNIQUE: Multidetector CT imaging of the abdomen and pelvis was performed using the standard protocol following bolus administration of intravenous contrast. RADIATION DOSE REDUCTION: This exam was performed according to the departmental dose-optimization program which includes automated exposure control, adjustment of the mA and/or kV according to patient size and/or use of iterative reconstruction technique. CONTRAST:  50mL OMNIPAQUE  IOHEXOL  350 MG/ML SOLN COMPARISON:  CT 11/22/2023 at 2:19 p.m. FINDINGS: Lower chest: No acute abnormality. Hepatobiliary: Scattered hemangiomas. No acute abnormality. Normal gallbladder and biliary tree. Pancreas: Unremarkable. Spleen: Unremarkable. Adrenals/Urinary Tract: Normal adrenal glands. No urinary calculi or hydronephrosis. Contrast from prior CT within the ureters and bladder. Stomach/Bowel: Redemonstrated gastric wall thickening about the fundus. Gas and fluid collection with air-fluid level protruding from the gastric fundus measures 5.5 x 4.7 cm, grossly similar to earlier today. Enteric contrast is present within the stomach and small bowel. No discrete oral contrast is visualized within the gas and fluid collection however the overall density has increased compared to earlier today (Hounsfield units 38 versus 8 on the previous study). Soft tissue edema and stranding within the lesser sac. No bowel obstruction.  The appendix is not confidently visualized. Vascular/Lymphatic: No significant vascular findings are present. No enlarged abdominal or pelvic lymph  nodes. Reproductive: Enlarged prostate. Other: No free intraperitoneal air. Musculoskeletal: No acute fracture. IMPRESSION: Findings remain concerning for gastric ulceration with contained perforation. No discrete oral contrast is visualized within the gas and fluid collection however the overall density has increased compared to earlier today which may be due  to dilute oral contrast within the collection. Primary differential consideration is gastric diverticulitis. Electronically Signed   By: Norman Gatlin M.D.   On: 11/22/2023 21:25   CT ABDOMEN PELVIS W CONTRAST Result Date: 11/22/2023 CLINICAL DATA:  Left flank pain. EXAM: CT ABDOMEN AND PELVIS WITH CONTRAST TECHNIQUE: Multidetector CT imaging of the abdomen and pelvis was performed using the standard protocol following bolus administration of intravenous contrast. RADIATION DOSE REDUCTION: This exam was performed according to the departmental dose-optimization program which includes automated exposure control, adjustment of the mA and/or kV according to patient size and/or use of iterative reconstruction technique. CONTRAST:  75mL OMNIPAQUE  IOHEXOL  350 MG/ML SOLN COMPARISON:  MRI 08/11/2016 FINDINGS: Lower chest: No pleural effusion or consolidative change identified. Hepatobiliary: Previously characterized liver hemangiomas are again identified within the anterior right lobe and far lateral left lobe. The left lobe of liver hemangioma measures 2.8 cm, image 1.6/3. Anterior right hepatic lobe hemangioma measures 1.1 cm, image 119/3. Gallbladder appears normal. No bile duct dilatation Pancreas: Unremarkable. No pancreatic ductal dilatation or surrounding inflammatory changes. Spleen: Normal in size without focal abnormality. Adrenals/Urinary Tract: Normal adrenal glands. No nephrolithiasis, hydronephrosis or suspicious mass. Urinary bladder appears normal. Stomach/Bowel: Assessment of bowel pathology is diminished due to lack of enteric contrast material. There is abnormal wall thickening involving the gastric fundus there is a gas and fluid collection between the proximal stomach measuring 6.1 x 1.4 by 4.1 cm. There is an air-fluid level within this fluid collection which may communicate with the lumen of the stomach, image 20/3. This area is difficult to scratch edema and soft tissue stranding is noted within  the lesser sac, there is diffuse edema identified within the lesser sac. The small bowel loops appear nondilated. Scattered mildly dilated loops of small bowel are identified which measure up to 2.2 cm. Lack of enteric contrast material and paucity of visceral fat limits assessment for the appendix which is not confidently identified. Vascular/Lymphatic: Normal caliber of the abdominal aorta. No adenopathy identified. Reproductive: Prostate gland enlargement. Other: No significant free fluid identified. No signs of free perforation. Musculoskeletal: No acute or significant osseous findings. IMPRESSION: 1. There is abnormal wall thickening involving the gastric fundus. There is a gas and fluid collection between the proximal stomach measuring 6.1 x 1.4 x 4.1 cm. There is an air-fluid level within this fluid collection which may communicate with the lumen of the stomach. Findings are concerning for gastric ulceration with contained perforation. Consider further evaluation with repeat CT of the abdomen following oral contrast and IV contrast administration. 2. Scattered mildly dilated loops of small bowel are identified which measure up to 2.2 cm. Findings are favored to represent ileus. 3. Lack of enteric contrast material and paucity of visceral fat limits assessment for the appendix which is not confidently identified. 4. Prostate gland enlargement. 5. Previously characterized liver hemangiomas are again identified. Electronically Signed   By: Waddell Calk M.D.   On: 11/22/2023 15:11   CT Angio Chest PE W and/or Wo Contrast Result Date: 11/22/2023 CLINICAL DATA:  Shortness of breath for 1 week. Rule out pulmonary embolus. EXAM: CT ANGIOGRAPHY CHEST WITH CONTRAST TECHNIQUE: Multidetector CT imaging of the chest was performed using the  standard protocol during bolus administration of intravenous contrast. Multiplanar CT image reconstructions and MIPs were obtained to evaluate the vascular anatomy. RADIATION DOSE  REDUCTION: This exam was performed according to the departmental dose-optimization program which includes automated exposure control, adjustment of the mA and/or kV according to patient size and/or use of iterative reconstruction technique. CONTRAST:  75mL OMNIPAQUE  IOHEXOL  350 MG/ML SOLN COMPARISON:  None Available. FINDINGS: Cardiovascular: Satisfactory opacification of the pulmonary arteries to the segmental level. No evidence of pulmonary embolism. Normal heart size. No pericardial effusion. Mediastinum/Nodes: No enlarged mediastinal, hilar, or axillary lymph nodes. Thyroid  gland, trachea, and esophagus demonstrate no significant findings. Lungs/Pleura: Bilateral increased thickening of the peribronchovascular interstitium. Signs of mucous plugging identified within the lower lobe airways bilaterally. No signs of pleural effusion, airspace consolidation, atelectasis or pneumothorax. No suspicious pulmonary nodule or mass identified bilaterally. Upper Abdomen: No acute abnormality. See report for abdomen and pelvis CT also performed today. Musculoskeletal: No chest wall abnormality. No acute or significant osseous findings. Review of the MIP images confirms the above findings. IMPRESSION: 1. No evidence for acute pulmonary embolus. 2. Bilateral increased thickening of the peribronchovascular interstitium. Signs of mucous plugging identified within the lower lobe airways bilaterally. Findings are nonspecific but may reflect bronchitis. Electronically Signed   By: Waddell Calk M.D.   On: 11/22/2023 14:56   DG Chest 2 View Result Date: 11/22/2023 CLINICAL DATA:  53 year old male with history of shortness of breath. EXAM: CHEST - 2 VIEW COMPARISON:  Chest x-ray 03/08/2008. FINDINGS: Lung volumes are normal. No consolidative airspace disease. No pleural effusions. No pneumothorax. No pulmonary nodule or mass noted. Pulmonary vasculature and the cardiomediastinal silhouette are within normal limits. IMPRESSION: No  radiographic evidence of acute cardiopulmonary disease. Electronically Signed   By: Toribio Aye M.D.   On: 11/22/2023 10:02    Anti-infectives: Anti-infectives (From admission, onward)    Start     Dose/Rate Route Frequency Ordered Stop   11/22/23 2200  piperacillin -tazobactam (ZOSYN ) IVPB 3.375 g        3.375 g 12.5 mL/hr over 240 Minutes Intravenous Every 8 hours 11/22/23 1942     11/22/23 2200  fluconazole  (DIFLUCAN ) IVPB 400 mg        400 mg 100 mL/hr over 120 Minutes Intravenous Every 24 hours 11/22/23 2202     11/22/23 1745  fluconazole  (DIFLUCAN ) tablet 150 mg        150 mg Oral  Once 11/22/23 1735 11/22/23 1758   11/22/23 1530  piperacillin -tazobactam (ZOSYN ) IVPB 3.375 g        3.375 g 100 mL/hr over 30 Minutes Intravenous  Once 11/22/23 1520 11/22/23 1635   11/22/23 0000  azithromycin  (ZITHROMAX ) 250 MG tablet        250 mg Oral Daily 11/22/23 1512     11/22/23 0000  amoxicillin -clavulanate (AUGMENTIN ) 875-125 MG tablet        1 tablet Oral Every 12 hours 11/22/23 1512          Assessment/Plan Perigastric fluid collection, ? Continued gastric perforation -WBC normal, afebrile, hemodynamically stable, no peritonitis  -repeat CT scan from 1/5 reviewed >> no discrete oral contrast but increased density compared to prior scan, cannot exclude oral contrast in fluid collection. -will ask IR to drain this collection to see if we can treat this non-operatively for now - going for upper endoscopy today to better delineate cause of perforation and exclude malignancy -cont IV abx therapy/PPI therapy -cont NPO - [facility] will follow closely with you  FEN - NPO/IVFs VTE - scds ID - [email or web address]   Anemia - unclear etiology, but likely secondary to process in stomach, hgb 11.8 to 8.4, cont to monitor Chronic nausea - ? Related to marijuana use vs gastric process GERD Tobacco abuse - needs to stop smoking at this can lead to ulcer disease as well  I reviewed nursing notes,  hospitalist notes, last 24 h vitals and pain scores, last 48 h intake and output, last 24 h labs and trends, and last 24 h imaging results.   LOS: 2 days    Almarie GORMAN Pringle , [facility] Surgery 11/24/2023, 9:26 AM Please see Amion for pager number during day hours 7:00am-4:30pm or 7:00am -11:30am on weekends

## 2023-11-24 NOTE — Progress Notes (Signed)
 Initial Nutrition Assessment  DOCUMENTATION CODES:   Severe malnutrition in context of acute illness/injury  INTERVENTION:   -Clear liquid diet, diet progression per physician, goal diet GI soft.  -Continue Boost Breeze po TID, each supplement provides 250 kcal and 9 grams of protein, once diet beyond clear liquid, change oral supplement to Ensure Enlive po TID, each supplement provides 350 kcal and 20 grams of protein.  -Recommend thiamine  100 mg daily, folic acid  1 mg daily, MVI/Minerals-1 Tab daily.     NUTRITION DIAGNOSIS:   Severe Malnutrition related to nausea, vomiting, acute illness, chronic illness as evidenced by per patient/family report, percent weight loss.    GOAL:   Patient will meet greater than or equal to 90% of their needs    MONITOR:   PO intake, Weight trends, Supplement acceptance, Diet advancement, Skin, Labs  REASON FOR ASSESSMENT:   Malnutrition Screening Tool  -reported weight loss  ASSESSMENT:  53 y/o male presented with abdominal discomfort and nausea.  PMH: AKI, GERD, chronic nausea, substance abuse-Marijuana and ETOH, homelessness.  01/07-EGD, hiatal hernia, gastric ulcer, large submucosal, non-circumferential mass with fistula-draining.   Clear liquid diet following EGD, per GI physician, advance as tolerated to soft. Per EMR, no recent weight hx data to confirm weight loss. Patient stated usual weight was 140# when he donated blood the week before Christmas.  Acknowledged that he wore extra layers to meet goal weight so may have been closer to approximately 130#.  He was not able to keep solid food down due to nausea and vomiting with stomach pain since Christmas. The only nutrition he received was milk.  Patient started out visit very talkative then became upset with this writer due to comment on alternative viewpoints regarding rationale behind his landlord actions.   Medications reviewed and include PPI, carafate .  Labs reviewed      Diet Order:   Diet Order             Diet clear liquid Fluid consistency: Thin  Diet effective now                   EDUCATION NEEDS:   Not appropriate for education at this time  Skin:  Skin Assessment: Reviewed RN Assessment  Last BM:  11/21/2023  Height:   Ht Readings from Last 1 Encounters:  11/24/23 6' (1.829 m)    Weight:   Wt Readings from Last 1 Encounters:  11/24/23 53.5 kg    Ideal Body Weight:  80.9 kg  BMI:  Body mass index is 16 kg/m.  Estimated Nutritional Needs:   Kcal:  1900-2000 kcal/day  Protein:  70-80 gm/day  Fluid:  1900-2000 mL/day    Elveria Sable, RDLD Clinical Dietitian If unable to reach, please contact RD Inpatient secure chat group between 8 am-4 pm daily

## 2023-11-24 NOTE — Progress Notes (Signed)
 PROGRESS NOTE    Henry Kramer  FMW:[id number] DOB: February 24, 1971 DOA: 11/22/2023 PCP: Scarlett Ronal Caldron, NP   Brief Narrative:  Patient is a 53 year old male past medical history significant for GERD, chronic nausea, substance abuse and homelessness.  Patient has also been on NSAIDs.  Patient was admitted with abdominal discomfort/pain and nausea.  CT of the abdomen was concerning for gastric wall thickening with gas and fluid collection which could reflect contained perforated of gastric ulcer and some concern of malignancy since he lost 40 pounds weight recently as well. Patient is on IV Zosyn  and Diflucan .  GI consulted, plan for EGD.  Assessment & Plan:   Principal Problem:   Gastric ulcer with perforation (HCC)  Contained gastric perforation/gastric mass possible gastric malignancy/gastritis: -History of NSAIDs use.  CT findings as documented.  Seen by general surgery, they engaged GI, patient underwent EGD 11/24/2023, was found to have large ulcer but not actively bleeding, no perforation but large ulcer which was biopsied.  Continue Protonix , Zosyn  and Diflucan .  Await results.   GERD: -history of NSAID use. -Continue IV Protonix .   History of substance abuse and homelessness: No UDS in chart.  I have ordered 1.  DVT prophylaxis: SCDs Start: 11/22/23 1917   Code Status: Full Code  Family Communication: Mother present at bedside.  Plan of care discussed with patient in length and he/she verbalized understanding and agreed with it.  Status is: Inpatient Remains inpatient appropriate because: Just had EGD, will await biopsy results.   Estimated body mass index is 16 kg/m as calculated from the following:   Height as of this encounter: 6' (1.829 m).   Weight as of this encounter: 53.5 kg.    Nutritional Assessment: Body mass index is 16 kg/m.SABRA Seen by dietician.  I agree with the assessment and plan as outlined below: Nutrition Status:        . Skin Assessment: I  have examined the patient's skin and I agree with the wound assessment as performed by the wound care RN as outlined below:    Consultants:  GI and general surgery  Procedures:  As above  Antimicrobials:  Anti-infectives (From admission, onward)    Start     Dose/Rate Route Frequency Ordered Stop   11/22/23 2200  piperacillin -tazobactam (ZOSYN ) IVPB 3.375 g        3.375 g 12.5 mL/hr over 240 Minutes Intravenous Every 8 hours 11/22/23 1942     11/22/23 2200  fluconazole  (DIFLUCAN ) IVPB 400 mg        400 mg 100 mL/hr over 120 Minutes Intravenous Every 24 hours 11/22/23 2202     11/22/23 1745  fluconazole  (DIFLUCAN ) tablet 150 mg        150 mg Oral  Once 11/22/23 1735 11/22/23 1758   11/22/23 1530  piperacillin -tazobactam (ZOSYN ) IVPB 3.375 g        3.375 g 100 mL/hr over 30 Minutes Intravenous  Once 11/22/23 1520 11/22/23 1635   11/22/23 0000  azithromycin  (ZITHROMAX ) 250 MG tablet        250 mg Oral Daily 11/22/23 1512     11/22/23 0000  amoxicillin -clavulanate (AUGMENTIN ) 875-125 MG tablet        1 tablet Oral Every 12 hours 11/22/23 1512           Subjective: Patient seen and examined after returning from EGD.  He had no complaints at all.  Mother was at the bedside.  Objective: Vitals:   11/23/23 1940 11/23/23 2005 11/23/23 2014  11/24/23 0500  BP:  112/77    Pulse: 60  67   Resp:  17    Temp:  97.6 F (36.4 C)    TempSrc:  Oral    SpO2: 96%  97%   Weight:    53.5 kg  Height:        Intake/Output Summary (Last 24 hours) at 11/24/2023 0818 Last data filed at 11/24/2023 0810 Gross per 24 hour  Intake 0 ml  Output 250 ml  Net -250 ml   Filed Weights   11/23/23 1233 11/24/23 0500  Weight: 54.6 kg 53.5 kg    Examination:  General exam: Appears calm and comfortable  Respiratory system: Clear to auscultation. Respiratory effort normal. Cardiovascular system: S1 & S2 heard, RRR. No JVD, murmurs, rubs, gallops or clicks. No pedal edema. Gastrointestinal system:  Abdomen is nondistended, soft and nontender. No organomegaly or masses felt. Normal bowel sounds heard. Central nervous system: Alert and oriented. No focal neurological deficits. Extremities: Symmetric 5 x 5 power. Skin: No rashes, lesions or ulcers Psychiatry: Judgement and insight appear normal. Mood & affect appropriate.    Data Reviewed: I have personally reviewed following labs and imaging studies  CBC: Recent Labs  Lab 11/22/23 0933 11/23/23 0452 11/24/23 0434  WBC 18.9* 8.6 5.8  NEUTROABS  --   --  3.0  HGB 11.8* 8.1* 11.0*  HCT 34.4* 23.8* 32.7*  MCV 78.2* 78.3* 77.3*  PLT 813* 849* 682*   Basic Metabolic Panel: Recent Labs  Lab 11/22/23 0933 11/23/23 0452 11/24/23 0434  NA 140 137 142  K 4.5 4.5 4.1  CL 101 101 106  CO2 27 22 25   GLUCOSE 95 127* 88  BUN 25* 24* 29*  CREATININE 0.96 0.97 1.17  CALCIUM  8.9 8.4* 8.5*  MG  --   --  2.5*   GFR: Estimated Creatinine Clearance: 55.3 mL/min (by C-G formula based on SCr of 1.17 mg/dL). Liver Function Tests: Recent Labs  Lab 11/22/23 0933 11/23/23 0452 11/24/23 0434  AST 54* 53* 34  ALT 91* 87* 73*  ALKPHOS 136* 134* 116  BILITOT 0.3 0.4 0.5  PROT 7.5 6.7 6.5  ALBUMIN 2.4* 2.2* 2.1*   Recent Labs  Lab 11/22/23 0933  LIPASE 47   No results for input(s): AMMONIA in the last 168 hours. Coagulation Profile: No results for input(s): INR, PROTIME in the last 168 hours. Cardiac Enzymes: No results for input(s): CKTOTAL, CKMB, CKMBINDEX, TROPONINI in the last 168 hours. BNP (last 3 results) No results for input(s): PROBNP in the last 8760 hours. HbA1C: No results for input(s): HGBA1C in the last 72 hours. CBG: Recent Labs  Lab 11/23/23 1637  GLUCAP 94   Lipid Profile: No results for input(s): CHOL, HDL, LDLCALC, TRIG, CHOLHDL, LDLDIRECT in the last 72 hours. Thyroid  Function Tests: No results for input(s): TSH, T4TOTAL, FREET4, T3FREE, THYROIDAB in the last 72  hours. Anemia Panel: No results for input(s): VITAMINB12, FOLATE, FERRITIN, TIBC, IRON, RETICCTPCT in the last 72 hours. Sepsis Labs: No results for input(s): PROCALCITON, LATICACIDVEN in the last 168 hours.  No results found for this or any previous visit (from the past 240 hours).   Radiology Studies: CT ABDOMEN PELVIS W CONTRAST Result Date: 11/22/2023 CLINICAL DATA:  Duodenal ulcer evaluation for gastric perforation. EXAM: CT ABDOMEN AND PELVIS WITH CONTRAST TECHNIQUE: Multidetector CT imaging of the abdomen and pelvis was performed using the standard protocol following bolus administration of intravenous contrast. RADIATION DOSE REDUCTION: This exam was performed according to  the departmental dose-optimization program which includes automated exposure control, adjustment of the mA and/or kV according to patient size and/or use of iterative reconstruction technique. CONTRAST:  50mL OMNIPAQUE  IOHEXOL  350 MG/ML SOLN COMPARISON:  CT 11/22/2023 at 2:19 p.m. FINDINGS: Lower chest: No acute abnormality. Hepatobiliary: Scattered hemangiomas. No acute abnormality. Normal gallbladder and biliary tree. Pancreas: Unremarkable. Spleen: Unremarkable. Adrenals/Urinary Tract: Normal adrenal glands. No urinary calculi or hydronephrosis. Contrast from prior CT within the ureters and bladder. Stomach/Bowel: Redemonstrated gastric wall thickening about the fundus. Gas and fluid collection with air-fluid level protruding from the gastric fundus measures 5.5 x 4.7 cm, grossly similar to earlier today. Enteric contrast is present within the stomach and small bowel. No discrete oral contrast is visualized within the gas and fluid collection however the overall density has increased compared to earlier today (Hounsfield units 38 versus 8 on the previous study). Soft tissue edema and stranding within the lesser sac. No bowel obstruction.  The appendix is not confidently visualized. Vascular/Lymphatic: No  significant vascular findings are present. No enlarged abdominal or pelvic lymph nodes. Reproductive: Enlarged prostate. Other: No free intraperitoneal air. Musculoskeletal: No acute fracture. IMPRESSION: Findings remain concerning for gastric ulceration with contained perforation. No discrete oral contrast is visualized within the gas and fluid collection however the overall density has increased compared to earlier today which may be due to dilute oral contrast within the collection. Primary differential consideration is gastric diverticulitis. Electronically Signed   By: Norman Gatlin M.D.   On: 11/22/2023 21:25   CT ABDOMEN PELVIS W CONTRAST Result Date: 11/22/2023 CLINICAL DATA:  Left flank pain. EXAM: CT ABDOMEN AND PELVIS WITH CONTRAST TECHNIQUE: Multidetector CT imaging of the abdomen and pelvis was performed using the standard protocol following bolus administration of intravenous contrast. RADIATION DOSE REDUCTION: This exam was performed according to the departmental dose-optimization program which includes automated exposure control, adjustment of the mA and/or kV according to patient size and/or use of iterative reconstruction technique. CONTRAST:  75mL OMNIPAQUE  IOHEXOL  350 MG/ML SOLN COMPARISON:  MRI 08/11/2016 FINDINGS: Lower chest: No pleural effusion or consolidative change identified. Hepatobiliary: Previously characterized liver hemangiomas are again identified within the anterior right lobe and far lateral left lobe. The left lobe of liver hemangioma measures 2.8 cm, image 1.6/3. Anterior right hepatic lobe hemangioma measures 1.1 cm, image 119/3. Gallbladder appears normal. No bile duct dilatation Pancreas: Unremarkable. No pancreatic ductal dilatation or surrounding inflammatory changes. Spleen: Normal in size without focal abnormality. Adrenals/Urinary Tract: Normal adrenal glands. No nephrolithiasis, hydronephrosis or suspicious mass. Urinary bladder appears normal. Stomach/Bowel:  Assessment of bowel pathology is diminished due to lack of enteric contrast material. There is abnormal wall thickening involving the gastric fundus there is a gas and fluid collection between the proximal stomach measuring 6.1 x 1.4 by 4.1 cm. There is an air-fluid level within this fluid collection which may communicate with the lumen of the stomach, image 20/3. This area is difficult to scratch edema and soft tissue stranding is noted within the lesser sac, there is diffuse edema identified within the lesser sac. The small bowel loops appear nondilated. Scattered mildly dilated loops of small bowel are identified which measure up to 2.2 cm. Lack of enteric contrast material and paucity of visceral fat limits assessment for the appendix which is not confidently identified. Vascular/Lymphatic: Normal caliber of the abdominal aorta. No adenopathy identified. Reproductive: Prostate gland enlargement. Other: No significant free fluid identified. No signs of free perforation. Musculoskeletal: No acute or significant osseous findings. IMPRESSION:  1. There is abnormal wall thickening involving the gastric fundus. There is a gas and fluid collection between the proximal stomach measuring 6.1 x 1.4 x 4.1 cm. There is an air-fluid level within this fluid collection which may communicate with the lumen of the stomach. Findings are concerning for gastric ulceration with contained perforation. Consider further evaluation with repeat CT of the abdomen following oral contrast and IV contrast administration. 2. Scattered mildly dilated loops of small bowel are identified which measure up to 2.2 cm. Findings are favored to represent ileus. 3. Lack of enteric contrast material and paucity of visceral fat limits assessment for the appendix which is not confidently identified. 4. Prostate gland enlargement. 5. Previously characterized liver hemangiomas are again identified. Electronically Signed   By: Waddell Calk M.D.   On:  11/22/2023 15:11   CT Angio Chest PE W and/or Wo Contrast Result Date: 11/22/2023 CLINICAL DATA:  Shortness of breath for 1 week. Rule out pulmonary embolus. EXAM: CT ANGIOGRAPHY CHEST WITH CONTRAST TECHNIQUE: Multidetector CT imaging of the chest was performed using the standard protocol during bolus administration of intravenous contrast. Multiplanar CT image reconstructions and MIPs were obtained to evaluate the vascular anatomy. RADIATION DOSE REDUCTION: This exam was performed according to the departmental dose-optimization program which includes automated exposure control, adjustment of the mA and/or kV according to patient size and/or use of iterative reconstruction technique. CONTRAST:  75mL OMNIPAQUE  IOHEXOL  350 MG/ML SOLN COMPARISON:  None Available. FINDINGS: Cardiovascular: Satisfactory opacification of the pulmonary arteries to the segmental level. No evidence of pulmonary embolism. Normal heart size. No pericardial effusion. Mediastinum/Nodes: No enlarged mediastinal, hilar, or axillary lymph nodes. Thyroid  gland, trachea, and esophagus demonstrate no significant findings. Lungs/Pleura: Bilateral increased thickening of the peribronchovascular interstitium. Signs of mucous plugging identified within the lower lobe airways bilaterally. No signs of pleural effusion, airspace consolidation, atelectasis or pneumothorax. No suspicious pulmonary nodule or mass identified bilaterally. Upper Abdomen: No acute abnormality. See report for abdomen and pelvis CT also performed today. Musculoskeletal: No chest wall abnormality. No acute or significant osseous findings. Review of the MIP images confirms the above findings. IMPRESSION: 1. No evidence for acute pulmonary embolus. 2. Bilateral increased thickening of the peribronchovascular interstitium. Signs of mucous plugging identified within the lower lobe airways bilaterally. Findings are nonspecific but may reflect bronchitis. Electronically Signed   By:  Waddell Calk M.D.   On: 11/22/2023 14:56   DG Chest 2 View Result Date: 11/22/2023 CLINICAL DATA:  53 year old male with history of shortness of breath. EXAM: CHEST - 2 VIEW COMPARISON:  Chest x-ray 03/08/2008. FINDINGS: Lung volumes are normal. No consolidative airspace disease. No pleural effusions. No pneumothorax. No pulmonary nodule or mass noted. Pulmonary vasculature and the cardiomediastinal silhouette are within normal limits. IMPRESSION: No radiographic evidence of acute cardiopulmonary disease. Electronically Signed   By: Toribio Aye M.D.   On: 11/22/2023 10:02    Scheduled Meds:  influenza vac split trivalent PF  0.5 mL Intramuscular Tomorrow-1000   pantoprazole  (PROTONIX ) IV  40 mg Intravenous Q12H   pneumococcal 20-valent conjugate vaccine  0.5 mL Intramuscular Tomorrow-1000   sodium chloride  flush  3 mL Intravenous Q12H   Continuous Infusions:  sodium chloride  20 mL/hr at 11/23/23 1452   fluconazole  (DIFLUCAN ) IV 400 mg (11/23/23 2111)   piperacillin -tazobactam (ZOSYN )  IV 3.375 g (11/24/23 0535)     LOS: 2 days   Fredia Skeeter, MD [facility]  11/24/2023, 8:18 AM   *Please note that this is a verbal  dictation therefore any spelling or grammatical errors are due to the Dragon Medical One system interpretation.  Please page via Amion and do not message via secure chat for urgent patient care matters. Secure chat can be used for non urgent patient care matters.  How to contact the [facility] Attending or Consulting provider 7A - 7P or covering provider during after hours 7P -7A, for this patient?  Check the care team in [facility] and look for a) attending/consulting [facility] provider listed and b) the [facility] team listed. Page or secure chat 7A-7P. Log into [email or web address] and use [place]'s universal password to access. If you do not have the password, please contact the hospital operator. Locate the [facility] provider you are looking for under [facility] and page to a number  that you can be directly reached. If you still have difficulty reaching the provider, please page the [facility] (Director on Call) for the Hospitalists listed on amion for assistance.

## 2023-11-25 ENCOUNTER — Inpatient Hospital Stay (HOSPITAL_COMMUNITY): Payer: Medicaid Other

## 2023-11-25 DIAGNOSIS — R935 Abnormal findings on diagnostic imaging of other abdominal regions, including retroperitoneum: Secondary | ICD-10-CM

## 2023-11-25 DIAGNOSIS — G8929 Other chronic pain: Secondary | ICD-10-CM

## 2023-11-25 DIAGNOSIS — R933 Abnormal findings on diagnostic imaging of other parts of digestive tract: Secondary | ICD-10-CM

## 2023-11-25 DIAGNOSIS — R634 Abnormal weight loss: Secondary | ICD-10-CM

## 2023-11-25 LAB — COMPREHENSIVE METABOLIC PANEL
ALT: 54 U/L — ABNORMAL HIGH (ref 0–44)
AST: 20 U/L (ref 15–41)
Albumin: 2 g/dL — ABNORMAL LOW (ref 3.5–5.0)
Alkaline Phosphatase: 99 U/L (ref 38–126)
Anion gap: 7 (ref 5–15)
BUN: 19 mg/dL (ref 6–20)
CO2: 26 mmol/L (ref 22–32)
Calcium: 8.1 mg/dL — ABNORMAL LOW (ref 8.9–10.3)
Chloride: 103 mmol/L (ref 98–111)
Creatinine, Ser: 1.04 mg/dL (ref 0.61–1.24)
GFR, Estimated: >60 mL/min (ref >60)
Glucose, Bld: 113 mg/dL — ABNORMAL HIGH (ref 70–99)
Potassium: 3.6 mmol/L (ref 3.5–5.1)
Sodium: 136 mmol/L (ref 135–145)
Total Bilirubin: 0.3 mg/dL (ref 0.0–1.2)
Total Protein: 6.1 g/dL — ABNORMAL LOW (ref 6.5–8.1)

## 2023-11-25 LAB — CBC
HCT: 31.8 % — ABNORMAL LOW (ref 39.0–52.0)
Hemoglobin: 10.9 g/dL — ABNORMAL LOW (ref 13.0–17.0)
MCH: 26.5 pg (ref 26.0–34.0)
MCHC: 34.3 g/dL (ref 30.0–36.0)
MCV: 77.2 fL — ABNORMAL LOW (ref 80.0–100.0)
Platelets: 646 10*3/uL — ABNORMAL HIGH (ref 150–400)
RBC: 4.12 MIL/uL — ABNORMAL LOW (ref 4.22–5.81)
RDW: 15.3 % (ref 11.5–15.5)
WBC: 4.7 10*3/uL (ref 4.0–10.5)
nRBC: 0 % (ref 0.0–0.2)

## 2023-11-25 MED ORDER — MIDAZOLAM HCL 2 MG/2ML IJ SOLN
INTRAMUSCULAR | Status: AC | PRN
  Administered 2023-11-25 (×3): .5 mg via INTRAVENOUS

## 2023-11-25 MED ORDER — FENTANYL CITRATE (PF) 100 MCG/2ML IJ SOLN
INTRAMUSCULAR | Status: AC | PRN
  Administered 2023-11-25: 50 ug via INTRAVENOUS

## 2023-11-25 MED ORDER — LIDOCAINE HCL 1 % IJ SOLN
10.0000 mL | Freq: Once | INTRAMUSCULAR | Status: AC
  Administered 2023-11-25: 10 mL via INTRADERMAL

## 2023-11-25 MED ORDER — MIDAZOLAM HCL 2 MG/2ML IJ SOLN
INTRAMUSCULAR | Status: AC
Start: 2023-11-25 — End: ?
  Filled 2023-11-25: qty 2

## 2023-11-25 MED ORDER — FENTANYL CITRATE (PF) 100 MCG/2ML IJ SOLN
INTRAMUSCULAR | Status: AC
  Filled 2023-11-25: qty 2

## 2023-11-25 NOTE — Plan of Care (Signed)
  Problem: Clinical Measurements: Goal: Ability to maintain clinical measurements within normal limits will improve Outcome: Progressing Goal: Respiratory complications will improve Outcome: Progressing Goal: Cardiovascular complication will be avoided Outcome: Progressing   Problem: Nutrition: Goal: Adequate nutrition will be maintained Outcome: Progressing   Problem: Safety: Goal: Ability to remain free from injury will improve Outcome: Progressing

## 2023-11-25 NOTE — Progress Notes (Signed)
 CSW received consult for patient. CSW spoke with patient. Patient request for CSW to reach out to financial counseling to screen him for disability. CSW emailed S. Joshua in financial counseling and requested if patient can be screened for disability. All questions answered. No further questions reported at this time.

## 2023-11-25 NOTE — Procedures (Addendum)
 Interventional Radiology Procedure Note  Procedure:   CT guided aspiration of LUQ fluid associated with gastric wall.  ~40cc of fluid aspirated for Cx and cyto.   Complications: SIR b -- small left basilar PTX Sample: ~40cc of fluid aspirated for Cx and cyto.   Recommendations:  - Routine wound care - follow up samples - will order clear liquids per Sx conversation - we will order cXR in am for PTX follow up. If any status changes overnight, page VIR on call  Signed,  Ami RAMAN. [place], DO, ABVM, RPVI

## 2023-11-25 NOTE — Progress Notes (Signed)
 PROGRESS NOTE    Henry Kramer  FMW:[id number] DOB: Oct 15, 1971 DOA: 11/22/2023 PCP: Scarlett Ronal Caldron, NP   Brief Narrative:  Patient is a 53 year old male past medical history significant for GERD, chronic nausea, substance abuse and homelessness.  Patient has also been on NSAIDs.  Patient was admitted with abdominal discomfort/pain and nausea.  CT of the abdomen was concerning for gastric wall thickening with gas and fluid collection which could reflect contained perforated of gastric ulcer and some concern of malignancy since he lost 40 pounds weight recently as well. Patient is on IV Zosyn  and Diflucan .  GI consulted, plan for EGD.  Assessment & Plan:   Principal Problem:   Gastric ulcer with perforation (HCC) Active Problems:   Abdominal pain, chronic, epigastric   Loss of weight   Abnormal CT scan, gastrointestinal tract  Contained gastric perforation/gastric mass possible gastric malignancy/gastritis: -History of NSAIDs use.  CT findings as documented.  Seen by general surgery, they engaged GI, patient underwent EGD 11/24/2023, was found to have large nonbleeding ulcer but no perforation.  Also had large mass which was biopsied.  Continue Protonix , Zosyn  and Diflucan .  Await results.  Underwent CT with contrast 11/24/2023 showing contained perforation anterior to the proximal stomach, general surgery consulted IR for aspiration.  Patient has been n.p.o. overnight.   GERD: -history of NSAID use. -Continue IV Protonix .   History of substance abuse and homelessness: No UDS in chart.  I have ordered 1.  DVT prophylaxis: SCDs Start: 11/22/23 1917   Code Status: Full Code  Family Communication: Mother present at bedside.  Plan of care discussed with patient in length and he/she verbalized understanding and agreed with it.  Status is: Inpatient Remains inpatient appropriate because: Needs aspiration of the contained perforation site.   Estimated body mass index is 16.55 kg/m as  calculated from the following:   Height as of this encounter: 6' (1.829 m).   Weight as of this encounter: 55.3 kg.    Nutritional Assessment: Body mass index is 16.55 kg/m.SABRA Seen by dietician.  I agree with the assessment and plan as outlined below: Nutrition Status: Nutrition Problem: Severe Malnutrition Etiology: nausea, vomiting, acute illness, chronic illness Signs/Symptoms: per patient/family report, percent weight loss Percent weight loss: 9 % Interventions: Boost Breeze  . Skin Assessment: I have examined the patient's skin and I agree with the wound assessment as performed by the wound care RN as outlined below:    Consultants:  GI and general surgery  Procedures:  As above  Antimicrobials:  Anti-infectives (From admission, onward)    Start     Dose/Rate Route Frequency Ordered Stop   11/22/23 2200  piperacillin -tazobactam (ZOSYN ) IVPB 3.375 g        3.375 g 12.5 mL/hr over 240 Minutes Intravenous Every 8 hours 11/22/23 1942     11/22/23 2200  fluconazole  (DIFLUCAN ) IVPB 400 mg        400 mg 100 mL/hr over 120 Minutes Intravenous Every 24 hours 11/22/23 2202     11/22/23 1745  fluconazole  (DIFLUCAN ) tablet 150 mg        150 mg Oral  Once 11/22/23 1735 11/22/23 1758   11/22/23 1530  piperacillin -tazobactam (ZOSYN ) IVPB 3.375 g        3.375 g 100 mL/hr over 30 Minutes Intravenous  Once 11/22/23 1520 11/22/23 1635   11/22/23 0000  azithromycin  (ZITHROMAX ) 250 MG tablet        250 mg Oral Daily 11/22/23 1512  11/22/23 0000  amoxicillin -clavulanate (AUGMENTIN ) 875-125 MG tablet        1 tablet Oral Every 12 hours 11/22/23 1512           Subjective: Patient seen and examined.  He was extremely upset this morning.  Would not even respond to my  good morning greetings this morning.  When I asked him what makes him upset, he told me that he is upset that he is n.p.o. and  nobody knows what is going on with me.  I calmed him down and explained him why he is  n.p.o./for aspiration today.  What we found on the EGD yesterday.  He continues to claim that  nobody talks to me and they just stop my food altogether, I am not going to talk to nobody.  Objective: Vitals:   11/24/23 1130 11/24/23 1145 11/24/23 1948 11/25/23 0506  BP: 117/82  119/77 98/61  Pulse: (!) 52  (!) 48 (!) 52  Resp: 19 18 18 18   Temp:  (!) 97.2 F (36.2 C) (!) 97.4 F (36.3 C) 98.5 F (36.9 C)  TempSrc:  Oral Oral Oral  SpO2: 95%  99% 98%  Weight:    55.3 kg  Height:        Intake/Output Summary (Last 24 hours) at 11/25/2023 1311 Last data filed at 11/24/2023 1831 Gross per 24 hour  Intake 165.79 ml  Output --  Net 165.79 ml   Filed Weights   11/24/23 0500 11/24/23 0955 11/25/23 0506  Weight: 53.5 kg 53.5 kg 55.3 kg    Examination:  General exam: Appears upset Respiratory system: Clear to auscultation. Respiratory effort normal. Cardiovascular system: S1 & S2 heard, RRR. No JVD, murmurs, rubs, gallops or clicks. No pedal edema. Gastrointestinal system: Abdomen is nondistended, soft and nontender. No organomegaly or masses felt. Normal bowel sounds heard. Central nervous system: Alert and oriented. No focal neurological deficits. Extremities: Symmetric 5 x 5 power. Skin: No rashes, lesions or ulcers.  Psychiatry: Judgement and insight appear poor, mood angry.  Data Reviewed: I have personally reviewed following labs and imaging studies  CBC: Recent Labs  Lab 11/22/23 0933 11/23/23 0452 11/24/23 0434 11/25/23 0431  WBC 18.9* 8.6 5.8 4.7  NEUTROABS  --   --  3.0  --   HGB 11.8* 8.1* 11.0* 10.9*  HCT 34.4* 23.8* 32.7* 31.8*  MCV 78.2* 78.3* 77.3* 77.2*  PLT 813* 849* 682* 646*   Basic Metabolic Panel: Recent Labs  Lab 11/22/23 0933 11/23/23 0452 11/24/23 0434 11/25/23 0431  NA 140 137 142 136  K 4.5 4.5 4.1 3.6  CL 101 101 106 103  CO2 27 22 25 26   GLUCOSE 95 127* 88 113*  BUN 25* 24* 29* 19  CREATININE 0.96 0.97 1.17 1.04  CALCIUM  8.9 8.4*  8.5* 8.1*  MG  --   --  2.5*  --    GFR: Estimated Creatinine Clearance: 64.3 mL/min (by C-G formula based on SCr of 1.04 mg/dL). Liver Function Tests: Recent Labs  Lab 11/22/23 0933 11/23/23 0452 11/24/23 0434 11/25/23 0431  AST 54* 53* 34 20  ALT 91* 87* 73* 54*  ALKPHOS 136* 134* 116 99  BILITOT 0.3 0.4 0.5 0.3  PROT 7.5 6.7 6.5 6.1*  ALBUMIN 2.4* 2.2* 2.1* 2.0*   Recent Labs  Lab 11/22/23 0933  LIPASE 47   No results for input(s): AMMONIA in the last 168 hours. Coagulation Profile: No results for input(s): INR, PROTIME in the last 168 hours. Cardiac Enzymes: No  results for input(s): CKTOTAL, CKMB, CKMBINDEX, TROPONINI in the last 168 hours. BNP (last 3 results) No results for input(s): PROBNP in the last 8760 hours. HbA1C: No results for input(s): HGBA1C in the last 72 hours. CBG: Recent Labs  Lab 11/23/23 1637  GLUCAP 94   Lipid Profile: No results for input(s): CHOL, HDL, LDLCALC, TRIG, CHOLHDL, LDLDIRECT in the last 72 hours. Thyroid  Function Tests: No results for input(s): TSH, T4TOTAL, FREET4, T3FREE, THYROIDAB in the last 72 hours. Anemia Panel: No results for input(s): VITAMINB12, FOLATE, FERRITIN, TIBC, IRON, RETICCTPCT in the last 72 hours. Sepsis Labs: No results for input(s): PROCALCITON, LATICACIDVEN in the last 168 hours.  No results found for this or any previous visit (from the past 240 hours).   Radiology Studies: CT CHEST ABDOMEN PELVIS W CONTRAST Result Date: 11/24/2023 CLINICAL DATA:  Ulcerated gastric mass, metastatic disease evaluation. * Tracking Code: BO * EXAM: CT CHEST, ABDOMEN, AND PELVIS WITH CONTRAST TECHNIQUE: Multidetector CT imaging of the chest, abdomen and pelvis was performed following the standard protocol during bolus administration of intravenous contrast. RADIATION DOSE REDUCTION: This exam was performed according to the departmental dose-optimization program which  includes automated exposure control, adjustment of the mA and/or kV according to patient size and/or use of iterative reconstruction technique. CONTRAST:  55mL OMNIPAQUE  IOHEXOL  350 MG/ML SOLN COMPARISON:  CT examinations of the chest, abdomen, and pelvis from 11/22/2023 FINDINGS: CT CHEST FINDINGS Cardiovascular: Unremarkable Mediastinum/Nodes: Unremarkable Lungs/Pleura: Airway thickening is present, suggesting bronchitis or reactive airways disease. Substantial airway plugging and occlusion in both lower lobes likely from mucus. No focal lung mass or suspicious lung nodule identified. Musculoskeletal: Unremarkable CT ABDOMEN PELVIS FINDINGS Streak artifact from arm positioning mildly reduces diagnostic sensitivity. Hepatobiliary: The abdomen is imaged in the early arterial phase, prior to hepatic venous or portal venous opacification. 2.6 cm arterial phase enhancing lesion in the lateral segment left hepatic lobe on image 57 of series 3 is technically nonspecific on today's examination, but was present and thought to represent a hemangioma on 08/11/2016. Similarly a 2.0 cm lesion anteriorly in the left hepatic lobe on image 61 of series 3 was likewise present previously and probably a hemangioma. Gallbladder unremarkable. Pancreas: Unremarkable Spleen: Unremarkable Adrenals/Urinary Tract: Unremarkable Stomach/Bowel: Irregular gastric wall thickening proximally noted with a contained 5.0 by 4.1 by 3.8 cm collection anterior to the proximal stomach on image 56 series 3. Locally contained perforation a gastric mass is the top differential diagnostic consideration. There is also some gastric wall thickening near the junction of the stomach and antrum. This may correspond to the antral ulceration seen on endoscopy. There is a paucity of adipose tissue in the abdomen. Orally administered contrast medium extends through to the rectum. Vascular/Lymphatic: No significant atherosclerosis. No pathologic adenopathy  identified. Reproductive: Mild prostatomegaly. Other: No supplemental non-categorized findings. 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 Irregular gastric wall thickening proximally with a contained 5.0 by 4.1 by 3.8 cm collection anterior to the proximal stomach. Locally contained perforation related to a gastric mass is the top differential diagnostic consideration. There is also some gastric wall thickening near the junction of the stomach and antrum. This may correspond to the antral ulceration seen on endoscopy. 2. No findings of metastatic disease identified. Left hepatic lobe enhancing lesions are attributable to the hemangiomas shown on the 08/11/2016 MRI. 3. Airway thickening is present, suggesting bronchitis or reactive airways disease. Substantial airway plugging and occlusion in both lower lobes likely from mucus. 4. Mild prostatomegaly. 5. Degenerative  disc disease at L3-4. Electronically Signed   By: Ryan Salvage M.D.   On: 11/24/2023 21:24    Scheduled Meds:  feeding supplement  1 Container Oral TID BM   pantoprazole  (PROTONIX ) IV  40 mg Intravenous Q12H   sodium chloride  flush  3 mL Intravenous Q12H   sucralfate   1 g Oral TID WC & HS   Continuous Infusions:  fluconazole  (DIFLUCAN ) IV 400 mg (11/24/23 2124)   piperacillin -tazobactam (ZOSYN )  IV 3.375 g (11/25/23 0555)     LOS: 3 days   Fredia Skeeter, MD [facility]  11/25/2023, 1:11 PM   *Please note that this is a verbal dictation therefore any spelling or grammatical errors are due to the Dragon Medical One system interpretation.  Please page via Amion and do not message via secure chat for urgent patient care matters. Secure chat can be used for non urgent patient care matters.  How to contact the [facility] Attending or Consulting provider 7A - 7P or covering provider during after hours 7P -7A, for this patient?  Check the care team in [facility] and look for a) attending/consulting [facility] provider  listed and b) the [facility] team listed. Page or secure chat 7A-7P. Log into [email or web address] and use 's universal password to access. If you do not have the password, please contact the hospital operator. Locate the [facility] provider you are looking for under [facility] and page to a number that you can be directly reached. If you still have difficulty reaching the provider, please page the [facility] (Director on Call) for the Hospitalists listed on amion for assistance.

## 2023-11-25 NOTE — Progress Notes (Signed)
 Subjective: Patient with his head under the covers. Refusing to speak to me or let me examine him. Per RN he had some mild nausea this AM and he is frustrated about NPO status.  ROS: See above, otherwise other systems negative  Objective: Vital signs in last 24 hours: Temp:  [97.2 F (36.2 C)-98.5 F (36.9 C)] 98.5 F (36.9 C) (01/08 0506) Pulse Rate:  [48-68] 52 (01/08 0506) Resp:  [15-19] 18 (01/08 0506) BP: (91-119)/(61-82) 98/61 (01/08 0506) SpO2:  [95 %-99 %] 98 % (01/08 0506) Weight:  [55.3 kg] 55.3 kg (01/08 0506) Last BM Date : 11/21/23  Intake/Output from previous day: 01/07 0701 - 01/08 0700 In: 1098.3 [P.O.:120; I.V.:300; IV Piggyback:678.3] Out: 1250 [Urine:1250] Intake/Output this shift: No intake/output data recorded.  PE: Gen: NAD, laying under covers Heart: regular Lungs: CTAB Abd: pt refused exam  Lab Results:  Recent Labs    11/24/23 0434 11/25/23 0431  WBC 5.8 4.7  HGB 11.0* 10.9*  HCT 32.7* 31.8*  PLT 682* 646*   BMET Recent Labs    11/24/23 0434 11/25/23 0431  NA 142 136  K 4.1 3.6  CL 106 103  CO2 25 26  GLUCOSE 88 113*  BUN 29* 19  CREATININE 1.17 1.04  CALCIUM  8.5* 8.1*   PT/INR No results for input(s): LABPROT, INR in the last 72 hours. CMP     Component Value Date/Time   NA 136 11/25/2023 0431   K 3.6 11/25/2023 0431   CL 103 11/25/2023 0431   CO2 26 11/25/2023 0431   GLUCOSE 113 (H) 11/25/2023 0431   BUN 19 11/25/2023 0431   CREATININE 1.04 11/25/2023 0431   CREATININE 1.11 04/21/2014 1032   CALCIUM  8.1 (L) 11/25/2023 0431   PROT 6.1 (L) 11/25/2023 0431   ALBUMIN 2.0 (L) 11/25/2023 0431   AST 20 11/25/2023 0431   ALT 54 (H) 11/25/2023 0431   ALKPHOS 99 11/25/2023 0431   BILITOT 0.3 11/25/2023 0431   GFRNONAA >60 11/25/2023 0431   GFRNONAA 81 04/21/2014 1032   GFRAA >60 03/31/2018 0640   GFRAA >89 04/21/2014 1032   Lipase     Component Value Date/Time   LIPASE 47 11/22/2023 0933        Studies/Results: CT CHEST ABDOMEN PELVIS W CONTRAST Result Date: 11/24/2023 CLINICAL DATA:  Ulcerated gastric mass, metastatic disease evaluation. * Tracking Code: BO * EXAM: CT CHEST, ABDOMEN, AND PELVIS WITH CONTRAST TECHNIQUE: Multidetector CT imaging of the chest, abdomen and pelvis was performed following the standard protocol during bolus administration of intravenous contrast. RADIATION DOSE REDUCTION: This exam was performed according to the departmental dose-optimization program which includes automated exposure control, adjustment of the mA and/or kV according to patient size and/or use of iterative reconstruction technique. CONTRAST:  55mL OMNIPAQUE  IOHEXOL  350 MG/ML SOLN COMPARISON:  CT examinations of the chest, abdomen, and pelvis from 11/22/2023 FINDINGS: CT CHEST FINDINGS Cardiovascular: Unremarkable Mediastinum/Nodes: Unremarkable Lungs/Pleura: Airway thickening is present, suggesting bronchitis or reactive airways disease. Substantial airway plugging and occlusion in both lower lobes likely from mucus. No focal lung mass or suspicious lung nodule identified. Musculoskeletal: Unremarkable CT ABDOMEN PELVIS FINDINGS Streak artifact from arm positioning mildly reduces diagnostic sensitivity. Hepatobiliary: The abdomen is imaged in the early arterial phase, prior to hepatic venous or portal venous opacification. 2.6 cm arterial phase enhancing lesion in the lateral segment left hepatic lobe on image 57 of series 3 is technically nonspecific on today's examination, but was present and thought to represent  a hemangioma on 08/11/2016. Similarly a 2.0 cm lesion anteriorly in the left hepatic lobe on image 61 of series 3 was likewise present previously and probably a hemangioma. Gallbladder unremarkable. Pancreas: Unremarkable Spleen: Unremarkable Adrenals/Urinary Tract: Unremarkable Stomach/Bowel: Irregular gastric wall thickening proximally noted with a contained 5.0 by 4.1 by 3.8 cm  collection anterior to the proximal stomach on image 56 series 3. Locally contained perforation a gastric mass is the top differential diagnostic consideration. There is also some gastric wall thickening near the junction of the stomach and antrum. This may correspond to the antral ulceration seen on endoscopy. There is a paucity of adipose tissue in the abdomen. Orally administered contrast medium extends through to the rectum. Vascular/Lymphatic: No significant atherosclerosis. No pathologic adenopathy identified. Reproductive: Mild prostatomegaly. Other: No supplemental non-categorized findings. 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 Irregular gastric wall thickening proximally with a contained 5.0 by 4.1 by 3.8 cm collection anterior to the proximal stomach. Locally contained perforation related to a gastric mass is the top differential diagnostic consideration. There is also some gastric wall thickening near the junction of the stomach and antrum. This may correspond to the antral ulceration seen on endoscopy. 2. No findings of metastatic disease identified. Left hepatic lobe enhancing lesions are attributable to the hemangiomas shown on the 08/11/2016 MRI. 3. Airway thickening is present, suggesting bronchitis or reactive airways disease. Substantial airway plugging and occlusion in both lower lobes likely from mucus. 4. Mild prostatomegaly. 5. 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 Electronically Signed   By: Ryan Salvage M.D.   On: 11/24/2023 21:24    Anti-infectives: Anti-infectives (From admission, onward)    Start     Dose/Rate Route Frequency Ordered Stop   11/22/23 2200  piperacillin -tazobactam (ZOSYN ) IVPB 3.375 g        3.375 g 12.5 mL/hr over 240 Minutes Intravenous Every 8 hours 11/22/23 1942     11/22/23 2200  fluconazole  (DIFLUCAN ) IVPB 400 mg        400 mg 100 mL/hr over 120 Minutes Intravenous Every 24 hours 11/22/23 2202     11/22/23 1745  fluconazole   (DIFLUCAN ) tablet 150 mg        150 mg Oral  Once 11/22/23 1735 11/22/23 1758   11/22/23 1530  piperacillin -tazobactam (ZOSYN ) IVPB 3.375 g        3.375 g 100 mL/hr over 30 Minutes Intravenous  Once 11/22/23 1520 11/22/23 1635   11/22/23 0000  azithromycin  (ZITHROMAX ) 250 MG tablet        250 mg Oral Daily 11/22/23 1512     11/22/23 0000  amoxicillin -clavulanate (AUGMENTIN ) 875-125 MG tablet        1 tablet Oral Every 12 hours 11/22/23 1512          Assessment/Plan Perigastric fluid collection, ? Continued gastric perforation - WBC normal, afebrile, hemodynamically stable, no peritonitis  - repeat CT scan from 1/5 with PO contrast >> no discrete oral contrast but increased density compared to prior scan, cannot exclude oral contrast in fluid collection. - s/p EGD 1/7 by Dr. 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 - s/p CT 1/7 showing contained perforation anterior to proximal stomach >> IR consulted for aspiration for culture/cytology - NPO for possible aspiration - pt has been refusing care this morning, hoping he will agree to aspiration to help guide his care. - await path  - cont IV abx therapy/PPI therapy - [facility] will follow  closely with you   FEN - NPO/IVFs VTE - scds ID - [email or web address]   Anemia - unclear etiology, but likely secondary to process in stomach, cont to monitor Chronic nausea  GERD Tobacco abuse - needs to stop smoking at this can lead to ulcer disease as well  I reviewed nursing notes, hospitalist notes, last 24 h vitals and pain scores, last 48 h intake and output, last 24 h labs and trends, and last 24 h imaging results.   LOS: 3 days    Almarie GORMAN Pringle , [facility] 11/25/2023, 10:00 AM Please see Amion for pager number during day hours 7:00am-4:30pm or 7:00am -11:30am on weekends

## 2023-11-25 NOTE — Progress Notes (Signed)
 Chief Complaint: Patient was seen in consultation today for gastric mass/perigastric collection   Referring Physician(s): Dr. Polly  Supervising Physician: Alona Corners  Patient Status: [facility] - In-pt  History of Present Illness: Henry Kramer is a 53 y.o. male admitted with abd pain and leukocytosis. Imaging found gastric wall thickening/mass with associated perigastric collection concerning for contained perforation with abscess. His WBC normalized. He was taken for EGD yesterday which found gastric mass with drainage into gastric lumen. Biopsies were taken. This raised the concern for malignant process. CT CAP was performed last night, this did not reveal any additional lesions suspicious for malignancy/mets. The gastric mass is still present, essentially unchanged and it is still uncertain if this is neoplastic process or infectious process. Dr. Polly discussed with Dr. Erby and feel aspiration/drainage of the collection would offer additional information.(Culture, cytology, etc...)  The pt expresses frustration with not knowing what is going on. Also frustrated that he can't eat. Currently denies abd pain.  Past Medical History:  Diagnosis Date   AKI (acute kidney injury) (HCC) 05/22/2014   Chronic nausea    GERD (gastroesophageal reflux disease)    Homelessness    Marijuana use    Substance abuse (HCC)    tobacco, marijuana, EtOH    Past Surgical History:  Procedure Laterality Date   NO PAST SURGERIES      Allergies: Patient has no known allergies.  Medications:  Current Facility-Administered Medications:    acetaminophen  (TYLENOL ) tablet 650 mg, 650 mg, Oral, Q6H PRN **OR** acetaminophen  (TYLENOL ) suppository 650 mg, 650 mg, Rectal, Q6H PRN, Opyd, Evalene RAMAN, MD   feeding supplement (BOOST / RESOURCE BREEZE) liquid 1 Container, 1 Container, Oral, TID BM, Vernon Ranks, MD, 1 Container at 11/24/23 2115   fluconazole  (DIFLUCAN ) IVPB 400 mg, 400 mg,  Intravenous, Q24H, Opyd, Evalene RAMAN, MD, Last Rate: 100 mL/hr at 11/24/23 2124, 400 mg at 11/24/23 2124   HYDROmorphone  (DILAUDID ) injection 0.5-1 mg, 0.5-1 mg, Intravenous, Q3H PRN, Opyd, Evalene RAMAN, MD   ondansetron  (ZOFRAN ) tablet 4 mg, 4 mg, Oral, Q6H PRN **OR** ondansetron  (ZOFRAN ) injection 4 mg, 4 mg, Intravenous, Q6H PRN, Opyd, Timothy S, MD   [COMPLETED] pantoprazole  (PROTONIX ) injection 40 mg, 40 mg, Intravenous, Q5 min, 40 mg at 11/22/23 1601 **FOLLOWED BY** pantoprazole  (PROTONIX ) injection 40 mg, 40 mg, Intravenous, Q12H, Opyd, Timothy S, MD, 40 mg at 11/25/23 9089   piperacillin -tazobactam (ZOSYN ) IVPB 3.375 g, 3.375 g, Intravenous, Q8H, Opyd, Timothy S, MD, Last Rate: 12.5 mL/hr at 11/25/23 0555, 3.375 g at 11/25/23 0555   sodium chloride  flush (NS) 0.9 % injection 3 mL, 3 mL, Intravenous, Q12H, Opyd, Timothy S, MD, 3 mL at 11/25/23 9083   sucralfate  (CARAFATE ) tablet 1 g, 1 g, Oral, TID WC & HS, Vernon Ranks, MD, 1 g at 11/24/23 2115    Family History  Problem Relation Age of Onset   Cancer Mother    Alcohol abuse Father    Cirrhosis Father    Cancer Sister    Diabetes Brother    Obesity Brother     Social History   Socioeconomic History   Marital status: Single    Spouse name: Not on file   Number of children: Not on file   Years of education: Not on file   Highest education level: Not on file  Occupational History   Not on file  Tobacco Use   Smoking status: Every Day    Current packs/day: 0.50    Average packs/day: 0.5 packs/day  for 22.0 years (11.0 ttl pk-yrs)    Types: Cigarettes   Smokeless tobacco: Never  Vaping Use   Vaping status: Never Used  Substance and Sexual Activity   Alcohol use: Yes    Comment: occasional.     Drug use: Yes    Types: Marijuana    Comment: smokes THC 2-3 times per week.   Sexual activity: Not Currently  Other Topics Concern   Not on file  Social History Narrative   Not on file   Social Drivers of Health   Financial  Resource Strain: Not on file  Food Insecurity: Food Insecurity Present (11/23/2023)   Hunger Vital Sign    Worried About Running Out of Food in the Last Year: Sometimes true    Ran Out of Food in the Last Year: Sometimes true  Transportation Needs: Unmet Transportation Needs (11/23/2023)   PRAPARE - Administrator, Civil Service (Medical): No    Lack of Transportation (Non-Medical): Yes  Physical Activity: Not on file  Stress: Not on file  Social Connections: Not on file    Review of Systems: A 12 point ROS discussed and pertinent positives are indicated in the HPI above.  All other systems are negative.  Review of Systems  Vital Signs: BP 98/61 (BP Location: Right Arm)   Pulse (!) 52   Temp 98.5 F (36.9 C) (Oral)   Resp 18   Ht 6' (1.829 m)   Wt 122 lb (55.3 kg)   SpO2 98%   BMI 16.55 kg/m   Physical Exam Constitutional:      Appearance: He is not ill-appearing.  HENT:     Mouth/Throat:     Mouth: Mucous membranes are moist.     Pharynx: Oropharynx is clear.  Cardiovascular:     Rate and Rhythm: Normal rate and regular rhythm.     Heart sounds: Normal heart sounds.  Pulmonary:     Effort: Pulmonary effort is normal. No respiratory distress.     Breath sounds: Normal breath sounds.  Abdominal:     General: There is no distension.     Palpations: Abdomen is soft. There is no mass.     Tenderness: There is no abdominal tenderness.  Neurological:     General: No focal deficit present.     Mental Status: He is alert and oriented to person, place, and time.  Psychiatric:        Mood and Affect: Mood normal.        Thought Content: Thought content normal.      Imaging: CT CHEST ABDOMEN PELVIS W CONTRAST Result Date: 11/24/2023 CLINICAL DATA:  Ulcerated gastric mass, metastatic disease evaluation. * Tracking Code: BO * EXAM: CT CHEST, ABDOMEN, AND PELVIS WITH CONTRAST TECHNIQUE: Multidetector CT imaging of the chest, abdomen and pelvis was performed  following the standard protocol during bolus administration of intravenous contrast. RADIATION DOSE REDUCTION: This exam was performed according to the departmental dose-optimization program which includes automated exposure control, adjustment of the mA and/or kV according to patient size and/or use of iterative reconstruction technique. CONTRAST:  55mL OMNIPAQUE  IOHEXOL  350 MG/ML SOLN COMPARISON:  CT examinations of the chest, abdomen, and pelvis from 11/22/2023 FINDINGS: CT CHEST FINDINGS Cardiovascular: Unremarkable Mediastinum/Nodes: Unremarkable Lungs/Pleura: Airway thickening is present, suggesting bronchitis or reactive airways disease. Substantial airway plugging and occlusion in both lower lobes likely from mucus. No focal lung mass or suspicious lung nodule identified. Musculoskeletal: Unremarkable CT ABDOMEN PELVIS FINDINGS Streak artifact from arm positioning  mildly reduces diagnostic sensitivity. Hepatobiliary: The abdomen is imaged in the early arterial phase, prior to hepatic venous or portal venous opacification. 2.6 cm arterial phase enhancing lesion in the lateral segment left hepatic lobe on image 57 of series 3 is technically nonspecific on today's examination, but was present and thought to represent a hemangioma on 08/11/2016. Similarly a 2.0 cm lesion anteriorly in the left hepatic lobe on image 61 of series 3 was likewise present previously and probably a hemangioma. Gallbladder unremarkable. Pancreas: Unremarkable Spleen: Unremarkable Adrenals/Urinary Tract: Unremarkable Stomach/Bowel: Irregular gastric wall thickening proximally noted with a contained 5.0 by 4.1 by 3.8 cm collection anterior to the proximal stomach on image 56 series 3. Locally contained perforation a gastric mass is the top differential diagnostic consideration. There is also some gastric wall thickening near the junction of the stomach and antrum. This may correspond to the antral ulceration seen on endoscopy. There is a  paucity of adipose tissue in the abdomen. Orally administered contrast medium extends through to the rectum. Vascular/Lymphatic: No significant atherosclerosis. No pathologic adenopathy identified. Reproductive: Mild prostatomegaly. Other: No supplemental non-categorized findings. 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 Irregular gastric wall thickening proximally with a contained 5.0 by 4.1 by 3.8 cm collection anterior to the proximal stomach. Locally contained perforation related to a gastric mass is the top differential diagnostic consideration. There is also some gastric wall thickening near the junction of the stomach and antrum. This may correspond to the antral ulceration seen on endoscopy. 2. No findings of metastatic disease identified. Left hepatic lobe enhancing lesions are attributable to the hemangiomas shown on the 08/11/2016 MRI. 3. Airway thickening is present, suggesting bronchitis or reactive airways disease. Substantial airway plugging and occlusion in both lower lobes likely from mucus. 4. Mild prostatomegaly. 5. 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 Electronically Signed   By: Ryan Salvage M.D.   On: 11/24/2023 21:24   CT ABDOMEN PELVIS W CONTRAST Result Date: 11/22/2023 CLINICAL DATA:  Duodenal ulcer evaluation for gastric perforation. EXAM: CT ABDOMEN AND PELVIS WITH CONTRAST TECHNIQUE: Multidetector CT imaging of the abdomen and pelvis was performed using the standard protocol following bolus administration of intravenous contrast. RADIATION DOSE REDUCTION: This exam was performed according to the departmental dose-optimization program which includes automated exposure control, adjustment of the mA and/or kV according to patient size and/or use of iterative reconstruction technique. CONTRAST:  50mL OMNIPAQUE  IOHEXOL  350 MG/ML SOLN COMPARISON:  CT 11/22/2023 at 2:19 p.m. FINDINGS: Lower chest: No acute abnormality. Hepatobiliary: Scattered hemangiomas. No acute  abnormality. Normal gallbladder and biliary tree. Pancreas: Unremarkable. Spleen: Unremarkable. Adrenals/Urinary Tract: Normal adrenal glands. No urinary calculi or hydronephrosis. Contrast from prior CT within the ureters and bladder. Stomach/Bowel: Redemonstrated gastric wall thickening about the fundus. Gas and fluid collection with air-fluid level protruding from the gastric fundus measures 5.5 x 4.7 cm, grossly similar to earlier today. Enteric contrast is present within the stomach and small bowel. No discrete oral contrast is visualized within the gas and fluid collection however the overall density has increased compared to earlier today (Hounsfield units 38 versus 8 on the previous study). Soft tissue edema and stranding within the lesser sac. No bowel obstruction.  The appendix is not confidently visualized. Vascular/Lymphatic: No significant vascular findings are present. No enlarged abdominal or pelvic lymph nodes. Reproductive: Enlarged prostate. Other: No free intraperitoneal air. Musculoskeletal: No acute fracture. IMPRESSION: Findings remain concerning for gastric ulceration with contained perforation. No discrete oral contrast is visualized within the gas and  fluid collection however the overall density has increased compared to earlier today which may be due to dilute oral contrast within the collection. Primary differential consideration is gastric diverticulitis. Electronically Signed   By: Norman Gatlin M.D.   On: 11/22/2023 21:25   CT ABDOMEN PELVIS W CONTRAST Result Date: 11/22/2023 CLINICAL DATA:  Left flank pain. EXAM: CT ABDOMEN AND PELVIS WITH CONTRAST TECHNIQUE: Multidetector CT imaging of the abdomen and pelvis was performed using the standard protocol following bolus administration of intravenous contrast. RADIATION DOSE REDUCTION: This exam was performed according to the departmental dose-optimization program which includes automated exposure control, adjustment of the mA and/or kV  according to patient size and/or use of iterative reconstruction technique. CONTRAST:  75mL OMNIPAQUE  IOHEXOL  350 MG/ML SOLN COMPARISON:  MRI 08/11/2016 FINDINGS: Lower chest: No pleural effusion or consolidative change identified. Hepatobiliary: Previously characterized liver hemangiomas are again identified within the anterior right lobe and far lateral left lobe. The left lobe of liver hemangioma measures 2.8 cm, image 1.6/3. Anterior right hepatic lobe hemangioma measures 1.1 cm, image 119/3. Gallbladder appears normal. No bile duct dilatation Pancreas: Unremarkable. No pancreatic ductal dilatation or surrounding inflammatory changes. Spleen: Normal in size without focal abnormality. Adrenals/Urinary Tract: Normal adrenal glands. No nephrolithiasis, hydronephrosis or suspicious mass. Urinary bladder appears normal. Stomach/Bowel: Assessment of bowel pathology is diminished due to lack of enteric contrast material. There is abnormal wall thickening involving the gastric fundus there is a gas and fluid collection between the proximal stomach measuring 6.1 x 1.4 by 4.1 cm. There is an air-fluid level within this fluid collection which may communicate with the lumen of the stomach, image 20/3. This area is difficult to scratch edema and soft tissue stranding is noted within the lesser sac, there is diffuse edema identified within the lesser sac. The small bowel loops appear nondilated. Scattered mildly dilated loops of small bowel are identified which measure up to 2.2 cm. Lack of enteric contrast material and paucity of visceral fat limits assessment for the appendix which is not confidently identified. Vascular/Lymphatic: Normal caliber of the abdominal aorta. No adenopathy identified. Reproductive: Prostate gland enlargement. Other: No significant free fluid identified. No signs of free perforation. Musculoskeletal: No acute or significant osseous findings. IMPRESSION: 1. There is abnormal wall thickening  involving the gastric fundus. There is a gas and fluid collection between the proximal stomach measuring 6.1 x 1.4 x 4.1 cm. There is an air-fluid level within this fluid collection which may communicate with the lumen of the stomach. Findings are concerning for gastric ulceration with contained perforation. Consider further evaluation with repeat CT of the abdomen following oral contrast and IV contrast administration. 2. Scattered mildly dilated loops of small bowel are identified which measure up to 2.2 cm. Findings are favored to represent ileus. 3. Lack of enteric contrast material and paucity of visceral fat limits assessment for the appendix which is not confidently identified. 4. Prostate gland enlargement. 5. Previously characterized liver hemangiomas are again identified. Electronically Signed   By: Waddell Calk M.D.   On: 11/22/2023 15:11   CT Angio Chest PE W and/or Wo Contrast Result Date: 11/22/2023 CLINICAL DATA:  Shortness of breath for 1 week. Rule out pulmonary embolus. EXAM: CT ANGIOGRAPHY CHEST WITH CONTRAST TECHNIQUE: Multidetector CT imaging of the chest was performed using the standard protocol during bolus administration of intravenous contrast. Multiplanar CT image reconstructions and MIPs were obtained to evaluate the vascular anatomy. RADIATION DOSE REDUCTION: This exam was performed according to the departmental dose-optimization  program which includes automated exposure control, adjustment of the mA and/or kV according to patient size and/or use of iterative reconstruction technique. CONTRAST:  75mL OMNIPAQUE  IOHEXOL  350 MG/ML SOLN COMPARISON:  None Available. FINDINGS: Cardiovascular: Satisfactory opacification of the pulmonary arteries to the segmental level. No evidence of pulmonary embolism. Normal heart size. No pericardial effusion. Mediastinum/Nodes: No enlarged mediastinal, hilar, or axillary lymph nodes. Thyroid  gland, trachea, and esophagus demonstrate no significant  findings. Lungs/Pleura: Bilateral increased thickening of the peribronchovascular interstitium. Signs of mucous plugging identified within the lower lobe airways bilaterally. No signs of pleural effusion, airspace consolidation, atelectasis or pneumothorax. No suspicious pulmonary nodule or mass identified bilaterally. Upper Abdomen: No acute abnormality. See report for abdomen and pelvis CT also performed today. Musculoskeletal: No chest wall abnormality. No acute or significant osseous findings. Review of the MIP images confirms the above findings. IMPRESSION: 1. No evidence for acute pulmonary embolus. 2. Bilateral increased thickening of the peribronchovascular interstitium. Signs of mucous plugging identified within the lower lobe airways bilaterally. Findings are nonspecific but may reflect bronchitis. Electronically Signed   By: Waddell Calk M.D.   On: 11/22/2023 14:56   DG Chest 2 View Result Date: 11/22/2023 CLINICAL DATA:  53 year old male with history of shortness of breath. EXAM: CHEST - 2 VIEW COMPARISON:  Chest x-ray 03/08/2008. FINDINGS: Lung volumes are normal. No consolidative airspace disease. No pleural effusions. No pneumothorax. No pulmonary nodule or mass noted. Pulmonary vasculature and the cardiomediastinal silhouette are within normal limits. IMPRESSION: No radiographic evidence of acute cardiopulmonary disease. Electronically Signed   By: Toribio Aye M.D.   On: 11/22/2023 10:02    Labs:  CBC: Recent Labs    11/22/23 0933 11/23/23 0452 11/24/23 0434 11/25/23 0431  WBC 18.9* 8.6 5.8 4.7  HGB 11.8* 8.1* 11.0* 10.9*  HCT 34.4* 23.8* 32.7* 31.8*  PLT 813* 849* 682* 646*    COAGS: No results for input(s): INR, APTT in the last 8760 hours.  BMP: Recent Labs    11/22/23 0933 11/23/23 0452 11/24/23 0434 11/25/23 0431  NA 140 137 142 136  K 4.5 4.5 4.1 3.6  CL 101 101 106 103  CO2 27 22 25 26   GLUCOSE 95 127* 88 113*  BUN 25* 24* 29* 19  CALCIUM  8.9 8.4*  8.5* 8.1*  CREATININE 0.96 0.97 1.17 1.04  GFRNONAA >60 >60 >60 >60    LIVER FUNCTION TESTS: Recent Labs    11/22/23 0933 11/23/23 0452 11/24/23 0434 11/25/23 0431  BILITOT 0.3 0.4 0.5 0.3  AST 54* 53* 34 20  ALT 91* 87* 73* 54*  ALKPHOS 136* 134* 116 99  PROT 7.5 6.7 6.5 6.1*  ALBUMIN 2.4* 2.2* 2.1* 2.0*     Assessment and Plan: Gastric mass/perigastric fluid collection Unknown etiology, malignant vs infectious or component of both Pathology pending from biopsies. CT reviewed with Dr. Alona, no additional target for biopsy. Fluid collection essentially unchanged, high risk location but amenable to perc aspiration/drainage. Discussed with pt need for procedure.  Risks and benefits discussed with the patient including bleeding, infection, damage to adjacent structures, stomach perforation/fistula connection, and sepsis.  All of the patient's questions were answered, patient is agreeable to proceed. Consent signed and in chart.   Electronically Signed: Franky Rusk, PA-C 11/25/2023, 9:22 AM   I spent a total of 20 minutes in face to face in clinical consultation, greater than 50% of which was counseling/coordinating care for perigastric collection

## 2023-11-25 NOTE — Progress Notes (Addendum)
 Progress Note   LOS: 3 days   Chief Complaint:CT concerning for gastric perforation   Subjective   Patient expresses frustration with being NPO. States he is refusing care to providers and nurses. Does not want to speak to anyone unless they can change his NPO status.   Objective   Vital signs in last 24 hours: Temp:  [97.4 F (36.3 C)-98.5 F (36.9 C)] 98.5 F (36.9 C) (01/08 0506) Pulse Rate:  [48-52] 52 (01/08 0506) Resp:  [18] 18 (01/08 0506) BP: (98-119)/(61-77) 98/61 (01/08 0506) SpO2:  [98 %-99 %] 98 % (01/08 0506) Weight:  [55.3 kg] 55.3 kg (01/08 0506) Last BM Date : 11/21/23 Last BM recorded by nurses in past 5 days No data recorded  General:   male in no acute distress  Declined PE Neurologic:  Alert and  oriented x4;  No focal deficits.  Psych:  Cooperative. Normal mood and affect.  Intake/Output from previous day: 01/07 0701 - 01/08 0700 In: 1098.3 [P.O.:120; I.V.:300; IV Piggyback:678.3] Out: 1250 [Urine:1250] Intake/Output this shift: No intake/output data recorded.  Studies/Results: CT CHEST ABDOMEN PELVIS W CONTRAST Result Date: 11/24/2023 CLINICAL DATA:  Ulcerated gastric mass, metastatic disease evaluation. * Tracking Code: BO * EXAM: CT CHEST, ABDOMEN, AND PELVIS WITH CONTRAST TECHNIQUE: Multidetector CT imaging of the chest, abdomen and pelvis was performed following the standard protocol during bolus administration of intravenous contrast. RADIATION DOSE REDUCTION: This exam was performed according to the departmental dose-optimization program which includes automated exposure control, adjustment of the mA and/or kV according to patient size and/or use of iterative reconstruction technique. CONTRAST:  55mL OMNIPAQUE  IOHEXOL  350 MG/ML SOLN COMPARISON:  CT examinations of the chest, abdomen, and pelvis from 11/22/2023 FINDINGS: CT CHEST FINDINGS Cardiovascular: Unremarkable Mediastinum/Nodes: Unremarkable Lungs/Pleura: Airway thickening is present,  suggesting bronchitis or reactive airways disease. Substantial airway plugging and occlusion in both lower lobes likely from mucus. No focal lung mass or suspicious lung nodule identified. Musculoskeletal: Unremarkable CT ABDOMEN PELVIS FINDINGS Streak artifact from arm positioning mildly reduces diagnostic sensitivity. Hepatobiliary: The abdomen is imaged in the early arterial phase, prior to hepatic venous or portal venous opacification. 2.6 cm arterial phase enhancing lesion in the lateral segment left hepatic lobe on image 57 of series 3 is technically nonspecific on today's examination, but was present and thought to represent a hemangioma on 08/11/2016. Similarly a 2.0 cm lesion anteriorly in the left hepatic lobe on image 61 of series 3 was likewise present previously and probably a hemangioma. Gallbladder unremarkable. Pancreas: Unremarkable Spleen: Unremarkable Adrenals/Urinary Tract: Unremarkable Stomach/Bowel: Irregular gastric wall thickening proximally noted with a contained 5.0 by 4.1 by 3.8 cm collection anterior to the proximal stomach on image 56 series 3. Locally contained perforation a gastric mass is the top differential diagnostic consideration. There is also some gastric wall thickening near the junction of the stomach and antrum. This may correspond to the antral ulceration seen on endoscopy. There is a paucity of adipose tissue in the abdomen. Orally administered contrast medium extends through to the rectum. Vascular/Lymphatic: No significant atherosclerosis. No pathologic adenopathy identified. Reproductive: Mild prostatomegaly. Other: No supplemental non-categorized findings. 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 Irregular gastric wall thickening proximally with a contained 5.0 by 4.1 by 3.8 cm collection anterior to the proximal stomach. Locally contained perforation related to a gastric mass is the top differential diagnostic consideration. There is also some  gastric wall thickening near the junction of the stomach and antrum. This may correspond to  the antral ulceration seen on endoscopy. 2. No findings of metastatic disease identified. Left hepatic lobe enhancing lesions are attributable to the hemangiomas shown on the 08/11/2016 MRI. 3. Airway thickening is present, suggesting bronchitis or reactive airways disease. Substantial airway plugging and occlusion in both lower lobes likely from mucus. 4. Mild prostatomegaly. 5. 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 Electronically Signed   By: Ryan Salvage M.D.   On: 11/24/2023 21:24    Lab Results: Recent Labs    11/23/23 0452 11/24/23 0434 11/25/23 0431  WBC 8.6 5.8 4.7  HGB 8.1* 11.0* 10.9*  HCT 23.8* 32.7* 31.8*  PLT 849* 682* 646*   BMET Recent Labs    11/23/23 0452 11/24/23 0434 11/25/23 0431  NA 137 142 136  K 4.5 4.1 3.6  CL 101 106 103  CO2 22 25 26   GLUCOSE 127* 88 113*  BUN 24* 29* 19  CREATININE 0.97 1.17 1.04  CALCIUM  8.4* 8.5* 8.1*   LFT Recent Labs    11/25/23 0431  PROT 6.1*  ALBUMIN 2.0*  AST 20  ALT 54*  ALKPHOS 99  BILITOT 0.3   PT/INR No results for input(s): LABPROT, INR in the last 72 hours.   Scheduled Meds:  feeding supplement  1 Container Oral TID BM   pantoprazole  (PROTONIX ) IV  40 mg Intravenous Q12H   sodium chloride  flush  3 mL Intravenous Q12H   sucralfate   1 g Oral TID WC & HS   Continuous Infusions:  fluconazole  (DIFLUCAN ) IV 400 mg (11/24/23 2124)   piperacillin -tazobactam (ZOSYN )  IV 3.375 g (11/25/23 0555)      Patient profile:   53 year old male admitted with severe abdominal pain and nausea with abnormal CT scan concerning for contained gastric perforation.  History of melanotic stool for 1 to 2 months in addition to unintentional 40 pound weight loss.  History of marijuana and NSAID use.  EGD - Z- line regular, 40 cm from the incisors.  - 2 cm hiatal hernia.  - No gross lesions in the entire esophagus.  - Non-  obstructing non- bleeding gastric ulcer with no stigmata of bleeding. There is no evidence of perforation. Biopsied.  - Rule out malignancy, gastric tumor in the gastric body and on the lesser curvature of the stomach. Biopsied.  - Gastritis. Biopsied.  - Normal examined duodenum.   Impression:   1.  Abnormal CT of the stomach: CT 1/7 showing contained perforation anterior to proximal stomach. IR has been consulted.  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 Gastritis. gastric ulcer.  Biopsies pending. 2.  Epigastric pain: Related to above 3.  Marijuana abuse 4.  Chronic nausea: Thought related to above +/- this new finding    Plan:   - Avoid NSAIDs - Await pathology results - Carafate  suspension 1 g p.o. 4 times daily for 2 weeks - Pantoprazole  40 Mg IV twice daily - Appreciate surgical recommendations - Continue n.p.o. status per surgical team  Bayley M McMichael  11/25/2023, 12:07 PM   Attending physician's note   I have taken a history, reviewed the chart and examined the patient. I performed a substantive portion of this encounter, including complete performance of at least one of the key components, in conjunction with the APP. I agree with the APP's note, impression and recommendations.    Patient refusing to make eye contact, hiding under the sheets , refusing to respond or have a conversation Await biopsy results Management of contained gastric perforation  per surgical team  Please call GI if needed, available for any questions  K. Veena Keyondra Lagrand , MD [phone]

## 2023-11-26 ENCOUNTER — Inpatient Hospital Stay (HOSPITAL_COMMUNITY): Payer: Medicaid Other

## 2023-11-26 LAB — COMPREHENSIVE METABOLIC PANEL
ALT: 42 U/L (ref 0–44)
AST: 14 U/L — ABNORMAL LOW (ref 15–41)
Albumin: 2.2 g/dL — ABNORMAL LOW (ref 3.5–5.0)
Alkaline Phosphatase: 89 U/L (ref 38–126)
Anion gap: 8 (ref 5–15)
BUN: 12 mg/dL (ref 6–20)
CO2: 24 mmol/L (ref 22–32)
Calcium: 8 mg/dL — ABNORMAL LOW (ref 8.9–10.3)
Chloride: 103 mmol/L (ref 98–111)
Creatinine, Ser: 0.95 mg/dL (ref 0.61–1.24)
GFR, Estimated: >60 mL/min (ref >60)
Glucose, Bld: 94 mg/dL (ref 70–99)
Potassium: 3.5 mmol/L (ref 3.5–5.1)
Sodium: 135 mmol/L (ref 135–145)
Total Bilirubin: 0.3 mg/dL (ref 0.0–1.2)
Total Protein: 5.9 g/dL — ABNORMAL LOW (ref 6.5–8.1)

## 2023-11-26 LAB — CEA: CEA: 1.6 ng/mL (ref 0.0–4.7)

## 2023-11-26 LAB — CBC
HCT: 32.7 % — ABNORMAL LOW (ref 39.0–52.0)
Hemoglobin: 11.2 g/dL — ABNORMAL LOW (ref 13.0–17.0)
MCH: 26.4 pg (ref 26.0–34.0)
MCHC: 34.3 g/dL (ref 30.0–36.0)
MCV: 76.9 fL — ABNORMAL LOW (ref 80.0–100.0)
Platelets: 663 10*3/uL — ABNORMAL HIGH (ref 150–400)
RBC: 4.25 MIL/uL (ref 4.22–5.81)
RDW: 15.3 % (ref 11.5–15.5)
WBC: 7.5 10*3/uL (ref 4.0–10.5)
nRBC: 0 % (ref 0.0–0.2)

## 2023-11-26 LAB — CYTOLOGY - NON PAP

## 2023-11-26 LAB — SURGICAL PATHOLOGY

## 2023-11-26 NOTE — Progress Notes (Signed)
 PROGRESS NOTE    Henry Kramer  FMW:[id number] DOB: 1971/10/06 DOA: 11/22/2023 PCP: Scarlett Ronal Caldron, NP   Brief Narrative:  Patient is a 53 year old male past medical history significant for GERD, chronic nausea, substance abuse and homelessness.  Patient has also been on NSAIDs.  Patient was admitted with abdominal discomfort/pain and nausea.  CT of the abdomen was concerning for gastric wall thickening with gas and fluid collection which could reflect contained perforated of gastric ulcer and some concern of malignancy since he lost 40 pounds weight recently as well. Patient is on IV Zosyn  and Diflucan .  Status post EGD and aspiration of the fluid.  Details below.  Assessment & Plan:   Principal Problem:   Gastric ulcer with perforation (HCC) Active Problems:   Abdominal pain, chronic, epigastric   Loss of weight   Abnormal CT scan, gastrointestinal tract   Abnormal CT of the abdomen  Contained gastric perforation/gastric mass possible gastric malignancy/gastritis: -History of NSAIDs use.  CT findings as documented above.  Seen by general surgery, they engaged GI, patient underwent EGD 11/24/2023, was found to have large nonbleeding ulcer but no perforation.  Also had large mass which was biopsied.  Continue Protonix , Zosyn  and Diflucan .  Await results.  Underwent CT with contrast 11/24/2023 showing contained perforation anterior to the proximal stomach, general surgery consulted IR and patient underwent aspiration of 40 cc of fluid, developed small pneumothorax postprocedure which appears to have resolved based on the x-ray this morning.  Cultures growing gram-positive cocci, rods and gram-negative rods.  Patient on full liquid diet but upset about that.  Will defer to general surgery to advance the diet.  Anemia of chronic disease: Hemoglobin stable around 11.  Thrombocytosis: Likely reactive.  Monitor.   GERD: -history of NSAID use. -Continue IV Protonix .   History of substance  abuse and homelessness: UDS positive for THC.  Moderate protein calorie malnutrition: Will consult dietitian.  DVT prophylaxis: SCDs Start: 11/22/23 1917   Code Status: Full Code  Family Communication: None present at bedside.  Plan of care discussed with patient in length and he/she verbalized understanding and agreed with it.  Status is: Inpatient Remains inpatient appropriate because: Aspiration growing bacteria and pathology report pending as well.  Estimated body mass index is 15.85 kg/m as calculated from the following:   Height as of this encounter: 6' (1.829 m).   Weight as of this encounter: 53 kg.    Nutritional Assessment: Body mass index is 15.85 kg/m.SABRA Seen by dietician.  I agree with the assessment and plan as outlined below: Nutrition Status: Nutrition Problem: Severe Malnutrition Etiology: nausea, vomiting, acute illness, chronic illness Signs/Symptoms: per patient/family report, percent weight loss Percent weight loss: 9 % Interventions: Boost Breeze  . Skin Assessment: I have examined the patient's skin and I agree with the wound assessment as performed by the wound care RN as outlined below:    Consultants:  GI and general surgery  Procedures:  As above  Antimicrobials:  Anti-infectives (From admission, onward)    Start     Dose/Rate Route Frequency Ordered Stop   11/22/23 2200  piperacillin -tazobactam (ZOSYN ) IVPB 3.375 g        3.375 g 12.5 mL/hr over 240 Minutes Intravenous Every 8 hours 11/22/23 1942     11/22/23 2200  fluconazole  (DIFLUCAN ) IVPB 400 mg        400 mg 100 mL/hr over 120 Minutes Intravenous Every 24 hours 11/22/23 2202     11/22/23 1745  fluconazole  (  DIFLUCAN ) tablet 150 mg        150 mg Oral  Once 11/22/23 1735 11/22/23 1758   11/22/23 1530  piperacillin -tazobactam (ZOSYN ) IVPB 3.375 g        3.375 g 100 mL/hr over 30 Minutes Intravenous  Once 11/22/23 1520 11/22/23 1635   11/22/23 0000  azithromycin  (ZITHROMAX ) 250 MG  tablet        250 mg Oral Daily 11/22/23 1512     11/22/23 0000  amoxicillin -clavulanate (AUGMENTIN ) 875-125 MG tablet        1 tablet Oral Every 12 hours 11/22/23 1512           Subjective: Patient seen and examined.  He is definitely in much better mood than yesterday.  Complains of pain at the aspiration site posterior left chest.  Otherwise no other complaint.  Objective: Vitals:   11/25/23 2057 11/26/23 0402 11/26/23 0559 11/26/23 0635  BP: (!) 126/94 111/78    Pulse:   (!) 55   Resp: 16 16    Temp: 98.7 F (37.1 C) 98.1 F (36.7 C)    TempSrc: Oral Oral    SpO2:   98%   Weight:    53 kg  Height:        Intake/Output Summary (Last 24 hours) at 11/26/2023 0853 Last data filed at 11/25/2023 2100 Gross per 24 hour  Intake 240 ml  Output 400 ml  Net -160 ml   Filed Weights   11/24/23 0955 11/25/23 0506 11/26/23 0635  Weight: 53.5 kg 55.3 kg 53 kg    Examination:  General exam: Appears calm and comfortable  Respiratory system: Clear to auscultation. Respiratory effort normal. Cardiovascular system: S1 & S2 heard, RRR. No JVD, murmurs, rubs, gallops or clicks. No pedal edema. Gastrointestinal system: Abdomen is nondistended, soft and nontender. No organomegaly or masses felt. Normal bowel sounds heard. Central nervous system: Alert and oriented. No focal neurological deficits. Extremities: Symmetric 5 x 5 power. Skin: No rashes, lesions or ulcers.  Psychiatry: Judgement and insight appear normal. Mood & affect appropriate.   Data Reviewed: I have personally reviewed following labs and imaging studies  CBC: Recent Labs  Lab 11/22/23 0933 11/23/23 0452 11/24/23 0434 11/25/23 0431 11/26/23 0350  WBC 18.9* 8.6 5.8 4.7 7.5  NEUTROABS  --   --  3.0  --   --   HGB 11.8* 8.1* 11.0* 10.9* 11.2*  HCT 34.4* 23.8* 32.7* 31.8* 32.7*  MCV 78.2* 78.3* 77.3* 77.2* 76.9*  PLT 813* 849* 682* 646* 663*   Basic Metabolic Panel: Recent Labs  Lab 11/22/23 0933  11/23/23 0452 11/24/23 0434 11/25/23 0431 11/26/23 0350  NA 140 137 142 136 135  K 4.5 4.5 4.1 3.6 3.5  CL 101 101 106 103 103  CO2 27 22 25 26 24   GLUCOSE 95 127* 88 113* 94  BUN 25* 24* 29* 19 12  CREATININE 0.96 0.97 1.17 1.04 0.95  CALCIUM  8.9 8.4* 8.5* 8.1* 8.0*  MG  --   --  2.5*  --   --    GFR: Estimated Creatinine Clearance: 67.4 mL/min (by C-G formula based on SCr of 0.95 mg/dL). Liver Function Tests: Recent Labs  Lab 11/22/23 0933 11/23/23 0452 11/24/23 0434 11/25/23 0431 11/26/23 0350  AST 54* 53* 34 20 14*  ALT 91* 87* 73* 54* 42  ALKPHOS 136* 134* 116 99 89  BILITOT 0.3 0.4 0.5 0.3 0.3  PROT 7.5 6.7 6.5 6.1* 5.9*  ALBUMIN 2.4* 2.2* 2.1* 2.0* 2.2*  Recent Labs  Lab 11/22/23 0933  LIPASE 47   No results for input(s): AMMONIA in the last 168 hours. Coagulation Profile: No results for input(s): INR, PROTIME in the last 168 hours. Cardiac Enzymes: No results for input(s): CKTOTAL, CKMB, CKMBINDEX, TROPONINI in the last 168 hours. BNP (last 3 results) No results for input(s): PROBNP in the last 8760 hours. HbA1C: No results for input(s): HGBA1C in the last 72 hours. CBG: Recent Labs  Lab 11/23/23 1637  GLUCAP 94   Lipid Profile: No results for input(s): CHOL, HDL, LDLCALC, TRIG, CHOLHDL, LDLDIRECT in the last 72 hours. Thyroid  Function Tests: No results for input(s): TSH, T4TOTAL, FREET4, T3FREE, THYROIDAB in the last 72 hours. Anemia Panel: No results for input(s): VITAMINB12, FOLATE, FERRITIN, TIBC, IRON, RETICCTPCT in the last 72 hours. Sepsis Labs: No results for input(s): PROCALCITON, LATICACIDVEN in the last 168 hours.  Recent Results (from the past 240 hours)  Aerobic/Anaerobic Culture w Gram Stain (surgical/deep wound)     Status: None (Preliminary result)   Collection Time: 11/25/23  2:40 PM   Specimen: Abscess  Result Value Ref Range Status   Specimen Description ABSCESS  Final    Special Requests GASTRIC WALL  Final   Gram Stain   Final    NO WBC SEEN RARE GRAM POSITIVE COCCI IN CHAINS RARE GRAM POSITIVE RODS RARE GRAM NEGATIVE RODS    Culture   Final    CULTURE REINCUBATED FOR BETTER GROWTH Performed at [facility], [address] [place], [place] [address]    Report Status PENDING  Incomplete     Radiology Studies: DG CHEST PORT 1 VIEW Result Date: 11/26/2023 CLINICAL DATA:  Small left basilar pneumothorax after CT-guided aspiration of left upper quadrant abscess yesterday. EXAM: PORTABLE CHEST 1 VIEW COMPARISON:  Images during CT-guided procedure on 11/25/2023. FINDINGS: The heart size and mediastinal contours are within normal limits. No residual left pneumothorax is apparent on radiographic follow-up. There is no evidence of pulmonary edema, consolidation or pleural fluid. The visualized skeletal structures are unremarkable. IMPRESSION: No residual left pneumothorax apparent on radiographic follow-up. Electronically Signed   By: Marcey Moan M.D.   On: 11/26/2023 08:41   CT GUIDED NEEDLE PLACEMENT Result Date: 11/25/2023 INDICATION: 53 year old male referred for aspiration of left upper quadrant fluid collection EXAM: CT-GUIDED ASPIRATION OF LEFT UPPER QUADRANT FLUID COLLECTION TECHNIQUE: Multidetector CT imaging of the abdomen was performed following the standard protocol without IV contrast. RADIATION DOSE REDUCTION: This exam was performed according to the departmental dose-optimization program which includes automated exposure control, adjustment of the mA and/or kV according to patient size and/or use of iterative reconstruction technique. MEDICATIONS: None ANESTHESIA/SEDATION: Moderate (conscious) sedation was employed during this procedure. A total of Versed  1.5 mg and Fentanyl  50 mcg was administered intravenously by the radiology nurse. Total intra-service moderate Sedation Time: 11 minutes. The patient's level of consciousness and vital signs  were monitored continuously by radiology nursing throughout the procedure under my direct supervision. COMPLICATIONS: SIR LEVEL B - Normal therapy, includes overnight admission for observation. PROCEDURE: Informed written consent was obtained from the patient after a thorough discussion of the procedural risks, benefits and alternatives. All questions were addressed. Maximal Sterile Barrier Technique was utilized including caps, mask, sterile gowns, sterile gloves, sterile drape, hand hygiene and skin antiseptic. A timeout was performed prior to the initiation of the procedure. Patient positioned left anterior oblique on the CT gantry table. Scout CT acquired for planning purposes. The patient is prepped and draped  in the usual sterile fashion. 1% lidocaine  was used for local anesthesia. Using CT guidance, Yueh needle was advanced into the fluid collection of the left upper quadrant. Once we confirmed needle tip position modified Seldinger technique was used to place a 10 French drain. Aspiration of approximately 40-50 cc of fluid was performed with a sample sent for cytology and culture. Drain was removed. Patient tolerated the procedure well and remained hemodynamically stable throughout. No complications were encountered and no significant blood loss. FINDINGS: Fluid was entirely aspirated with collapse of the cavity. Small pneumothorax at the lung base on completion. IMPRESSION: Status post CT-guided aspiration of left upper quadrant fluid with removal of the catheter. Sample sent for cytology and culture. Signed, Ami RAMAN. Alona ROSALEA GRAVER, RPVI [facility] Radiology PLAN: Chest x-ray in the morning. Electronically Signed   By: Ami Alona D.O.   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 * Tracking Code: BO * EXAM: CT CHEST, ABDOMEN, AND PELVIS WITH  CONTRAST TECHNIQUE: Multidetector CT imaging of the chest, abdomen and pelvis was performed following the standard protocol during bolus administration of intravenous contrast. RADIATION DOSE REDUCTION: This exam was performed according to the departmental dose-optimization program which includes automated exposure control, adjustment of the mA and/or kV according to patient size and/or use of iterative reconstruction technique. CONTRAST:  55mL OMNIPAQUE  IOHEXOL  350 MG/ML SOLN COMPARISON:  CT examinations of the chest, abdomen, and pelvis from 11/22/2023 FINDINGS: CT CHEST FINDINGS Cardiovascular: Unremarkable Mediastinum/Nodes: Unremarkable Lungs/Pleura: Airway thickening is present, suggesting bronchitis or reactive airways disease. Substantial airway plugging and occlusion in both lower lobes likely from mucus. No focal lung mass or suspicious lung nodule identified. Musculoskeletal: Unremarkable CT ABDOMEN PELVIS FINDINGS Streak artifact from arm positioning mildly reduces diagnostic sensitivity. Hepatobiliary: The abdomen is imaged in the early arterial phase, prior to hepatic venous or portal venous opacification. 2.6 cm arterial phase enhancing lesion in the lateral segment left hepatic lobe on image 57 of series 3 is technically nonspecific on today's examination, but was present and thought to represent a hemangioma on 08/11/2016. Similarly a 2.0 cm lesion anteriorly in the left hepatic lobe on image 61 of series 3 was likewise present previously and probably a hemangioma. Gallbladder unremarkable. Pancreas: Unremarkable Spleen: Unremarkable Adrenals/Urinary Tract: Unremarkable Stomach/Bowel: Irregular gastric wall thickening proximally noted with a contained 5.0 by 4.1 by 3.8 cm collection anterior to the proximal stomach on image 56 series 3. Locally contained perforation a gastric mass is the top differential diagnostic consideration. There is also some gastric wall thickening near the junction of the  stomach and antrum. This may correspond to the antral ulceration seen on endoscopy. There is a paucity of adipose tissue in the abdomen. Orally administered contrast medium extends through to the rectum. Vascular/Lymphatic: No significant atherosclerosis. No pathologic adenopathy identified. Reproductive: Mild prostatomegaly. Other: No supplemental non-categorized findings. 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 Irregular gastric wall thickening proximally with a contained 5.0 by 4.1 by 3.8 cm collection anterior to the proximal stomach. Locally contained perforation related to a gastric mass is the top differential diagnostic consideration. There is also some gastric wall thickening near the junction of the stomach and antrum. This may correspond to the antral ulceration seen on endoscopy. 2. No findings of metastatic disease identified. Left hepatic lobe enhancing lesions are attributable to the hemangiomas shown on the 08/11/2016 MRI. 3.  Airway thickening is present, suggesting bronchitis or reactive airways disease. Substantial airway plugging and occlusion in both lower lobes likely from mucus. 4. Mild prostatomegaly. 5. 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 Electronically Signed   By: Ryan Salvage M.D.   On: 11/24/2023 21:24    Scheduled Meds:  feeding supplement  1 Container Oral TID BM   pantoprazole  (PROTONIX ) IV  40 mg Intravenous Q12H   sodium chloride  flush  3 mL Intravenous Q12H   sucralfate   1 g Oral TID WC & HS   Continuous Infusions:  fluconazole  (DIFLUCAN ) IV 400 mg (11/25/23 2138)   piperacillin -tazobactam (ZOSYN )  IV 3.375 g (11/26/23 0543)     LOS: 4 days   Fredia Skeeter, MD [facility]  11/26/2023, 8:53 AM   *Please note that this is a verbal dictation therefore any spelling or grammatical errors are due to the Dragon Medical One system interpretation.  Please page via Amion and do not message via secure chat for urgent patient care  matters. Secure chat can be used for non urgent patient care matters.  How to contact the [facility] Attending or Consulting provider 7A - 7P or covering provider during after hours 7P -7A, for this patient?  Check the care team in [facility] and look for a) attending/consulting [facility] provider listed and b) the [facility] team listed. Page or secure chat 7A-7P. Log into [email or web address] and use [facility] universal password to access. If you do not have the password, please contact the hospital operator. Locate the [facility] provider you are looking for under [facility] and page to a number that you can be directly reached. If you still have difficulty reaching the provider, please page the [facility] (Director on Call) for the Hospitalists listed on amion for assistance.

## 2023-11-26 NOTE — Progress Notes (Addendum)
 Gastric biopsies positive for Helicobacter pylori, lymphoid aggregates, no malignancy or dysplasia.  H.pylori treatment with Pylera X 14 day course Will defer further management to surgery  K. Scherry Ran , MD [phone]

## 2023-11-26 NOTE — Progress Notes (Signed)
 Subjective: Sitting up eating breakfast. Reports some upper abdominal pain, worse with inspiration after aspiration procedure. States he is tolerating clears without increased abdominal pain, nausea, or vomiting. No reported chest pain or urinary sxs.  ROS: See above, otherwise other systems negative  Objective: Vital signs in last 24 hours: Temp:  [98 F (36.7 C)-98.7 F (37.1 C)] 98.1 F (36.7 C) (01/09 0402) Pulse Rate:  [48-61] 55 (01/09 0559) Resp:  [12-18] 16 (01/09 0402) BP: (108-126)/(72-94) 111/78 (01/09 0402) SpO2:  [95 %-100 %] 98 % (01/09 0559) Weight:  [53 kg] 53 kg (01/09 0635) Last BM Date : 11/21/23  Intake/Output from previous day: 01/08 0701 - 01/09 0700 In: 240 [P.O.:240] Out: 400 [Urine:400] Intake/Output this shift: No intake/output data recorded.  PE: Gen: NAD, pleasant, sitting up, chronically ill appearing with diffuse muscle wasting Heart: regular Lungs: CTAB Abd: soft, nondistended, mild TTP in the epigastric and LUQ without guarding or rebound.  Lab Results:  Recent Labs    11/25/23 0431 11/26/23 0350  WBC 4.7 7.5  HGB 10.9* 11.2*  HCT 31.8* 32.7*  PLT 646* 663*   BMET Recent Labs    11/25/23 0431 11/26/23 0350  NA 136 135  K 3.6 3.5  CL 103 103  CO2 26 24  GLUCOSE 113* 94  BUN 19 12  CREATININE 1.04 0.95  CALCIUM  8.1* 8.0*   PT/INR No results for input(s): LABPROT, INR in the last 72 hours. CMP     Component Value Date/Time   NA 135 11/26/2023 0350   K 3.5 11/26/2023 0350   CL 103 11/26/2023 0350   CO2 24 11/26/2023 0350   GLUCOSE 94 11/26/2023 0350   BUN 12 11/26/2023 0350   CREATININE 0.95 11/26/2023 0350   CREATININE 1.11 04/21/2014 1032   CALCIUM  8.0 (L) 11/26/2023 0350   PROT 5.9 (L) 11/26/2023 0350   ALBUMIN 2.2 (L) 11/26/2023 0350   AST 14 (L) 11/26/2023 0350   ALT 42 11/26/2023 0350   ALKPHOS 89 11/26/2023 0350   BILITOT 0.3 11/26/2023 0350   GFRNONAA >60 11/26/2023 0350   GFRNONAA 81  04/21/2014 1032   GFRAA >60 03/31/2018 0640   GFRAA >89 04/21/2014 1032   Lipase     Component Value Date/Time   LIPASE 47 11/22/2023 0933       Studies/Results: DG CHEST PORT 1 VIEW Result Date: 11/26/2023 CLINICAL DATA:  Small left basilar pneumothorax after CT-guided aspiration of left upper quadrant abscess yesterday. EXAM: PORTABLE CHEST 1 VIEW COMPARISON:  Images during CT-guided procedure on 11/25/2023. FINDINGS: The heart size and mediastinal contours are within normal limits. No residual left pneumothorax is apparent on radiographic follow-up. There is no evidence of pulmonary edema, consolidation or pleural fluid. The visualized skeletal structures are unremarkable. IMPRESSION: No residual left pneumothorax apparent on radiographic follow-up. Electronically Signed   By: Marcey Moan M.D.   On: 11/26/2023 08:41   CT GUIDED NEEDLE PLACEMENT Result Date: 11/25/2023 INDICATION: 53 year old male referred for aspiration of left upper quadrant fluid collection EXAM: CT-GUIDED ASPIRATION OF LEFT UPPER QUADRANT FLUID COLLECTION TECHNIQUE: Multidetector CT imaging of the abdomen was performed following the standard protocol without IV contrast. RADIATION DOSE REDUCTION: This exam was performed according to the departmental dose-optimization program which includes automated exposure control, adjustment of the mA and/or kV according to patient size and/or use of iterative reconstruction technique. MEDICATIONS: None ANESTHESIA/SEDATION: Moderate (conscious) sedation was employed during this procedure. A total of Versed  1.5 mg and Fentanyl  50 mcg  was administered intravenously by the radiology nurse. Total intra-service moderate Sedation Time: 11 minutes. The patient's level of consciousness and vital signs were monitored continuously by radiology nursing throughout the procedure under my direct supervision. COMPLICATIONS: SIR LEVEL B - Normal therapy, includes overnight admission for observation.  PROCEDURE: Informed written consent was obtained from the patient after a thorough discussion of the procedural risks, benefits and alternatives. All questions were addressed. Maximal Sterile Barrier Technique was utilized including caps, mask, sterile gowns, sterile gloves, sterile drape, hand hygiene and skin antiseptic. A timeout was performed prior to the initiation of the procedure. Patient positioned left anterior oblique on the CT gantry table. Scout CT acquired for planning purposes. The patient is prepped and draped in the usual sterile fashion. 1% lidocaine  was used for local anesthesia. Using CT guidance, Yueh needle was advanced into the fluid collection of the left upper quadrant. Once we confirmed needle tip position modified Seldinger technique was used to place a 10 French drain. Aspiration of approximately 40-50 cc of fluid was performed with a sample sent for cytology and culture. Drain was removed. Patient tolerated the procedure well and remained hemodynamically stable throughout. No complications were encountered and no significant blood loss. FINDINGS: Fluid was entirely aspirated with collapse of the cavity. Small pneumothorax at the lung base on completion. IMPRESSION: Status post CT-guided aspiration of left upper quadrant fluid with removal of the catheter. Sample sent for cytology and culture. Signed, Ami RAMAN. Alona ROSALEA GRAVER, RPVI [facility] Radiology PLAN: Chest x-ray in the morning. Electronically Signed   By: Ami Alona D.O.   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 * Tracking Code: BO * EXAM: CT CHEST, ABDOMEN, AND PELVIS WITH CONTRAST TECHNIQUE: Multidetector CT imaging of the chest, abdomen and pelvis was performed following the standard protocol during bolus administration of intravenous contrast. RADIATION DOSE  REDUCTION: This exam was performed according to the departmental dose-optimization program which includes automated exposure control, adjustment of the mA and/or kV according to patient size and/or use of iterative reconstruction technique. CONTRAST:  55mL OMNIPAQUE  IOHEXOL  350 MG/ML SOLN COMPARISON:  CT examinations of the chest, abdomen, and pelvis from 11/22/2023 FINDINGS: CT CHEST FINDINGS Cardiovascular: Unremarkable Mediastinum/Nodes: Unremarkable Lungs/Pleura: Airway thickening is present, suggesting bronchitis or reactive airways disease. Substantial airway plugging and occlusion in both lower lobes likely from mucus. No focal lung mass or suspicious lung nodule identified. Musculoskeletal: Unremarkable CT ABDOMEN PELVIS FINDINGS Streak artifact from arm positioning mildly reduces diagnostic sensitivity. Hepatobiliary: The abdomen is imaged in the early arterial phase, prior to hepatic venous or portal venous opacification. 2.6 cm arterial phase enhancing lesion in the lateral segment left hepatic lobe on image 57 of series 3 is technically nonspecific on today's examination, but was present and thought to represent a hemangioma on 08/11/2016. Similarly a 2.0 cm lesion anteriorly in the left hepatic lobe on image 61 of series 3 was likewise present previously and probably a hemangioma. Gallbladder unremarkable. Pancreas: Unremarkable Spleen: Unremarkable Adrenals/Urinary Tract: Unremarkable Stomach/Bowel: Irregular gastric wall thickening proximally noted with a contained 5.0 by 4.1 by 3.8 cm collection anterior to the proximal stomach on image 56 series 3. Locally contained perforation a gastric mass is the top differential diagnostic consideration. There is also some gastric wall thickening near the junction of the stomach and antrum. This may correspond to the antral ulceration seen on endoscopy. There  is a paucity of adipose tissue in the abdomen. Orally administered contrast medium extends through to  the rectum. Vascular/Lymphatic: No significant atherosclerosis. No pathologic adenopathy identified. Reproductive: Mild prostatomegaly. Other: No supplemental non-categorized findings. 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 Irregular gastric wall thickening proximally with a contained 5.0 by 4.1 by 3.8 cm collection anterior to the proximal stomach. Locally contained perforation related to a gastric mass is the top differential diagnostic consideration. There is also some gastric wall thickening near the junction of the stomach and antrum. This may correspond to the antral ulceration seen on endoscopy. 2. No findings of metastatic disease identified. Left hepatic lobe enhancing lesions are attributable to the hemangiomas shown on the 08/11/2016 MRI. 3. Airway thickening is present, suggesting bronchitis or reactive airways disease. Substantial airway plugging and occlusion in both lower lobes likely from mucus. 4. Mild prostatomegaly. 5. 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 Electronically Signed   By: Ryan Salvage M.D.   On: 11/24/2023 21:24    Anti-infectives: Anti-infectives (From admission, onward)    Start     Dose/Rate Route Frequency Ordered Stop   11/22/23 2200  piperacillin -tazobactam (ZOSYN ) IVPB 3.375 g        3.375 g 12.5 mL/hr over 240 Minutes Intravenous Every 8 hours 11/22/23 1942     11/22/23 2200  fluconazole  (DIFLUCAN ) IVPB 400 mg        400 mg 100 mL/hr over 120 Minutes Intravenous Every 24 hours 11/22/23 2202     11/22/23 1745  fluconazole  (DIFLUCAN ) tablet 150 mg        150 mg Oral  Once 11/22/23 1735 11/22/23 1758   11/22/23 1530  piperacillin -tazobactam (ZOSYN ) IVPB 3.375 g        3.375 g 100 mL/hr over 30 Minutes Intravenous  Once 11/22/23 1520 11/22/23 1635   11/22/23 0000  azithromycin  (ZITHROMAX ) 250 MG tablet        250 mg Oral Daily 11/22/23 1512     11/22/23 0000  amoxicillin -clavulanate (AUGMENTIN ) 875-125 MG tablet        1 tablet  Oral Every 12 hours 11/22/23 1512          Assessment/Plan Perigastric fluid collection, consistent with contained gastric perforation Gastric tumor  - WBC normal, afebrile, hemodynamically stable, no peritonitis  - repeat CT scan from 1/5 with PO contrast >> no discrete oral contrast but increased density compared to prior scan, cannot exclude oral contrast in fluid collection. - s/p EGD 1/7 by Dr. 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 Await path. - s/p CT 1/7 showing contained perforation anterior to proximal stomach, s/p IR aspiration 1/8. GS w/ GPC, GNR, GPR. Await cultures and cytology results. Continue abx - cont IV abx therapy/PPI therapy - ok to advance to full liquids. Would hold there and avoid solid diet for now. - [facility] will follow closely with you   FEN - FLD, ensure VTE - scds ID - zosyn /[email or web address]   Anemia - unclear etiology, but likely secondary to process in stomach, cont to monitor Chronic nausea  GERD Tobacco abuse - needs to stop smoking at this can lead to ulcer disease as well  I reviewed nursing notes, hospitalist notes, last 24 h vitals and pain scores, last 48 h intake and output, last 24 h labs and trends, and last 24 h imaging results.   LOS: 4 days    Almarie GORMAN Pringle , [facility] Surgery 11/26/2023, 9:06  AM Please see Amion for pager number during day hours 7:00am-4:30pm or 7:00am -11:30am on weekends

## 2023-11-26 NOTE — Plan of Care (Signed)
  Problem: Clinical Measurements: Goal: Ability to maintain clinical measurements within normal limits will improve Outcome: Progressing Goal: Respiratory complications will improve Outcome: Progressing Goal: Cardiovascular complication will be avoided Outcome: Progressing   Problem: Pain Management: Goal: General experience of comfort will improve Outcome: Progressing   Problem: Safety: Goal: Ability to remain free from injury will improve Outcome: Progressing   Problem: Skin Integrity: Goal: Risk for impaired skin integrity will decrease Outcome: Progressing

## 2023-11-27 ENCOUNTER — Encounter (HOSPITAL_COMMUNITY): Payer: Self-pay | Admitting: Gastroenterology

## 2023-11-27 ENCOUNTER — Telehealth (HOSPITAL_COMMUNITY): Payer: Self-pay | Admitting: Pharmacy Technician

## 2023-11-27 ENCOUNTER — Other Ambulatory Visit (HOSPITAL_COMMUNITY): Payer: Self-pay

## 2023-11-27 LAB — BASIC METABOLIC PANEL
Anion gap: 9 (ref 5–15)
BUN: 9 mg/dL (ref 6–20)
CO2: 25 mmol/L (ref 22–32)
Calcium: 8.2 mg/dL — ABNORMAL LOW (ref 8.9–10.3)
Chloride: 105 mmol/L (ref 98–111)
Creatinine, Ser: 1 mg/dL (ref 0.61–1.24)
GFR, Estimated: >60 mL/min (ref >60)
Glucose, Bld: 86 mg/dL (ref 70–99)
Potassium: 4.1 mmol/L (ref 3.5–5.1)
Sodium: 139 mmol/L (ref 135–145)

## 2023-11-27 MED ORDER — ENSURE ENLIVE PO LIQD
237.0000 mL | Freq: Three times a day (TID) | ORAL | Status: DC
  Administered 2023-11-27 – 2023-11-29 (×8): 237 mL via ORAL

## 2023-11-27 NOTE — Plan of Care (Signed)
  Problem: Education: Goal: Knowledge of General Education information will improve Description: Including pain rating scale, medication(s)/side effects and non-pharmacologic comfort measures Outcome: Progressing   Problem: Clinical Measurements: Goal: Ability to maintain clinical measurements within normal limits will improve Outcome: Progressing Goal: Will remain free from infection Outcome: Progressing Goal: Cardiovascular complication will be avoided Outcome: Progressing   Problem: Nutrition: Goal: Adequate nutrition will be maintained Outcome: Progressing   Problem: Elimination: Goal: Will not experience complications related to urinary retention Outcome: Progressing   Problem: Pain Management: Goal: General experience of comfort will improve Outcome: Progressing   Problem: Safety: Goal: Ability to remain free from injury will improve Outcome: Progressing

## 2023-11-27 NOTE — Plan of Care (Signed)
  Problem: Education: Goal: Knowledge of General Education information will improve Description: Including pain rating scale, medication(s)/side effects and non-pharmacologic comfort measures Outcome: Progressing   Problem: Clinical Measurements: Goal: Respiratory complications will improve Outcome: Not Applicable Goal: Cardiovascular complication will be avoided Outcome: Progressing   Problem: Activity: Goal: Risk for activity intolerance will decrease Outcome: Progressing   Problem: Nutrition: Goal: Adequate nutrition will be maintained Outcome: Progressing

## 2023-11-27 NOTE — Progress Notes (Signed)
 Nutrition Follow-up  DOCUMENTATION CODES:   Severe malnutrition in context of acute illness/injury  INTERVENTION:   -Advance diet to GI soft when able.  -Provide Ensure Enlive po TID, each supplement provides 350 kcal and 20 grams of protein.  -When taking solid food, provide MVI/Minerals-1 Tab daily, folic acid  1 mg daily, thiamine  100 mg daily due to hx of ETOH and for repletion.    NUTRITION DIAGNOSIS:   Severe Malnutrition related to nausea, vomiting, acute illness, chronic illness as evidenced by per patient/family report, percent weight loss.  -ongoing  GOAL:   Patient will meet greater than or equal to 90% of their needs  -addressing with oral supplements  MONITOR:   PO intake, Weight trends, Supplement acceptance, Diet advancement, Skin, Labs  REASON FOR ASSESSMENT:   Consult Assessment of nutrition requirement/status  ASSESSMENT:   53 y/o male presented with abdominal discomfort and nausea.  PMH: AKI, GERD, chronic nausea, substance abuse-Marijuana and ETOH, homelessness.  01/07-EGD, hiatal hernia, gastric ulcer, large submucosal, non-circumferential mass with fistula-draining.  01/08-CT guided aspiration of LUQ fluid (40 cc) associated with gastric wall.   Perigastric fluid collection consistent with contained gastric perforation, gastric tumor. Found to be H. Pylori pos.   Patient is tolerating full liquids, taking the [facility] and expresses readiness to receive solid food. He is willing to consume Ensure three times daily and to continue at home. Intakes recorded  100% x 2 most recent meals.   Medications reviewed and include PPI, carafate , diflucan  IV, zosyn  IV.  Labs reviewed  NUTRITION - FOCUSED PHYSICAL EXAM:  Flowsheet Row Most Recent Value  Orbital Region Moderate depletion  Upper Arm Region Severe depletion  Thoracic and Lumbar Region Moderate depletion  Buccal Region Moderate depletion  Temple Region Severe depletion  Clavicle Bone  Region Severe depletion  Clavicle and Acromion Bone Region Severe depletion  Scapular Bone Region Moderate depletion  Dorsal Hand Severe depletion  Patellar Region Severe depletion  Anterior Thigh Region Severe depletion  Posterior Calf Region Severe depletion  Edema (RD Assessment) None  Hair Reviewed  Eyes Reviewed  Mouth Reviewed  Skin Reviewed  Nails Reviewed  [pallor nail beds]       Diet Order:   Diet Order             Diet full liquid Fluid consistency: Thin  Diet effective now                   EDUCATION NEEDS:   Not appropriate for education at this time  Skin:  Skin Assessment: Skin Integrity Issues: Skin Integrity Issues:: Other (Comment) Other: puncture wound to abdomen  Last BM:  11/27/2023  Height:   Ht Readings from Last 1 Encounters:  11/24/23 6' (1.829 m)    Weight:   Wt Readings from Last 1 Encounters:  11/27/23 57 kg    Ideal Body Weight:  80.9 kg  BMI:  Body mass index is 17.05 kg/m.  Estimated Nutritional Needs:   Kcal:  1900-2150 kcal/day  Protein:  86-114 gm/day  Fluid:  1900-2150 mL/day    Elveria Sable, RDLD Clinical Dietitian If unable to reach, please contact RD Inpatient secure chat group between 8 am-4 pm daily

## 2023-11-27 NOTE — Telephone Encounter (Signed)
 Patient Product/process Development Scientist completed.    The patient is insured through  [employer] . Patient has [employer], may use a copay card, and/or apply for patient assistance if available.    Ran test claim for Bismuth /Metronidaz/Tetracycline  140-125-125 mg and Not on Formulary   This test claim was processed through [facility]- copay amounts may vary at other pharmacies due to [employer], or as the patient moves through the different stages of their insurance plan.     Reyes Sharps, CPHT Pharmacy Technician III Certified Patient Advocate [facility] Patient Advocate Team Direct Number: [phone]  Fax: [phone]

## 2023-11-27 NOTE — Progress Notes (Signed)
 Subjective: NAEO. Ongoing mild upper abdominal pain with deep inspiration. Denies pain with PO intake. Reports liquid nonbloody stool this AM.  ROS: See above, otherwise other systems negative  Objective: Vital signs in last 24 hours: Temp:  [97.5 F (36.4 C)-98.6 F (37 C)] 98.4 F (36.9 C) (01/10 0749) Pulse Rate:  [54-67] 54 (01/10 0749) Resp:  [18-20] 18 (01/10 0749) BP: (105-110)/(65-81) 105/67 (01/10 0749) SpO2:  [96 %-100 %] 98 % (01/10 0749) Weight:  [57 kg] 57 kg (01/10 0420) Last BM Date : 11/27/23  Intake/Output from previous day: 01/09 0701 - 01/10 0700 In: 1270 [P.O.:1220; IV Piggyback:50] Out: 850 [Urine:850] Intake/Output this shift: No intake/output data recorded.  PE: Gen: NAD, pleasant, sitting up, chronically ill appearing with diffuse muscle wasting Heart: regular Lungs: CTAB Abd: soft, nondistended, mild TTP in the epigastric and LUQ without guarding or rebound.  Lab Results:  Recent Labs    11/25/23 0431 11/26/23 0350  WBC 4.7 7.5  HGB 10.9* 11.2*  HCT 31.8* 32.7*  PLT 646* 663*   BMET Recent Labs    11/26/23 0350 11/27/23 0403  NA 135 139  K 3.5 4.1  CL 103 105  CO2 24 25  GLUCOSE 94 86  BUN 12 9  CREATININE 0.95 1.00  CALCIUM  8.0* 8.2*   PT/INR No results for input(s): LABPROT, INR in the last 72 hours. CMP     Component Value Date/Time   NA 139 11/27/2023 0403   K 4.1 11/27/2023 0403   CL 105 11/27/2023 0403   CO2 25 11/27/2023 0403   GLUCOSE 86 11/27/2023 0403   BUN 9 11/27/2023 0403   CREATININE 1.00 11/27/2023 0403   CREATININE 1.11 04/21/2014 1032   CALCIUM  8.2 (L) 11/27/2023 0403   PROT 5.9 (L) 11/26/2023 0350   ALBUMIN 2.2 (L) 11/26/2023 0350   AST 14 (L) 11/26/2023 0350   ALT 42 11/26/2023 0350   ALKPHOS 89 11/26/2023 0350   BILITOT 0.3 11/26/2023 0350   GFRNONAA >60 11/27/2023 0403   GFRNONAA 81 04/21/2014 1032   GFRAA >60 03/31/2018 0640   GFRAA >89 04/21/2014 1032   Lipase     Component  Value Date/Time   LIPASE 47 11/22/2023 0933       Studies/Results: DG CHEST PORT 1 VIEW Result Date: 11/26/2023 CLINICAL DATA:  Small left basilar pneumothorax after CT-guided aspiration of left upper quadrant abscess yesterday. EXAM: PORTABLE CHEST 1 VIEW COMPARISON:  Images during CT-guided procedure on 11/25/2023. FINDINGS: The heart size and mediastinal contours are within normal limits. No residual left pneumothorax is apparent on radiographic follow-up. There is no evidence of pulmonary edema, consolidation or pleural fluid. The visualized skeletal structures are unremarkable. IMPRESSION: No residual left pneumothorax apparent on radiographic follow-up. Electronically Signed   By: Marcey Moan M.D.   On: 11/26/2023 08:41   CT GUIDED NEEDLE PLACEMENT Result Date: 11/25/2023 INDICATION: 53 year old male referred for aspiration of left upper quadrant fluid collection EXAM: CT-GUIDED ASPIRATION OF LEFT UPPER QUADRANT FLUID COLLECTION TECHNIQUE: Multidetector CT imaging of the abdomen was performed following the standard protocol without IV contrast. RADIATION DOSE REDUCTION: This exam was performed according to the departmental dose-optimization program which includes automated exposure control, adjustment of the mA and/or kV according to patient size and/or use of iterative reconstruction technique. MEDICATIONS: None ANESTHESIA/SEDATION: Moderate (conscious) sedation was employed during this procedure. A total of Versed  1.5 mg and Fentanyl  50 mcg was administered intravenously by the radiology nurse. Total intra-service moderate Sedation Time:  11 minutes. The patient's level of consciousness and vital signs were monitored continuously by radiology nursing throughout the procedure under my direct supervision. COMPLICATIONS: SIR LEVEL B - Normal therapy, includes overnight admission for observation. PROCEDURE: Informed written consent was obtained from the patient after a thorough discussion of the  procedural risks, benefits and alternatives. All questions were addressed. Maximal Sterile Barrier Technique was utilized including caps, mask, sterile gowns, sterile gloves, sterile drape, hand hygiene and skin antiseptic. A timeout was performed prior to the initiation of the procedure. Patient positioned left anterior oblique on the CT gantry table. Scout CT acquired for planning purposes. The patient is prepped and draped in the usual sterile fashion. 1% lidocaine  was used for local anesthesia. Using CT guidance, Yueh needle was advanced into the fluid collection of the left upper quadrant. Once we confirmed needle tip position modified Seldinger technique was used to place a 10 French drain. Aspiration of approximately 40-50 cc of fluid was performed with a sample sent for cytology and culture. Drain was removed. Patient tolerated the procedure well and remained hemodynamically stable throughout. No complications were encountered and no significant blood loss. FINDINGS: Fluid was entirely aspirated with collapse of the cavity. Small pneumothorax at the lung base on completion. IMPRESSION: Status post CT-guided aspiration of left upper quadrant fluid with removal of the catheter. Sample sent for cytology and culture. Signed, Ami RAMAN. Alona ROSALEA GRAVER, RPVI [facility] Radiology PLAN: Chest x-ray in the morning. Electronically Signed   By: Ami Alona D.O.   On: 11/25/2023 17:00    Anti-infectives: Anti-infectives (From admission, onward)    Start     Dose/Rate Route Frequency Ordered Stop   11/22/23 2200  piperacillin -tazobactam (ZOSYN ) IVPB 3.375 g        3.375 g 12.5 mL/hr over 240 Minutes Intravenous Every 8 hours 11/22/23 1942     11/22/23 2200  fluconazole  (DIFLUCAN ) IVPB 400 mg        400 mg 100 mL/hr over 120 Minutes Intravenous Every 24 hours 11/22/23 2202     11/22/23 1745  fluconazole  (DIFLUCAN ) tablet 150 mg        150 mg Oral  Once  11/22/23 1735 11/22/23 1758   11/22/23 1530  piperacillin -tazobactam (ZOSYN ) IVPB 3.375 g        3.375 g 100 mL/hr over 30 Minutes Intravenous  Once 11/22/23 1520 11/22/23 1635   11/22/23 0000  azithromycin  (ZITHROMAX ) 250 MG tablet        250 mg Oral Daily 11/22/23 1512     11/22/23 0000  amoxicillin -clavulanate (AUGMENTIN ) 875-125 MG tablet        1 tablet Oral Every 12 hours 11/22/23 1512          Assessment/Plan Perigastric fluid collection, consistent with contained gastric perforation Gastric tumor  - WBC normal, afebrile, hemodynamically stable, no peritonitis  - repeat CT scan from 1/5 with PO contrast >> no discrete oral contrast but increased density compared to prior scan, cannot exclude oral contrast in fluid collection. - s/p EGD 1/7 by Dr. 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 Path: antral ulcer w/ gastritis with lymphoid aggregates, negative for dysplasia/carcinoma; path: subepithelial mass positive for h pylori, negative for dysplasia/carcinoma; stomach biopsy chronic gastritis with foveolar hyperplasia/intestinal metaplasia, no dysplasia/carcinoma. - h pylori tx x 14 days, GI planning outpatient follow up - s/p CT 1/7 showing contained perforation anterior to proximal stomach, s/p  IR aspiration 1/8. GS w/ GPC, GNR, GPR. cultures w/ rate staph epidermis. Cytology no malignant cells - cont IV abx therapy/PPI therapy - continue FLD, will discuss timing of soft diet with MD. No clinical evidence of ongoing gastric leak.  - [facility] will follow closely with you, no current indications for surgery.   FEN - FLD, ensure VTE - scds ID - [email or web address]   Anemia - unclear etiology, but likely secondary to process in stomach, cont to monitor Chronic nausea  GERD Tobacco abuse - needs to stop smoking at this can lead to ulcer disease as well  I reviewed nursing notes, hospitalist notes, last 24 h vitals  and pain scores, last 48 h intake and output, last 24 h labs and trends, and last 24 h imaging results.   LOS: 5 days    Almarie GORMAN Pringle , [facility] Surgery 11/27/2023, 11:46 AM Please see Amion for pager number during day hours 7:00am-4:30pm or 7:00am -11:30am on weekends

## 2023-11-27 NOTE — Progress Notes (Signed)
 PROGRESS NOTE    Henry Kramer  FMW:[id number] DOB: [birth year] DOA: 11/22/2023 PCP: Scarlett Ronal Caldron, NP   Brief Narrative: 53 year old with past medical history significant for GERD, chronic nausea, substance abuse, homelessness has been taking NSAIDs presents with abdominal pain, nausea.  CT abdomen and pelvis concerning for gastric wall thickening with gas and fluid collection which could reflect contained perforated gastric ulcer and concern for malignancy, since he has lost 40 pounds weight recently.  Patient was started on IV Zosyn  and Diflucan .  Status post endoscopy and aspiration of the fluid.     Assessment & Plan:   Principal Problem:   Gastric ulcer with perforation (HCC) Active Problems:   Abdominal pain, chronic, epigastric   Loss of weight   Abnormal CT scan, gastrointestinal tract   Abnormal CT of the abdomen  1-Contained  Gastric perforation/gastric mass -Concern for gastric malignancy/gastritis. -CT 1/05: Wall thickening involving the gastric fundus, gas and fluid collection between the proximal stomach measuring 6.1 x 1.4 x 4.1 cm.  Finding concerning for gastric ulceration with contained perforation. -Underwent endoscopy 11/24/2023 found to have large nonbleeding ulcer but no perforation.  Also had a large mass which was biopsied. -Continue  with IV Protonix , Zosyn  and Diflucan  -Underwent CT with contrast 7/25 showing contained perforation anterior to the proximal stomach, general surgery ER consulted.  Patient underwent aspiration of 40 cc of fluid, he developed a small pneumothorax postprocedure which appeared to have resolved and x-ray the following morning. -Culture growing gram-positive cocci, rods and gram-negative rods. -Biopsy came back positive for H. pylori, stomach subepithelia mass: active gastritis,  negative fro metaplasia or dysplasia or carcinoma. Stomach Biopsy: focally active gastritis with lymphoid aggregates, foveolar  hyperplasia and extensive  intestinal metaplasia  Awaiting surgery recommendation.  Start H pylori tx when tolerating diet.   2-Anemia of chronic disease Hb stable.   Thrombocytosis.  In setting infection,. Monitor.   GERD: PPI  History of substance abuse homelessness counseling   Moderate Protein caloric Malnutrition.  On supplement.        Nutrition Problem: Severe Malnutrition Etiology: nausea, vomiting, acute illness, chronic illness    Signs/Symptoms: per patient/family report, percent weight loss Percent weight loss: 9 %    Interventions: Boost Breeze  Estimated body mass index is 17.05 kg/m as calculated from the following:   Height as of this encounter: 6' (1.829 m).   Weight as of this encounter: 57 kg.   DVT prophylaxis: SCD Code Status: Full code Family Communication:  Disposition Plan:  Status is: Inpatient Remains inpatient appropriate because: management of contained perforation.     Consultants:  GI Sx  Procedures:  Endoscopy   Antimicrobials:    Subjective: He is alert, report mild abdomina pain. Looking forward on having his diet advanced  Objective: Vitals:   11/26/23 1910 11/26/23 2307 11/27/23 0420 11/27/23 0749  BP: 106/81 110/65 109/66 105/67  Pulse: 67 (!) 59 (!) 55 (!) 54  Resp: 20  18 18   Temp: 98.6 F (37 C) 97.9 F (36.6 C) (!) 97.5 F (36.4 C) 98.4 F (36.9 C)  TempSrc: Oral Oral Oral Oral  SpO2: 96% 99% 100% 98%  Weight:   57 kg   Height:        Intake/Output Summary (Last 24 hours) at 11/27/2023 0757 Last data filed at 11/27/2023 0424 Gross per 24 hour  Intake 1270 ml  Output 850 ml  Net 420 ml   Filed Weights   11/25/23 0506 11/26/23 9364 11/27/23 0420  Weight: 55.3 kg 53 kg 57 kg    Examination:  General exam: Appears calm and comfortable  Respiratory system: Clear to auscultation. Respiratory effort normal. Cardiovascular system: S1 & S2 heard, RRR. No JVD, murmurs, rubs, gallops or clicks. No pedal  edema. Gastrointestinal system: Abdomen is nondistended, soft mild tender Central nervous system: Alert and oriented. No focal neurological deficits. Extremities: Symmetric 5 x 5 power.   Data Reviewed: I have personally reviewed following labs and imaging studies  CBC: Recent Labs  Lab 11/22/23 0933 11/23/23 0452 11/24/23 0434 11/25/23 0431 11/26/23 0350  WBC 18.9* 8.6 5.8 4.7 7.5  NEUTROABS  --   --  3.0  --   --   HGB 11.8* 8.1* 11.0* 10.9* 11.2*  HCT 34.4* 23.8* 32.7* 31.8* 32.7*  MCV 78.2* 78.3* 77.3* 77.2* 76.9*  PLT 813* 849* 682* 646* 663*   Basic Metabolic Panel: Recent Labs  Lab 11/23/23 0452 11/24/23 0434 11/25/23 0431 11/26/23 0350 11/27/23 0403  NA 137 142 136 135 139  K 4.5 4.1 3.6 3.5 4.1  CL 101 106 103 103 105  CO2 22 25 26 24 25   GLUCOSE 127* 88 113* 94 86  BUN 24* 29* 19 12 9   CREATININE 0.97 1.17 1.04 0.95 1.00  CALCIUM  8.4* 8.5* 8.1* 8.0* 8.2*  MG  --  2.5*  --   --   --    GFR: Estimated Creatinine Clearance: 68.9 mL/min (by C-G formula based on SCr of 1 mg/dL). Liver Function Tests: Recent Labs  Lab 11/22/23 0933 11/23/23 0452 11/24/23 0434 11/25/23 0431 11/26/23 0350  AST 54* 53* 34 20 14*  ALT 91* 87* 73* 54* 42  ALKPHOS 136* 134* 116 99 89  BILITOT 0.3 0.4 0.5 0.3 0.3  PROT 7.5 6.7 6.5 6.1* 5.9*  ALBUMIN 2.4* 2.2* 2.1* 2.0* 2.2*   Recent Labs  Lab 11/22/23 0933  LIPASE 47   No results for input(s): AMMONIA in the last 168 hours. Coagulation Profile: No results for input(s): INR, PROTIME in the last 168 hours. Cardiac Enzymes: No results for input(s): CKTOTAL, CKMB, CKMBINDEX, TROPONINI in the last 168 hours. BNP (last 3 results) No results for input(s): PROBNP in the last 8760 hours. HbA1C: No results for input(s): HGBA1C in the last 72 hours. CBG: Recent Labs  Lab 11/23/23 1637  GLUCAP 94   Lipid Profile: No results for input(s): CHOL, HDL, LDLCALC, TRIG, CHOLHDL, LDLDIRECT in the  last 72 hours. Thyroid  Function Tests: No results for input(s): TSH, T4TOTAL, FREET4, T3FREE, THYROIDAB in the last 72 hours. Anemia Panel: No results for input(s): VITAMINB12, FOLATE, FERRITIN, TIBC, IRON, RETICCTPCT in the last 72 hours. Sepsis Labs: No results for input(s): PROCALCITON, LATICACIDVEN in the last 168 hours.  Recent Results (from the past 240 hours)  Aerobic/Anaerobic Culture w Gram Stain (surgical/deep wound)     Status: None (Preliminary result)   Collection Time: 11/25/23  2:40 PM   Specimen: Abscess  Result Value Ref Range Status   Specimen Description ABSCESS  Final   Special Requests GASTRIC WALL  Final   Gram Stain   Final    NO WBC SEEN RARE GRAM POSITIVE COCCI IN CHAINS RARE GRAM POSITIVE RODS RARE GRAM NEGATIVE RODS    Culture   Final    CULTURE REINCUBATED FOR BETTER GROWTH Performed at [facility], [address] [place], [place] [address]    Report Status PENDING  Incomplete         Radiology Studies: DG CHEST PORT  1 VIEW Result Date: 11/26/2023 CLINICAL DATA:  Small left basilar pneumothorax after CT-guided aspiration of left upper quadrant abscess yesterday. EXAM: PORTABLE CHEST 1 VIEW COMPARISON:  Images during CT-guided procedure on 11/25/2023. FINDINGS: The heart size and mediastinal contours are within normal limits. No residual left pneumothorax is apparent on radiographic follow-up. There is no evidence of pulmonary edema, consolidation or pleural fluid. The visualized skeletal structures are unremarkable. IMPRESSION: No residual left pneumothorax apparent on radiographic follow-up. Electronically Signed   By: Marcey Moan M.D.   On: 11/26/2023 08:41   CT GUIDED NEEDLE PLACEMENT Result Date: 11/25/2023 INDICATION: 53 year old male referred for aspiration of left upper quadrant fluid collection EXAM: CT-GUIDED ASPIRATION OF LEFT UPPER QUADRANT FLUID COLLECTION TECHNIQUE: Multidetector CT imaging of the  abdomen was performed following the standard protocol without IV contrast. RADIATION DOSE REDUCTION: This exam was performed according to the departmental dose-optimization program which includes automated exposure control, adjustment of the mA and/or kV according to patient size and/or use of iterative reconstruction technique. MEDICATIONS: None ANESTHESIA/SEDATION: Moderate (conscious) sedation was employed during this procedure. A total of Versed  1.5 mg and Fentanyl  50 mcg was administered intravenously by the radiology nurse. Total intra-service moderate Sedation Time: 11 minutes. The patient's level of consciousness and vital signs were monitored continuously by radiology nursing throughout the procedure under my direct supervision. COMPLICATIONS: SIR LEVEL B - Normal therapy, includes overnight admission for observation. PROCEDURE: Informed written consent was obtained from the patient after a thorough discussion of the procedural risks, benefits and alternatives. All questions were addressed. Maximal Sterile Barrier Technique was utilized including caps, mask, sterile gowns, sterile gloves, sterile drape, hand hygiene and skin antiseptic. A timeout was performed prior to the initiation of the procedure. Patient positioned left anterior oblique on the CT gantry table. Scout CT acquired for planning purposes. The patient is prepped and draped in the usual sterile fashion. 1% lidocaine  was used for local anesthesia. Using CT guidance, Yueh needle was advanced into the fluid collection of the left upper quadrant. Once we confirmed needle tip position modified Seldinger technique was used to place a 10 French drain. Aspiration of approximately 40-50 cc of fluid was performed with a sample sent for cytology and culture. Drain was removed. Patient tolerated the procedure well and remained hemodynamically stable throughout. No complications were encountered and no significant blood loss. FINDINGS: Fluid was entirely  aspirated with collapse of the cavity. Small pneumothorax at the lung base on completion. IMPRESSION: Status post CT-guided aspiration of left upper quadrant fluid with removal of the catheter. Sample sent for cytology and culture. Signed, Ami RAMAN. Alona ROSALEA GRAVER, RPVI [facility] Radiology PLAN: Chest x-ray in the morning. Electronically Signed   By: Ami Alona D.O.   On: 11/25/2023 17:00        Scheduled Meds:  feeding supplement  1 Container Oral TID BM   pantoprazole  (PROTONIX ) IV  40 mg Intravenous Q12H   sodium chloride  flush  3 mL Intravenous Q12H   sucralfate   1 g Oral TID WC & HS   Continuous Infusions:  fluconazole  (DIFLUCAN ) IV 400 mg (11/26/23 2230)   piperacillin -tazobactam (ZOSYN )  IV 3.375 g (11/27/23 0627)     LOS: 5 days    Time spent: 35 minutes    Carmelite Violet A Treyden Hakim, MD [facility]   If 7PM-7AM, please contact night-coverage [email or web address]  11/27/2023, 7:57 AM

## 2023-11-28 LAB — CBC
HCT: 30.1 % — ABNORMAL LOW (ref 39.0–52.0)
Hemoglobin: 10.4 g/dL — ABNORMAL LOW (ref 13.0–17.0)
MCH: 26.7 pg (ref 26.0–34.0)
MCHC: 34.6 g/dL (ref 30.0–36.0)
MCV: 77.2 fL — ABNORMAL LOW (ref 80.0–100.0)
Platelets: 560 10*3/uL — ABNORMAL HIGH (ref 150–400)
RBC: 3.9 MIL/uL — ABNORMAL LOW (ref 4.22–5.81)
RDW: 15.9 % — ABNORMAL HIGH (ref 11.5–15.5)
WBC: 6.6 10*3/uL (ref 4.0–10.5)
nRBC: 0 % (ref 0.0–0.2)

## 2023-11-28 LAB — BASIC METABOLIC PANEL
Anion gap: 6 (ref 5–15)
BUN: 12 mg/dL (ref 6–20)
CO2: 27 mmol/L (ref 22–32)
Calcium: 8 mg/dL — ABNORMAL LOW (ref 8.9–10.3)
Chloride: 104 mmol/L (ref 98–111)
Creatinine, Ser: 0.93 mg/dL (ref 0.61–1.24)
GFR, Estimated: >60 mL/min (ref >60)
Glucose, Bld: 105 mg/dL — ABNORMAL HIGH (ref 70–99)
Potassium: 3.7 mmol/L (ref 3.5–5.1)
Sodium: 137 mmol/L (ref 135–145)

## 2023-11-28 MED ORDER — VANCOMYCIN HCL 1250 MG/250ML IV SOLN
1250.0000 mg | Freq: Once | INTRAVENOUS | Status: AC
  Administered 2023-11-28: 1250 mg via INTRAVENOUS
  Filled 2023-11-28: qty 250

## 2023-11-28 MED ORDER — BISMUTH SUBSALICYLATE 262 MG PO CHEW
524.0000 mg | CHEWABLE_TABLET | Freq: Four times a day (QID) | ORAL | Status: DC
  Administered 2023-11-28 – 2023-11-30 (×8): 524 mg via ORAL
  Filled 2023-11-28 (×9): qty 2

## 2023-11-28 MED ORDER — METRONIDAZOLE 500 MG PO TABS
500.0000 mg | ORAL_TABLET | Freq: Four times a day (QID) | ORAL | Status: DC
  Administered 2023-11-28 – 2023-11-30 (×8): 500 mg via ORAL
  Filled 2023-11-28 (×8): qty 1

## 2023-11-28 MED ORDER — VANCOMYCIN HCL 750 MG/150ML IV SOLN
750.0000 mg | Freq: Two times a day (BID) | INTRAVENOUS | Status: DC
  Filled 2023-11-28: qty 150

## 2023-11-28 MED ORDER — BISMUTH/METRONIDAZ/TETRACYCLIN 140-125-125 MG PO CAPS
1.0000 | ORAL_CAPSULE | Freq: Four times a day (QID) | ORAL | Status: DC
Start: 2023-11-28 — End: 2023-11-28

## 2023-11-28 MED ORDER — VANCOMYCIN HCL 1000 MG IV SOLR
750.0000 mg | Freq: Two times a day (BID) | INTRAVENOUS | Status: DC
  Administered 2023-11-29 – 2023-11-30 (×3): 750 mg via INTRAVENOUS
  Filled 2023-11-28 (×2): qty 15
  Filled 2023-11-28 (×2): qty 7.5

## 2023-11-28 MED ORDER — TETRACYCLINE HCL 250 MG PO CAPS
500.0000 mg | ORAL_CAPSULE | Freq: Four times a day (QID) | ORAL | Status: DC
  Administered 2023-11-28 – 2023-11-30 (×8): 500 mg via ORAL
  Filled 2023-11-28 (×9): qty 2

## 2023-11-28 NOTE — Progress Notes (Signed)
 Subjective: NAEO. Denies complaints. Denies abdominal pain. Tolerating liquids without increased pain, n/v, etc.  ROS: See above, otherwise other systems negative  Objective: Vital signs in last 24 hours: Temp:  [97.8 F (36.6 C)-98.9 F (37.2 C)] 98.9 F (37.2 C) (01/11 1313) Pulse Rate:  [61-65] 64 (01/11 1313) Resp:  [16-20] 16 (01/11 1313) BP: (106-115)/(64-79) 111/64 (01/11 1313) SpO2:  [98 %-100 %] 98 % (01/11 1313) Weight:  [58.1 kg] 58.1 kg (01/11 0407) Last BM Date : 11/28/23  Intake/Output from previous day: 01/10 0701 - 01/11 0700 In: 1734 [P.O.:1360; IV Piggyback:374] Out: 1800 [Urine:1800] Intake/Output this shift: Total I/O In: 888.4 [P.O.:476; IV Piggyback:412.4] Out: 1050 [Urine:1050]  PE: Gen: NAD, pleasant, sitting up, chronically ill appearing with diffuse muscle wasting Heart: regular Lungs: CTAB Abd: soft, nondistended, nontender.  Lab Results:  Recent Labs    11/26/23 0350 11/28/23 0847  WBC 7.5 6.6  HGB 11.2* 10.4*  HCT 32.7* 30.1*  PLT 663* 560*   BMET Recent Labs    11/27/23 0403 11/28/23 0847  NA 139 137  K 4.1 3.7  CL 105 104  CO2 25 27  GLUCOSE 86 105*  BUN 9 12  CREATININE 1.00 0.93  CALCIUM  8.2* 8.0*   PT/INR No results for input(s): LABPROT, INR in the last 72 hours. CMP     Component Value Date/Time   NA 137 11/28/2023 0847   K 3.7 11/28/2023 0847   CL 104 11/28/2023 0847   CO2 27 11/28/2023 0847   GLUCOSE 105 (H) 11/28/2023 0847   BUN 12 11/28/2023 0847   CREATININE 0.93 11/28/2023 0847   CREATININE 1.11 04/21/2014 1032   CALCIUM  8.0 (L) 11/28/2023 0847   PROT 5.9 (L) 11/26/2023 0350   ALBUMIN 2.2 (L) 11/26/2023 0350   AST 14 (L) 11/26/2023 0350   ALT 42 11/26/2023 0350   ALKPHOS 89 11/26/2023 0350   BILITOT 0.3 11/26/2023 0350   GFRNONAA >60 11/28/2023 0847   GFRNONAA 81 04/21/2014 1032   GFRAA >60 03/31/2018 0640   GFRAA >89 04/21/2014 1032   Lipase     Component Value Date/Time    LIPASE 47 11/22/2023 0933       Studies/Results: No results found.   Anti-infectives: Anti-infectives (From admission, onward)    Start     Dose/Rate Route Frequency Ordered Stop   11/22/23 2200  piperacillin -tazobactam (ZOSYN ) IVPB 3.375 g        3.375 g 12.5 mL/hr over 240 Minutes Intravenous Every 8 hours 11/22/23 1942     11/22/23 2200  fluconazole  (DIFLUCAN ) IVPB 400 mg        400 mg 100 mL/hr over 120 Minutes Intravenous Every 24 hours 11/22/23 2202     11/22/23 1745  fluconazole  (DIFLUCAN ) tablet 150 mg        150 mg Oral  Once 11/22/23 1735 11/22/23 1758   11/22/23 1530  piperacillin -tazobactam (ZOSYN ) IVPB 3.375 g        3.375 g 100 mL/hr over 30 Minutes Intravenous  Once 11/22/23 1520 11/22/23 1635   11/22/23 0000  azithromycin  (ZITHROMAX ) 250 MG tablet        250 mg Oral Daily 11/22/23 1512     11/22/23 0000  amoxicillin -clavulanate (AUGMENTIN ) 875-125 MG tablet        1 tablet Oral Every 12 hours 11/22/23 1512          Assessment/Plan Perigastric fluid collection, consistent with contained gastric perforation Gastric tumor  - WBC normal, afebrile, hemodynamically  stable, no peritonitis  - repeat CT scan from 1/5 with PO contrast >> no discrete oral contrast but increased density compared to prior scan, cannot exclude oral contrast in fluid collection. - s/p EGD 1/7 by Dr. 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 Path: antral ulcer w/ gastritis with lymphoid aggregates, negative for dysplasia/carcinoma; path: subepithelial mass positive for h pylori, negative for dysplasia/carcinoma; stomach biopsy chronic gastritis with foveolar hyperplasia/intestinal metaplasia, no dysplasia/carcinoma. - h pylori tx x 14 days, GI planning outpatient follow up - s/p CT 1/7 showing contained perforation anterior to proximal stomach, s/p IR aspiration 1/8. GS w/ GPC, GNR, GPR. cultures w/ rate staph  epidermis. Cytology no malignant cells - cont IV abx therapy/PPI therapy - ok for soft diet. No clinical evidence of ongoing gastric leak.  - CCS will follow closely with you, no current indications for surgery.   FEN - soft, ensure VTE - scds ID - [email or web address]   Anemia - unclear etiology, but likely secondary to process in stomach, cont to monitor Chronic nausea  GERD Tobacco abuse - needs to stop smoking at this can lead to ulcer disease as well  I reviewed nursing notes, hospitalist notes, last 24 h vitals and pain scores, last 48 h intake and output, last 24 h labs and trends, and last 24 h imaging results.   LOS: 6 days    Lonni Pizza, MD [facility], [facility]

## 2023-11-28 NOTE — Progress Notes (Signed)
 PROGRESS NOTE    Henry Kramer  FMW:[id number] DOB: 03/07/1971 DOA: 11/22/2023 PCP: Scarlett Ronal Caldron, NP   Brief Narrative: 53 year old with past medical history significant for GERD, chronic nausea, substance abuse, homelessness has been taking NSAIDs presents with abdominal pain, nausea.  CT abdomen and pelvis concerning for gastric wall thickening with gas and fluid collection which could reflect contained perforated gastric ulcer and concern for malignancy, since he has lost 40 pounds weight recently.  Patient was started on IV Zosyn  and Diflucan .  Status post endoscopy and aspiration of the fluid.     Assessment & Plan:   Principal Problem:   Gastric ulcer with perforation (HCC) Active Problems:   Abdominal pain, chronic, epigastric   Loss of weight   Abnormal CT scan, gastrointestinal tract   Abnormal CT of the abdomen  1-Contained  Gastric perforation/gastric mass -Concern for gastric malignancy/gastritis. -CT 1/05: Wall thickening involving the gastric fundus, gas and fluid collection between the proximal stomach measuring 6.1 x 1.4 x 4.1 cm.  Finding concerning for gastric ulceration with contained perforation. -Underwent endoscopy 11/24/2023 found to have large nonbleeding ulcer but no perforation.  Also had a large mass which was biopsied. -Continue  with IV Protonix , Zosyn  and Diflucan  -Underwent CT with contrast 7/25 showing contained perforation anterior to the proximal stomach, general surgery ER consulted.  Patient underwent aspiration of 40 cc of fluid, he developed a small pneumothorax postprocedure which appeared to have resolved and x-ray the following morning. -Culture growing;  Staph epidermis, Streptococcus Viridans.  -Biopsy came back positive for H. pylori, stomach subepithelia mass: active gastritis,  negative fro metaplasia or dysplasia or carcinoma. Stomach Biopsy: focally active gastritis with lymphoid aggregates, foveolar  hyperplasia and extensive  intestinal metaplasia  Awaiting surgery recommendation.  Start H pylori tx Pylera.  Start Vancomycin , staph epi oxacillin resistant.   2-Anemia of chronic disease Hb stable.   Thrombocytosis.  In setting infection,. Monitor.   GERD: PPI  History of substance abuse homelessness counseling   Moderate Protein caloric Malnutrition.  On supplement.        Nutrition Problem: Severe Malnutrition Etiology: nausea, vomiting, acute illness, chronic illness    Signs/Symptoms: per patient/family report, percent weight loss Percent weight loss: 9 %    Interventions: Boost Breeze  Estimated body mass index is 17.37 kg/m as calculated from the following:   Height as of this encounter: 6' (1.829 m).   Weight as of this encounter: 58.1 kg.   DVT prophylaxis: SCD Code Status: Full code Family Communication:  Disposition Plan:  Status is: Inpatient Remains inpatient appropriate because: management of contained perforation.     Consultants:  GI Sx  Procedures:  Endoscopy   Antimicrobials:    Subjective: Still having abdominal, chest pain.    Objective: Vitals:   11/27/23 1432 11/27/23 1910 11/28/23 0407 11/28/23 1313  BP: 114/65 106/79 115/65 111/64  Pulse: 65 64 61 64  Resp: 18 20 18 16   Temp: 97.8 F (36.6 C) 97.8 F (36.6 C) 98 F (36.7 C) 98.9 F (37.2 C)  TempSrc: Oral Oral Oral Oral  SpO2: 98% 100% 98% 98%  Weight:   58.1 kg   Height:        Intake/Output Summary (Last 24 hours) at 11/28/2023 1340 Last data filed at 11/28/2023 1313 Gross per 24 hour  Intake 2622.43 ml  Output 2850 ml  Net -227.57 ml   Filed Weights   11/26/23 0635 11/27/23 0420 11/28/23 0407  Weight: 53 kg 57 kg  58.1 kg    Examination:  General exam: NAD Respiratory system: CTA Cardiovascular system: S 1, S 2 RRR Gastrointestinal system:  BS present, soft, mild tender Central nervous system: alert. Extremities: no edema   Data Reviewed: I have personally  reviewed following labs and imaging studies  CBC: Recent Labs  Lab 11/23/23 0452 11/24/23 0434 11/25/23 0431 11/26/23 0350 11/28/23 0847  WBC 8.6 5.8 4.7 7.5 6.6  NEUTROABS  --  3.0  --   --   --   HGB 8.1* 11.0* 10.9* 11.2* 10.4*  HCT 23.8* 32.7* 31.8* 32.7* 30.1*  MCV 78.3* 77.3* 77.2* 76.9* 77.2*  PLT 849* 682* 646* 663* 560*   Basic Metabolic Panel: Recent Labs  Lab 11/24/23 0434 11/25/23 0431 11/26/23 0350 11/27/23 0403 11/28/23 0847  NA 142 136 135 139 137  K 4.1 3.6 3.5 4.1 3.7  CL 106 103 103 105 104  CO2 25 26 24 25 27   GLUCOSE 88 113* 94 86 105*  BUN 29* 19 12 9 12   CREATININE 1.17 1.04 0.95 1.00 0.93  CALCIUM  8.5* 8.1* 8.0* 8.2* 8.0*  MG 2.5*  --   --   --   --    GFR: Estimated Creatinine Clearance: 75.5 mL/min (by C-G formula based on SCr of 0.93 mg/dL). Liver Function Tests: Recent Labs  Lab 11/22/23 0933 11/23/23 0452 11/24/23 0434 11/25/23 0431 11/26/23 0350  AST 54* 53* 34 20 14*  ALT 91* 87* 73* 54* 42  ALKPHOS 136* 134* 116 99 89  BILITOT 0.3 0.4 0.5 0.3 0.3  PROT 7.5 6.7 6.5 6.1* 5.9*  ALBUMIN 2.4* 2.2* 2.1* 2.0* 2.2*   Recent Labs  Lab 11/22/23 0933  LIPASE 47   No results for input(s): AMMONIA in the last 168 hours. Coagulation Profile: No results for input(s): INR, PROTIME in the last 168 hours. Cardiac Enzymes: No results for input(s): CKTOTAL, CKMB, CKMBINDEX, TROPONINI in the last 168 hours. BNP (last 3 results) No results for input(s): PROBNP in the last 8760 hours. HbA1C: No results for input(s): HGBA1C in the last 72 hours. CBG: Recent Labs  Lab 11/23/23 1637  GLUCAP 94   Lipid Profile: No results for input(s): CHOL, HDL, LDLCALC, TRIG, CHOLHDL, LDLDIRECT in the last 72 hours. Thyroid  Function Tests: No results for input(s): TSH, T4TOTAL, FREET4, T3FREE, THYROIDAB in the last 72 hours. Anemia Panel: No results for input(s): VITAMINB12, FOLATE, FERRITIN, TIBC,  IRON, RETICCTPCT in the last 72 hours. Sepsis Labs: No results for input(s): PROCALCITON, LATICACIDVEN in the last 168 hours.  Recent Results (from the past 240 hours)  Aerobic/Anaerobic Culture w Gram Stain (surgical/deep wound)     Status: None (Preliminary result)   Collection Time: 11/25/23  2:40 PM   Specimen: Abscess  Result Value Ref Range Status   Specimen Description ABSCESS  Final   Special Requests GASTRIC WALL  Final   Gram Stain   Final    NO WBC SEEN RARE GRAM POSITIVE COCCI IN CHAINS RARE GRAM POSITIVE RODS RARE GRAM NEGATIVE RODS    Culture   Final    RARE STAPHYLOCOCCUS EPIDERMIDIS RARE VIRIDANS STREPTOCOCCUS Standardized susceptibility testing for this organism is not available. HOLDING FOR POSSIBLE ANAEROBE Performed at [facility], [address] [place], [place] [address]    Report Status PENDING  Incomplete   Organism ID, Bacteria STAPHYLOCOCCUS EPIDERMIDIS  Final      Susceptibility   Staphylococcus epidermidis - MIC*    CIPROFLOXACIN <=0.5 SENSITIVE Sensitive  ERYTHROMYCIN >=8 RESISTANT Resistant     GENTAMICIN <=0.5 SENSITIVE Sensitive     OXACILLIN >=4 RESISTANT Resistant     TETRACYCLINE  2 SENSITIVE Sensitive     VANCOMYCIN  2 SENSITIVE Sensitive     TRIMETH/SULFA <=10 SENSITIVE Sensitive     CLINDAMYCIN <=0.25 SENSITIVE Sensitive     RIFAMPIN <=0.5 SENSITIVE Sensitive     Inducible Clindamycin NEGATIVE Sensitive     * RARE STAPHYLOCOCCUS EPIDERMIDIS         Radiology Studies: No results found.       Scheduled Meds:  feeding supplement  237 mL Oral TID BM   pantoprazole  (PROTONIX ) IV  40 mg Intravenous Q12H   sodium chloride  flush  3 mL Intravenous Q12H   sucralfate   1 g Oral TID WC & HS   Continuous Infusions:  fluconazole  (DIFLUCAN ) IV Stopped (11/28/23 0104)   piperacillin -tazobactam (ZOSYN )  IV 12.5 mL/hr at 11/28/23 1029     LOS: 6 days    Time spent: 35 minutes    Lajuanna Pompa A Rheya Minogue, MD [facility]   If 7PM-7AM, please contact night-coverage [email or web address]  11/28/2023, 1:40 PM

## 2023-11-28 NOTE — Progress Notes (Signed)
 Pharmacy Antibiotic Note  Henry Kramer is a 53 y.o. male admitted on 11/22/2023 with  gastric perforation/ mass .  Pharmacy has been consulted for vancomycin  dosing.  Abscess culture with staph epi showing oxacillin resistance and viridians strep. WBC wnl, afebrile. Antibiotics being changed to vancomycin   Plan: Vancomycin  1250 mg x 1  Then start vancomycin  750 mg IV every 12 hours (eAUC 534, Scr 0.93, TBW, vd 0.72)  Monitor renal function, cultures, and overall clinical picture De-escalate as able   Height: 6' (182.9 cm) Weight: 58.1 kg (128 lb 1.6 oz) IBW/kg (Calculated) : 77.6  Temp (24hrs), Avg:98.1 F (36.7 C), Min:97.8 F (36.6 C), Max:98.9 F (37.2 C)  Recent Labs  Lab 11/23/23 0452 11/24/23 0434 11/25/23 0431 11/26/23 0350 11/27/23 0403 11/28/23 0847  WBC 8.6 5.8 4.7 7.5  --  6.6  CREATININE 0.97 1.17 1.04 0.95 1.00 0.93    Estimated Creatinine Clearance: 75.5 mL/min (by C-G formula based on SCr of 0.93 mg/dL).    No Known Allergies  Antimicrobials this admission: Zosyn  1/5 >> 1/11 Vancomycin  1/11 >>   Microbiology results: 1/8 Abscess cx: staph epi (R to oxacillin)  Thank you for allowing pharmacy to be a part of this patient's care.  Hubert LILLETTE Ruths 11/28/2023 2:04 PM

## 2023-11-28 NOTE — Plan of Care (Signed)

## 2023-11-29 LAB — CBC
HCT: 31 % — ABNORMAL LOW (ref 39.0–52.0)
Hemoglobin: 10.6 g/dL — ABNORMAL LOW (ref 13.0–17.0)
MCH: 26.6 pg (ref 26.0–34.0)
MCHC: 34.2 g/dL (ref 30.0–36.0)
MCV: 77.9 fL — ABNORMAL LOW (ref 80.0–100.0)
Platelets: 530 10*3/uL — ABNORMAL HIGH (ref 150–400)
RBC: 3.98 MIL/uL — ABNORMAL LOW (ref 4.22–5.81)
RDW: 16 % — ABNORMAL HIGH (ref 11.5–15.5)
WBC: 6 10*3/uL (ref 4.0–10.5)
nRBC: 0 % (ref 0.0–0.2)

## 2023-11-29 LAB — BASIC METABOLIC PANEL
Anion gap: 8 (ref 5–15)
BUN: 12 mg/dL (ref 6–20)
CO2: 25 mmol/L (ref 22–32)
Calcium: 8.4 mg/dL — ABNORMAL LOW (ref 8.9–10.3)
Chloride: 105 mmol/L (ref 98–111)
Creatinine, Ser: 0.82 mg/dL (ref 0.61–1.24)
GFR, Estimated: >60 mL/min (ref >60)
Glucose, Bld: 90 mg/dL (ref 70–99)
Potassium: 3.8 mmol/L (ref 3.5–5.1)
Sodium: 138 mmol/L (ref 135–145)

## 2023-11-29 NOTE — Progress Notes (Signed)
 Assessment & Plan: Perigastric fluid collection, consistent with contained gastric perforation Gastric tumor  - repeat CT scan 1/5 >> no discrete oral contrast but increased density compared to prior scan, cannot exclude oral contrast in fluid collection. - s/p EGD 1/7 by Dr. 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 Path: antral ulcer w/ gastritis with lymphoid aggregates, negative for dysplasia/carcinoma; path: subepithelial mass positive for h pylori, negative for dysplasia/carcinoma; stomach biopsy chronic gastritis with foveolar hyperplasia/intestinal metaplasia, no dysplasia/carcinoma. - h pylori tx x 14 days, GI planning outpatient follow up - s/p CT 1/7 showing contained perforation anterior to proximal stomach, s/p IR aspiration 1/8. GS w/ GPC, GNR, GPR. cultures w/ rare staph epidermis. Cytology no malignant cells - cont IV abx therapy/PPI therapy - tolerating soft diet - [facility] will follow closely with you, no current indications for surgery.    FEN - soft, ensure VTE - scds ID - zosyn /diflucan    Anemia Chronic nausea  GERD Tobacco abuse  Patient comfortable today.  Sharp pain has resolved - no pain with deep inspiration now.  Tolerating diet.  No complaints.         Krystal Spinner, MD [facility] practice Office: [phone]        Chief Complaint: Abd pain, gastric mass  Subjective: Patient comfortable this AM, no complaints  Objective: Vital signs in last 24 hours: Temp:  [97.7 F (36.5 C)-98.9 F (37.2 C)] 97.7 F (36.5 C) (01/12 0846) Pulse Rate:  [59-70] 61 (01/12 0846) Resp:  [16-20] 16 (01/12 0846) BP: (107-114)/(64-72) 109/72 (01/12 0846) SpO2:  [98 %-100 %] 98 % (01/12 0846) Weight:  [56.6 kg] 56.6 kg (01/12 0601) Last BM Date : 11/28/23  Intake/Output from previous day: 01/11 0701 - 01/12 0700 In: 1902.6 [P.O.:1196; IV Piggyback:706.6] Out:  2000 [Urine:2000] Intake/Output this shift: No intake/output data recorded.  Physical Exam: HEENT - sclerae clear, mucous membranes moist Abdomen - soft, scaphoid; non-tender; no mass Ext - no edema, non-tender  Lab Results:  Recent Labs    11/28/23 0847 11/29/23 0802  WBC 6.6 6.0  HGB 10.4* 10.6*  HCT 30.1* 31.0*  PLT 560* 530*   BMET Recent Labs    11/28/23 0847 11/29/23 0802  NA 137 138  K 3.7 3.8  CL 104 105  CO2 27 25  GLUCOSE 105* 90  BUN 12 12  CREATININE 0.93 0.82  CALCIUM  8.0* 8.4*   PT/INR No results for input(s): LABPROT, INR in the last 72 hours. Comprehensive Metabolic Panel:    Component Value Date/Time   NA 138 11/29/2023 0802   NA 137 11/28/2023 0847   K 3.8 11/29/2023 0802   K 3.7 11/28/2023 0847   CL 105 11/29/2023 0802   CL 104 11/28/2023 0847   CO2 25 11/29/2023 0802   CO2 27 11/28/2023 0847   BUN 12 11/29/2023 0802   BUN 12 11/28/2023 0847   CREATININE 0.82 11/29/2023 0802   CREATININE 0.93 11/28/2023 0847   CREATININE 1.11 04/21/2014 1032   GLUCOSE 90 11/29/2023 0802   GLUCOSE 105 (H) 11/28/2023 0847   CALCIUM  8.4 (L) 11/29/2023 0802   CALCIUM  8.0 (L) 11/28/2023 0847   AST 14 (L) 11/26/2023 0350   AST 20 11/25/2023 0431   ALT 42 11/26/2023 0350   ALT 54 (H) 11/25/2023 0431   ALKPHOS 89 11/26/2023 0350   ALKPHOS 99 11/25/2023 0431   BILITOT 0.3 11/26/2023 0350  BILITOT 0.3 11/25/2023 0431   PROT 5.9 (L) 11/26/2023 0350   PROT 6.1 (L) 11/25/2023 0431   ALBUMIN 2.2 (L) 11/26/2023 0350   ALBUMIN 2.0 (L) 11/25/2023 0431    Studies/Results: No results found.    Krystal Spinner 11/29/2023  Patient ID: Henry Kramer, male   DOB: May 07, 1971, 53 y.o.   MRN: [id number]

## 2023-11-29 NOTE — Progress Notes (Signed)
 PROGRESS NOTE    Henry Kramer  FMW:[id number] DOB: October 16, 1971 DOA: 11/22/2023 PCP: Scarlett Ronal Caldron, NP   Brief Narrative: 53 year old with past medical history significant for GERD, chronic nausea, substance abuse, homelessness has been taking NSAIDs presents with abdominal pain, nausea.  CT abdomen and pelvis concerning for gastric wall thickening with gas and fluid collection which could reflect contained perforated gastric ulcer and concern for malignancy, since he has lost 40 pounds weight recently.  Patient was started on IV Zosyn  and Diflucan .  Status post endoscopy and aspiration of the fluid.     Assessment & Plan:   Principal Problem:   Gastric ulcer with perforation (HCC) Active Problems:   Abdominal pain, chronic, epigastric   Loss of weight   Abnormal CT scan, gastrointestinal tract   Abnormal CT of the abdomen  1-Contained  Gastric perforation/gastric mass -Concern for gastric malignancy/gastritis. -CT 1/05: Wall thickening involving the gastric fundus, gas and fluid collection between the proximal stomach measuring 6.1 x 1.4 x 4.1 cm.  Finding concerning for gastric ulceration with contained perforation. -Underwent endoscopy 11/24/2023 found to have large nonbleeding ulcer but no perforation.  Also had a large mass which was biopsied. -Continue  with IV Protonix ,  Diflucan  -Underwent CT with contrast 7/25 showing contained perforation anterior to the proximal stomach, general surgery ER consulted.  Patient underwent aspiration of 40 cc of fluid, he developed a small pneumothorax postprocedure which appeared to have resolved and x-ray the following morning. -Culture growing;  Staph epidermis, Streptococcus Viridans.  -Biopsy came back positive for H. pylori, stomach subepithelia mass: active gastritis,  negative fro metaplasia or dysplasia or carcinoma. Stomach Biopsy: focally active gastritis with lymphoid aggregates, foveolar  hyperplasia and extensive intestinal  metaplasia  Awaiting surgery recommendation.  Started  H pylori tx Pylera.  Started  Vancomycin  1/11, staph epi oxacillin resistant.  Follow Sx recommendation in regard discharge planning.   2-Anemia of chronic disease Hb stable.   Thrombocytosis.  In setting infection,. Monitor.   GERD: PPI  History of substance abuse homelessness counseling   Moderate Protein caloric Malnutrition.  On supplement.        Nutrition Problem: Severe Malnutrition Etiology: nausea, vomiting, acute illness, chronic illness    Signs/Symptoms: per patient/family report, percent weight loss Percent weight loss: 9 %    Interventions: Boost Breeze  Estimated body mass index is 16.91 kg/m as calculated from the following:   Height as of this encounter: 6' (1.829 m).   Weight as of this encounter: 56.6 kg.   DVT prophylaxis: SCD Code Status: Full code Family Communication:  Disposition Plan:  Status is: Inpatient Remains inpatient appropriate because: management of contained perforation.     Consultants:  GI Sx  Procedures:  Endoscopy   Antimicrobials:    Subjective: Tolerating regular diet. Abdominal pain not worse.    Objective: Vitals:   11/28/23 2041 11/29/23 0601 11/29/23 0846 11/29/23 1538  BP: 107/65 114/70 109/72 125/73  Pulse: 70 (!) 59 61 65  Resp: 18 20 16 18   Temp: 98.6 F (37 C) 98.3 F (36.8 C) 97.7 F (36.5 C) 97.7 F (36.5 C)  TempSrc: Oral Oral Oral Oral  SpO2: 100% 99% 98% 100%  Weight:  56.6 kg    Height:        Intake/Output Summary (Last 24 hours) at 11/29/2023 1545 Last data filed at 11/29/2023 1540 Gross per 24 hour  Intake 2148.16 ml  Output 2050 ml  Net 98.16 ml   [employer]  11/27/23 0420 11/28/23 0407 11/29/23 0601  Weight: 57 kg 58.1 kg 56.6 kg    Examination:  General exam: NAD Respiratory system: CTA Cardiovascular system: S 1, S 2 RRR Gastrointestinal system:  BS present, soft, nt Central nervous system:  Alert Extremities: No edema   Data Reviewed: I have personally reviewed following labs and imaging studies  CBC: Recent Labs  Lab 11/24/23 0434 11/25/23 0431 11/26/23 0350 11/28/23 0847 11/29/23 0802  WBC 5.8 4.7 7.5 6.6 6.0  NEUTROABS 3.0  --   --   --   --   HGB 11.0* 10.9* 11.2* 10.4* 10.6*  HCT 32.7* 31.8* 32.7* 30.1* 31.0*  MCV 77.3* 77.2* 76.9* 77.2* 77.9*  PLT 682* 646* 663* 560* 530*   Basic Metabolic Panel: Recent Labs  Lab 11/24/23 0434 11/25/23 0431 11/26/23 0350 11/27/23 0403 11/28/23 0847 11/29/23 0802  NA 142 136 135 139 137 138  K 4.1 3.6 3.5 4.1 3.7 3.8  CL 106 103 103 105 104 105  CO2 25 26 24 25 27 25   GLUCOSE 88 113* 94 86 105* 90  BUN 29* 19 12 9 12 12   CREATININE 1.17 1.04 0.95 1.00 0.93 0.82  CALCIUM  8.5* 8.1* 8.0* 8.2* 8.0* 8.4*  MG 2.5*  --   --   --   --   --    GFR: Estimated Creatinine Clearance: 83.4 mL/min (by C-G formula based on SCr of 0.82 mg/dL). Liver Function Tests: Recent Labs  Lab 11/23/23 0452 11/24/23 0434 11/25/23 0431 11/26/23 0350  AST 53* 34 20 14*  ALT 87* 73* 54* 42  ALKPHOS 134* 116 99 89  BILITOT 0.4 0.5 0.3 0.3  PROT 6.7 6.5 6.1* 5.9*  ALBUMIN 2.2* 2.1* 2.0* 2.2*   No results for input(s): LIPASE, AMYLASE in the last 168 hours.  No results for input(s): AMMONIA in the last 168 hours. Coagulation Profile: No results for input(s): INR, PROTIME in the last 168 hours. Cardiac Enzymes: No results for input(s): CKTOTAL, CKMB, CKMBINDEX, TROPONINI in the last 168 hours. BNP (last 3 results) No results for input(s): PROBNP in the last 8760 hours. HbA1C: No results for input(s): HGBA1C in the last 72 hours. CBG: Recent Labs  Lab 11/23/23 1637  GLUCAP 94   Lipid Profile: No results for input(s): CHOL, HDL, LDLCALC, TRIG, CHOLHDL, LDLDIRECT in the last 72 hours. Thyroid  Function Tests: No results for input(s): TSH, T4TOTAL, FREET4, T3FREE, THYROIDAB in the last  72 hours. Anemia Panel: No results for input(s): VITAMINB12, FOLATE, FERRITIN, TIBC, IRON, RETICCTPCT in the last 72 hours. Sepsis Labs: No results for input(s): PROCALCITON, LATICACIDVEN in the last 168 hours.  Recent Results (from the past 240 hours)  Aerobic/Anaerobic Culture w Gram Stain (surgical/deep wound)     Status: None (Preliminary result)   Collection Time: 11/25/23  2:40 PM   Specimen: Abscess  Result Value Ref Range Status   Specimen Description ABSCESS  Final   Special Requests GASTRIC WALL  Final   Gram Stain   Final    NO WBC SEEN RARE GRAM POSITIVE COCCI IN CHAINS RARE GRAM POSITIVE RODS RARE GRAM NEGATIVE RODS    Culture   Final    RARE STAPHYLOCOCCUS EPIDERMIDIS RARE VIRIDANS STREPTOCOCCUS Standardized susceptibility testing for this organism is not available. HOLDING FOR POSSIBLE ANAEROBE Performed at [facility], [address] [place], [place] [address]    Report Status PENDING  Incomplete   Organism ID, Bacteria STAPHYLOCOCCUS EPIDERMIDIS  Final      Susceptibility  Staphylococcus epidermidis - MIC*    CIPROFLOXACIN <=0.5 SENSITIVE Sensitive     ERYTHROMYCIN >=8 RESISTANT Resistant     GENTAMICIN <=0.5 SENSITIVE Sensitive     OXACILLIN >=4 RESISTANT Resistant     TETRACYCLINE  2 SENSITIVE Sensitive     VANCOMYCIN  2 SENSITIVE Sensitive     TRIMETH/SULFA <=10 SENSITIVE Sensitive     CLINDAMYCIN <=0.25 SENSITIVE Sensitive     RIFAMPIN <=0.5 SENSITIVE Sensitive     Inducible Clindamycin NEGATIVE Sensitive     * RARE STAPHYLOCOCCUS EPIDERMIDIS         Radiology Studies: No results found.       Scheduled Meds:  bismuth  subsalicylate  524 mg Oral Q6H   feeding supplement  237 mL Oral TID BM   metroNIDAZOLE   500 mg Oral Q6H   pantoprazole  (PROTONIX ) IV  40 mg Intravenous Q12H   sodium chloride  flush  3 mL Intravenous Q12H   sucralfate   1 g Oral TID WC & HS   tetracycline   500 mg Oral Q6H   Continuous Infusions:   fluconazole  (DIFLUCAN ) IV 400 mg (11/28/23 2336)   vancomycin  750 mg (11/29/23 0611)     LOS: 7 days    Time spent: 35 minutes    Laine Giovanetti A Kinsey Cowsert, MD [facility]   If 7PM-7AM, please contact night-coverage [email or web address]  11/29/2023, 3:45 PM

## 2023-11-29 NOTE — Plan of Care (Signed)
  Problem: Health Behavior/Discharge Planning: Goal: Ability to manage health-related needs will improve Outcome: Progressing   Problem: Clinical Measurements: Goal: Will remain free from infection Outcome: Progressing Goal: Diagnostic test results will improve Outcome: Progressing   Problem: Nutrition: Goal: Adequate nutrition will be maintained Outcome: Progressing   Problem: Pain Management: Goal: General experience of comfort will improve Outcome: Progressing   Problem: Safety: Goal: Ability to remain free from injury will improve Outcome: Progressing

## 2023-11-30 ENCOUNTER — Other Ambulatory Visit (HOSPITAL_COMMUNITY): Payer: Self-pay

## 2023-11-30 LAB — CBC
HCT: 30.1 % — ABNORMAL LOW (ref 39.0–52.0)
Hemoglobin: 10.1 g/dL — ABNORMAL LOW (ref 13.0–17.0)
MCH: 26.2 pg (ref 26.0–34.0)
MCHC: 33.6 g/dL (ref 30.0–36.0)
MCV: 78 fL — ABNORMAL LOW (ref 80.0–100.0)
Platelets: 520 10*3/uL — ABNORMAL HIGH (ref 150–400)
RBC: 3.86 MIL/uL — ABNORMAL LOW (ref 4.22–5.81)
RDW: 16.1 % — ABNORMAL HIGH (ref 11.5–15.5)
WBC: 5.8 10*3/uL (ref 4.0–10.5)
nRBC: 0 % (ref 0.0–0.2)

## 2023-11-30 LAB — AEROBIC/ANAEROBIC CULTURE W GRAM STAIN (SURGICAL/DEEP WOUND): Gram Stain: NONE SEEN

## 2023-11-30 LAB — BASIC METABOLIC PANEL
Anion gap: 9 (ref 5–15)
BUN: 17 mg/dL (ref 6–20)
CO2: 25 mmol/L (ref 22–32)
Calcium: 8.3 mg/dL — ABNORMAL LOW (ref 8.9–10.3)
Chloride: 102 mmol/L (ref 98–111)
Creatinine, Ser: 1.1 mg/dL (ref 0.61–1.24)
GFR, Estimated: >60 mL/min (ref >60)
Glucose, Bld: 93 mg/dL (ref 70–99)
Potassium: 3.5 mmol/L (ref 3.5–5.1)
Sodium: 136 mmol/L (ref 135–145)

## 2023-11-30 MED ORDER — CEFADROXIL 500 MG PO CAPS
1000.0000 mg | ORAL_CAPSULE | Freq: Two times a day (BID) | ORAL | 0 refills | Status: AC
  Filled 2023-11-30: qty 56, 14d supply, fill #0

## 2023-11-30 MED ORDER — TETRACYCLINE HCL 500 MG PO CAPS
500.0000 mg | ORAL_CAPSULE | Freq: Four times a day (QID) | ORAL | 0 refills | Status: AC
  Filled 2023-11-30: qty 56, 14d supply, fill #0

## 2023-11-30 MED ORDER — PANTOPRAZOLE SODIUM 40 MG PO TBEC
40.0000 mg | DELAYED_RELEASE_TABLET | Freq: Two times a day (BID) | ORAL | 1 refills | Status: AC
  Filled 2023-11-30: qty 30, 15d supply, fill #0

## 2023-11-30 MED ORDER — METRONIDAZOLE 500 MG PO TABS
500.0000 mg | ORAL_TABLET | Freq: Four times a day (QID) | ORAL | 0 refills | Status: AC
  Filled 2023-11-30: qty 48, 12d supply, fill #0

## 2023-11-30 MED ORDER — ALBUTEROL SULFATE HFA 108 (90 BASE) MCG/ACT IN AERS
1.0000 | INHALATION_SPRAY | Freq: Four times a day (QID) | RESPIRATORY_TRACT | 0 refills | Status: AC | PRN
  Filled 2023-11-30: qty 6.7, 25d supply, fill #0

## 2023-11-30 MED ORDER — BISMUTH SUBSALICYLATE 262 MG PO CHEW
524.0000 mg | CHEWABLE_TABLET | Freq: Four times a day (QID) | ORAL | 0 refills | Status: AC
  Filled 2023-11-30: qty 30, 4d supply, fill #0

## 2023-11-30 MED ORDER — CEFADROXIL 500 MG PO CAPS
1000.0000 mg | ORAL_CAPSULE | Freq: Two times a day (BID) | ORAL | Status: DC
  Administered 2023-11-30: 1000 mg via ORAL
  Filled 2023-11-30 (×2): qty 2

## 2023-11-30 MED ORDER — SUCRALFATE 1 G PO TABS
1.0000 g | ORAL_TABLET | Freq: Three times a day (TID) | ORAL | 0 refills | Status: AC
  Filled 2023-11-30: qty 120, 30d supply, fill #0

## 2023-11-30 NOTE — Plan of Care (Signed)
  Problem: Education: Goal: Knowledge of General Education information will improve Description: Including pain rating scale, medication(s)/side effects and non-pharmacologic comfort measures Outcome: Progressing   Problem: Health Behavior/Discharge Planning: Goal: Ability to manage health-related needs will improve Outcome: Progressing   Problem: Clinical Measurements: Goal: Ability to maintain clinical measurements within normal limits will improve Outcome: Progressing Goal: Diagnostic test results will improve Outcome: Progressing Goal: Cardiovascular complication will be avoided Outcome: Progressing   Problem: Activity: Goal: Risk for activity intolerance will decrease Outcome: Progressing   Problem: Nutrition: Goal: Adequate nutrition will be maintained Outcome: Progressing   Problem: Coping: Goal: Level of anxiety will decrease Outcome: Progressing   Problem: Elimination: Goal: Will not experience complications related to bowel motility Outcome: Progressing Goal: Will not experience complications related to urinary retention Outcome: Progressing   Problem: Pain Management: Goal: General experience of comfort will improve Outcome: Progressing   Problem: Safety: Goal: Ability to remain free from injury will improve Outcome: Progressing   Problem: Skin Integrity: Goal: Risk for impaired skin integrity will decrease Outcome: Progressing

## 2023-11-30 NOTE — Plan of Care (Signed)
 Discharge instructions discussed with patient.  Patient instructed on home medications, restrictions, and follow up appointments. Belongings gathered and sent with patient.  Patients medications picked up at [facility] pharmacy.    Problem: Education: Goal: Knowledge of General Education information will improve Description: Including pain rating scale, medication(s)/side effects and non-pharmacologic comfort measures Outcome: Adequate for Discharge   Problem: Health Behavior/Discharge Planning: Goal: Ability to manage health-related needs will improve Outcome: Adequate for Discharge   Problem: Clinical Measurements: Goal: Ability to maintain clinical measurements within normal limits will improve Outcome: Adequate for Discharge Goal: Will remain free from infection Outcome: Adequate for Discharge Goal: Diagnostic test results will improve Outcome: Adequate for Discharge Goal: Cardiovascular complication will be avoided Outcome: Adequate for Discharge   Problem: Activity: Goal: Risk for activity intolerance will decrease Outcome: Adequate for Discharge   Problem: Nutrition: Goal: Adequate nutrition will be maintained Outcome: Adequate for Discharge   Problem: Coping: Goal: Level of anxiety will decrease Outcome: Adequate for Discharge   Problem: Elimination: Goal: Will not experience complications related to bowel motility Outcome: Adequate for Discharge Goal: Will not experience complications related to urinary retention Outcome: Adequate for Discharge   Problem: Pain Management: Goal: General experience of comfort will improve Outcome: Adequate for Discharge   Problem: Safety: Goal: Ability to remain free from injury will improve Outcome: Adequate for Discharge

## 2023-11-30 NOTE — Progress Notes (Signed)
 Subjective: Doing much better than on admit.  Pain improved.  Tolerating solid diet with no n/v.  Loose stool some, slightly dark.  ROS: See above, otherwise other systems negative  Objective: Vital signs in last 24 hours: Temp:  [97.7 F (36.5 C)-98 F (36.7 C)] 98 F (36.7 C) (01/13 0508) Pulse Rate:  [56-65] 56 (01/13 0508) Resp:  [16-18] 16 (01/13 0508) BP: (109-125)/(65-73) 109/69 (01/13 0508) SpO2:  [95 %-100 %] 96 % (01/13 0508) Weight:  [57.4 kg] 57.4 kg (01/13 0508) Last BM Date : 11/28/23  Intake/Output from previous day: 01/12 0701 - 01/13 0700 In: 2622.3 [P.O.:1734; IV Piggyback:888.3] Out: 2650 [Urine:2650] Intake/Output this shift: Total I/O In: 236 [P.O.:236] Out: -   PE: Gen: NAD, pleasant, laying in bed Lungs: efoort nonlabored Abd: soft, nondistended, NT  Lab Results:  Recent Labs    11/29/23 0802 11/30/23 0759  WBC 6.0 5.8  HGB 10.6* 10.1*  HCT 31.0* 30.1*  PLT 530* 520*   BMET Recent Labs    11/29/23 0802 11/30/23 0759  NA 138 136  K 3.8 3.5  CL 105 102  CO2 25 25  GLUCOSE 90 93  BUN 12 17  CREATININE 0.82 1.10  CALCIUM  8.4* 8.3*   PT/INR No results for input(s): LABPROT, INR in the last 72 hours. CMP     Component Value Date/Time   NA 136 11/30/2023 0759   K 3.5 11/30/2023 0759   CL 102 11/30/2023 0759   CO2 25 11/30/2023 0759   GLUCOSE 93 11/30/2023 0759   BUN 17 11/30/2023 0759   CREATININE 1.10 11/30/2023 0759   CREATININE 1.11 04/21/2014 1032   CALCIUM  8.3 (L) 11/30/2023 0759   PROT 5.9 (L) 11/26/2023 0350   ALBUMIN 2.2 (L) 11/26/2023 0350   AST 14 (L) 11/26/2023 0350   ALT 42 11/26/2023 0350   ALKPHOS 89 11/26/2023 0350   BILITOT 0.3 11/26/2023 0350   GFRNONAA >60 11/30/2023 0759   GFRNONAA 81 04/21/2014 1032   GFRAA >60 03/31/2018 0640   GFRAA >89 04/21/2014 1032   Lipase     Component Value Date/Time   LIPASE 47 11/22/2023 0933       Studies/Results: No results  found.   Anti-infectives: Anti-infectives (From admission, onward)    Start     Dose/Rate Route Frequency Ordered Stop   11/29/23 0500  vancomycin  (VANCOCIN ) 750 mg in sodium chloride  0.9 % 250 mL IVPB        750 mg 265 mL/hr over 60 Minutes Intravenous Every 12 hours 11/28/23 1726     11/29/23 0400  vancomycin  (VANCOREADY) IVPB 750 mg/150 mL  Status:  Discontinued        750 mg 150 mL/hr over 60 Minutes Intravenous Every 12 hours 11/28/23 1417 11/28/23 1726   11/28/23 1800  metroNIDAZOLE  (FLAGYL ) tablet 500 mg        500 mg Oral Every 6 hours 11/28/23 1400 12/12/23 1759   11/28/23 1515  vancomycin  (VANCOREADY) IVPB 1250 mg/250 mL        1,250 mg 166.7 mL/hr over 90 Minutes Intravenous  Once 11/28/23 1416 11/28/23 1617   11/28/23 1500  tetracycline  (SUMYCIN ) capsule 500 mg        500 mg Oral Every 6 hours 11/28/23 1400 12/12/23 1659   11/22/23 2200  piperacillin -tazobactam (ZOSYN ) IVPB 3.375 g  Status:  Discontinued        3.375 g 12.5 mL/hr over 240 Minutes Intravenous Every 8 hours 11/22/23 1942 11/28/23 1408  11/22/23 2200  fluconazole  (DIFLUCAN ) IVPB 400 mg        400 mg 100 mL/hr over 120 Minutes Intravenous Every 24 hours 11/22/23 2202     11/22/23 1745  fluconazole  (DIFLUCAN ) tablet 150 mg        150 mg Oral  Once 11/22/23 1735 11/22/23 1758   11/22/23 1530  piperacillin -tazobactam (ZOSYN ) IVPB 3.375 g        3.375 g 100 mL/hr over 30 Minutes Intravenous  Once 11/22/23 1520 11/22/23 1635   11/22/23 0000  azithromycin  (ZITHROMAX ) 250 MG tablet        250 mg Oral Daily 11/22/23 1512     11/22/23 0000  amoxicillin -clavulanate (AUGMENTIN ) 875-125 MG tablet        1 tablet Oral Every 12 hours 11/22/23 1512          Assessment/Plan Perigastric fluid collection, consistent with contained gastric perforation 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 - s/p EGD 1/7 by Dr. 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 Path: antral ulcer w/ gastritis with lymphoid aggregates, negative for dysplasia/carcinoma; path: subepithelial mass positive for h pylori, negative for dysplasia/carcinoma; stomach biopsy chronic gastritis with foveolar hyperplasia/intestinal metaplasia, no dysplasia/carcinoma. - h pylori tx x 14 days, GI planning outpatient follow up - s/p CT 1/7 showing contained perforation anterior to proximal stomach, s/p IR aspiration 1/8. GS w/ GPC, GNR, GPR. cultures w/ rate staph epidermis. Cytology no malignant cells - cont abx therapy for 10-14 days/PPI therapy - tolerating soft diet with no issues -surgically stable for DC home with outpatient GI follow up.  No needs for surgical follow up at this time. -d/w primary service.  FEN - soft/ensure VTE - scds ID - [email or web address]   Anemia - unclear etiology, but likely secondary to process in stomach, cont to monitor Chronic nausea  GERD Tobacco abuse - needs to stop smoking at this can lead to ulcer disease as well  I reviewed nursing notes, hospitalist notes, last 24 h vitals and pain scores, last 48 h intake and output, last 24 h labs and trends, and last 24 h imaging results.   LOS: 8 days    Burnard FORBES Banter , [facility] Surgery 11/30/2023, 10:54 AM Please see Amion for pager number during day hours 7:00am-4:30pm or 7:00am -11:30am on weekends

## 2023-11-30 NOTE — Discharge Summary (Addendum)
 Physician Discharge Summary   Patient: Henry Kramer MRN: [id number] DOB: August 12, 1971  Admit date:     11/22/2023  Discharge date: 11/30/23  Discharge Physician: Owen DELENA Lore   PCP: Scarlett Ronal Caldron, NP   Recommendations at discharge:   Needs to follow up with GI , needs repeat Endoscopy   Discharge Diagnoses: Principal Problem:   Gastric ulcer with perforation (HCC) Active Problems:   Abdominal pain, chronic, epigastric   Loss of weight   Abnormal CT scan, gastrointestinal tract   Abnormal CT of the abdomen  Resolved Problems:   * No resolved hospital problems. *  Hospital Course: 53 year old with past medical history significant for GERD, chronic nausea, substance abuse, homelessness has been taking NSAIDs presents with abdominal pain, nausea.  CT abdomen and pelvis concerning for gastric wall thickening with gas and fluid collection which could reflect contained perforated gastric ulcer and concern for malignancy, since he has lost 40 pounds weight recently.  Patient was started on IV Zosyn  and Diflucan .  Status post endoscopy and aspiration of the fluid.        Assessment and Plan: 1-Contained  Gastric perforation/gastric mass -Concern for gastric malignancy/gastritis. -CT 1/05: Wall thickening involving the gastric fundus, gas and fluid collection between the proximal stomach measuring 6.1 x 1.4 x 4.1 cm.  Finding concerning for gastric ulceration with contained perforation. -Underwent endoscopy 11/24/2023 found to have large nonbleeding ulcer but no perforation.  Also had a large mass which was biopsied. -Continue  with IV Protonix ,  Diflucan  -Underwent CT with contrast 7/25 showing contained perforation anterior to the proximal stomach, general surgery ER consulted.  Patient underwent aspiration of 40 cc of fluid, he developed a small pneumothorax postprocedure which appeared to have resolved and x-ray the following morning. -Culture growing;  Staph epidermis,  Streptococcus Viridans.  -Biopsy came back positive for H. pylori, stomach subepithelia mass: active gastritis,  negative fro metaplasia or dysplasia or carcinoma. Stomach Biopsy: focally active gastritis with lymphoid aggregates, foveolar  hyperplasia and extensive intestinal metaplasia  Awaiting surgery recommendation.  Started  H pylori tx Pylera. He will be discharge on Flagyl , Bismuth , tetracycline  for H Pylori.  Started  Vancomycin  1/11, staph epi oxacillin resistant.  Ok to discharge home today. Discussed with ID, plan to discharge on Cefadroxil  (to cover Strept)  and tetracycline  should cover staph.  Discharge on 2 weeks antibiotics.   2-Anemia of chronic disease Hb stable.    Thrombocytosis.  In setting infection,. Monitor.    GERD: PPI   History of substance abuse homelessness counseling     Severe  Protein caloric Malnutrition.  On supplement.              Nutrition Problem: Severe Malnutrition Etiology: nausea, vomiting, acute illness, chronic illness       Signs/Symptoms: per patient/family report, percent weight loss Percent weight loss: 9 %       Interventions: Boost Breeze   Estimated body mass index is 16.91 kg/m as calculated from the following:   Height as of this encounter: 6' (1.829 m).   Weight as of this encounter: 56.6 kg.            Consultants: GI, Surgery  Procedures performed: None Disposition: Home Diet recommendation:  Discharge Diet Orders (From admission, onward)     Start     Ordered   11/30/23 0000  Diet - low sodium heart healthy        11/30/23 1153  Regular diet DISCHARGE MEDICATION: Allergies as of 11/30/2023   No Known Allergies      Medication List     STOP taking these medications    NYQUIL PO       TAKE these medications    albuterol  108 (90 Base) MCG/ACT inhaler Commonly known as: VENTOLIN  HFA Inhale 1-2 puffs into the lungs every 6 (six) hours as needed for wheezing or shortness  of breath.   bismuth  subsalicylate 262 MG chewable tablet Commonly known as: PEPTO BISMOL Chew 2 tablets (524 mg total) by mouth every 6 (six) hours.   cefadroxil  500 MG capsule Commonly known as: DURICEF Take 2 capsules (1,000 mg total) by mouth 2 (two) times daily for 14 days.   metroNIDAZOLE  500 MG tablet Commonly known as: FLAGYL  Take 1 tablet (500 mg total) by mouth every 6 (six) hours for 12 days.   pantoprazole  40 MG tablet Commonly known as: Protonix  Take 1 tablet (40 mg total) by mouth 2 (two) times daily.   sucralfate  1 g tablet Commonly known as: CARAFATE  Take 1 tablet (1 g total) by mouth 4 (four) times daily -  with meals and at bedtime.   tetracycline  500 MG capsule Commonly known as: SUMYCIN  Take 1 capsule (500 mg total) by mouth every 6 (six) hours for 14 days.        Follow-up Information     Placey, Ronal Caldron, NP .   Contact information: [address] [place]  [place] [address] [phone]         Cara Elida HERO, NP Follow up on 12/04/2023.   Specialty: Gastroenterology Why: 930 am. please arrive 15 minutes prior to appt time. Contact information: [address] [place] [address] [phone]                Discharge Exam: Fredricka Weights   11/28/23 0407 11/29/23 0601 11/30/23 0508  Weight: 58.1 kg 56.6 kg 57.4 kg   General; NAD  Condition at discharge: stable  The results of significant diagnostics from this hospitalization (including imaging, microbiology, ancillary and laboratory) are listed below for reference.   Imaging Studies: DG CHEST PORT 1 VIEW Result Date: 11/26/2023 CLINICAL DATA:  Small left basilar pneumothorax after CT-guided aspiration of left upper quadrant abscess yesterday. EXAM: PORTABLE CHEST 1 VIEW COMPARISON:  Images during CT-guided procedure on 11/25/2023. FINDINGS: The heart size and mediastinal contours are within normal limits. No residual left pneumothorax is apparent on radiographic  follow-up. There is no evidence of pulmonary edema, consolidation or pleural fluid. The visualized skeletal structures are unremarkable. IMPRESSION: No residual left pneumothorax apparent on radiographic follow-up. Electronically Signed   By: Marcey Moan M.D.   On: 11/26/2023 08:41   CT GUIDED NEEDLE PLACEMENT Result Date: 11/25/2023 INDICATION: 53 year old male referred for aspiration of left upper quadrant fluid collection EXAM: CT-GUIDED ASPIRATION OF LEFT UPPER QUADRANT FLUID COLLECTION TECHNIQUE: Multidetector CT imaging of the abdomen was performed following the standard protocol without IV contrast. RADIATION DOSE REDUCTION: This exam was performed according to the departmental dose-optimization program which includes automated exposure control, adjustment of the mA and/or kV according to patient size and/or use of iterative reconstruction technique. MEDICATIONS: None ANESTHESIA/SEDATION: Moderate (conscious) sedation was employed during this procedure. A total of Versed  1.5 mg and Fentanyl  50 mcg was administered intravenously by the radiology nurse. Total intra-service moderate Sedation Time: 11 minutes. The patient's level of consciousness and vital signs were monitored continuously by radiology nursing throughout the procedure under my direct supervision. COMPLICATIONS:  SIR LEVEL B - Normal therapy, includes overnight admission for observation. PROCEDURE: Informed written consent was obtained from the patient after a thorough discussion of the procedural risks, benefits and alternatives. All questions were addressed. Maximal Sterile Barrier Technique was utilized including caps, mask, sterile gowns, sterile gloves, sterile drape, hand hygiene and skin antiseptic. A timeout was performed prior to the initiation of the procedure. Patient positioned left anterior oblique on the CT gantry table. Scout CT acquired for planning purposes. The patient is prepped and draped in the usual sterile fashion. 1%  lidocaine  was used for local anesthesia. Using CT guidance, Yueh needle was advanced into the fluid collection of the left upper quadrant. Once we confirmed needle tip position modified Seldinger technique was used to place a 10 French drain. Aspiration of approximately 40-50 cc of fluid was performed with a sample sent for cytology and culture. Drain was removed. Patient tolerated the procedure well and remained hemodynamically stable throughout. No complications were encountered and no significant blood loss. FINDINGS: Fluid was entirely aspirated with collapse of the cavity. Small pneumothorax at the lung base on completion. IMPRESSION: Status post CT-guided aspiration of left upper quadrant fluid with removal of the catheter. Sample sent for cytology and culture. Signed, Ami RAMAN. Alona ROSALEA GRAVER, RPVI [facility] Radiology PLAN: Chest x-ray in the morning. Electronically Signed   By: Ami Alona D.O.   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 * Tracking Code: BO * EXAM: CT CHEST, ABDOMEN, AND PELVIS WITH CONTRAST TECHNIQUE: Multidetector CT imaging of the chest, abdomen and pelvis was performed following the standard protocol during bolus administration of intravenous contrast. RADIATION DOSE REDUCTION: This exam was performed according to the departmental dose-optimization program which includes automated exposure control, adjustment of the mA and/or kV according to patient size and/or use of iterative reconstruction technique. CONTRAST:  55mL OMNIPAQUE  IOHEXOL  350 MG/ML SOLN COMPARISON:  CT examinations of the chest, abdomen, and pelvis from 11/22/2023 FINDINGS: CT CHEST FINDINGS Cardiovascular: Unremarkable Mediastinum/Nodes: Unremarkable Lungs/Pleura: Airway thickening is present, suggesting bronchitis or reactive airways disease. Substantial  airway plugging and occlusion in both lower lobes likely from mucus. No focal lung mass or suspicious lung nodule identified. Musculoskeletal: Unremarkable CT ABDOMEN PELVIS FINDINGS Streak artifact from arm positioning mildly reduces diagnostic sensitivity. Hepatobiliary: The abdomen is imaged in the early arterial phase, prior to hepatic venous or portal venous opacification. 2.6 cm arterial phase enhancing lesion in the lateral segment left hepatic lobe on image 57 of series 3 is technically nonspecific on today's examination, but was present and thought to represent a hemangioma on 08/11/2016. Similarly a 2.0 cm lesion anteriorly in the left hepatic lobe on image 61 of series 3 was likewise present previously and probably a hemangioma. Gallbladder unremarkable. Pancreas: Unremarkable Spleen: Unremarkable Adrenals/Urinary Tract: Unremarkable Stomach/Bowel: Irregular gastric wall thickening proximally noted with a contained 5.0 by 4.1 by 3.8 cm collection anterior to the proximal stomach on image 56 series 3. Locally contained perforation a gastric mass is the top differential diagnostic consideration. There is also some gastric wall thickening near the junction of the stomach and antrum. This may correspond to the antral ulceration seen on endoscopy. There is a paucity of adipose tissue in the abdomen. Orally administered contrast medium extends through to the rectum. Vascular/Lymphatic: No significant atherosclerosis. No pathologic adenopathy identified. Reproductive: Mild prostatomegaly. Other: No supplemental non-categorized findings. Musculoskeletal: Degenerative  disc disease at L3-4 IMPRESSION: 1. Irregular gastric wall thickening proximally with a contained 5.0 by 4.1 by 3.8 cm collection anterior to the proximal stomach. Locally contained perforation related to a gastric mass is the top differential diagnostic consideration. There is also some gastric wall thickening near the junction of the stomach and  antrum. This may correspond to the antral ulceration seen on endoscopy. 2. No findings of metastatic disease identified. Left hepatic lobe enhancing lesions are attributable to the hemangiomas shown on the 08/11/2016 MRI. 3. Airway thickening is present, suggesting bronchitis or reactive airways disease. Substantial airway plugging and occlusion in both lower lobes likely from mucus. 4. Mild prostatomegaly. 5. 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 Electronically Signed   By: Ryan Salvage M.D.   On: 11/24/2023 21:24   CT ABDOMEN PELVIS W CONTRAST Result Date: 11/22/2023 CLINICAL DATA:  Duodenal ulcer evaluation for gastric perforation. EXAM: CT ABDOMEN AND PELVIS WITH CONTRAST TECHNIQUE: Multidetector CT imaging of the abdomen and pelvis was performed using the standard protocol following bolus administration of intravenous contrast. RADIATION DOSE REDUCTION: This exam was performed according to the departmental dose-optimization program which includes automated exposure control, adjustment of the mA and/or kV according to patient size and/or use of iterative reconstruction technique. CONTRAST:  50mL OMNIPAQUE  IOHEXOL  350 MG/ML SOLN COMPARISON:  CT 11/22/2023 at 2:19 p.m. FINDINGS: Lower chest: No acute abnormality. Hepatobiliary: Scattered hemangiomas. No acute abnormality. Normal gallbladder and biliary tree. Pancreas: Unremarkable. Spleen: Unremarkable. Adrenals/Urinary Tract: Normal adrenal glands. No urinary calculi or hydronephrosis. Contrast from prior CT within the ureters and bladder. Stomach/Bowel: Redemonstrated gastric wall thickening about the fundus. Gas and fluid collection with air-fluid level protruding from the gastric fundus measures 5.5 x 4.7 cm, grossly similar to earlier today. Enteric contrast is present within the stomach and small bowel. No discrete oral contrast is visualized within the gas and fluid collection however the overall density has increased compared to earlier today  (Hounsfield units 38 versus 8 on the previous study). Soft tissue edema and stranding within the lesser sac. No bowel obstruction.  The appendix is not confidently visualized. Vascular/Lymphatic: No significant vascular findings are present. No enlarged abdominal or pelvic lymph nodes. Reproductive: Enlarged prostate. Other: No free intraperitoneal air. Musculoskeletal: No acute fracture. IMPRESSION: Findings remain concerning for gastric ulceration with contained perforation. No discrete oral contrast is visualized within the gas and fluid collection however the overall density has increased compared to earlier today which may be due to dilute oral contrast within the collection. Primary differential consideration is gastric diverticulitis. Electronically Signed   By: Norman Gatlin M.D.   On: 11/22/2023 21:25   CT ABDOMEN PELVIS W CONTRAST Result Date: 11/22/2023 CLINICAL DATA:  Left flank pain. EXAM: CT ABDOMEN AND PELVIS WITH CONTRAST TECHNIQUE: Multidetector CT imaging of the abdomen and pelvis was performed using the standard protocol following bolus administration of intravenous contrast. RADIATION DOSE REDUCTION: This exam was performed according to the departmental dose-optimization program which includes automated exposure control, adjustment of the mA and/or kV according to patient size and/or use of iterative reconstruction technique. CONTRAST:  75mL OMNIPAQUE  IOHEXOL  350 MG/ML SOLN COMPARISON:  MRI 08/11/2016 FINDINGS: Lower chest: No pleural effusion or consolidative change identified. Hepatobiliary: Previously characterized liver hemangiomas are again identified within the anterior right lobe and far lateral left lobe. The left lobe of liver hemangioma measures 2.8 cm, image 1.6/3. Anterior right hepatic lobe hemangioma measures 1.1 cm, image 119/3. Gallbladder appears normal. No bile duct dilatation Pancreas: Unremarkable. No pancreatic  ductal dilatation or surrounding inflammatory changes.  Spleen: Normal in size without focal abnormality. Adrenals/Urinary Tract: Normal adrenal glands. No nephrolithiasis, hydronephrosis or suspicious mass. Urinary bladder appears normal. Stomach/Bowel: Assessment of bowel pathology is diminished due to lack of enteric contrast material. There is abnormal wall thickening involving the gastric fundus there is a gas and fluid collection between the proximal stomach measuring 6.1 x 1.4 by 4.1 cm. There is an air-fluid level within this fluid collection which may communicate with the lumen of the stomach, image 20/3. This area is difficult to scratch edema and soft tissue stranding is noted within the lesser sac, there is diffuse edema identified within the lesser sac. The small bowel loops appear nondilated. Scattered mildly dilated loops of small bowel are identified which measure up to 2.2 cm. Lack of enteric contrast material and paucity of visceral fat limits assessment for the appendix which is not confidently identified. Vascular/Lymphatic: Normal caliber of the abdominal aorta. No adenopathy identified. Reproductive: Prostate gland enlargement. Other: No significant free fluid identified. No signs of free perforation. Musculoskeletal: No acute or significant osseous findings. IMPRESSION: 1. There is abnormal wall thickening involving the gastric fundus. There is a gas and fluid collection between the proximal stomach measuring 6.1 x 1.4 x 4.1 cm. There is an air-fluid level within this fluid collection which may communicate with the lumen of the stomach. Findings are concerning for gastric ulceration with contained perforation. Consider further evaluation with repeat CT of the abdomen following oral contrast and IV contrast administration. 2. Scattered mildly dilated loops of small bowel are identified which measure up to 2.2 cm. Findings are favored to represent ileus. 3. Lack of enteric contrast material and paucity of visceral fat limits assessment for the  appendix which is not confidently identified. 4. Prostate gland enlargement. 5. Previously characterized liver hemangiomas are again identified. Electronically Signed   By: Waddell Calk M.D.   On: 11/22/2023 15:11   CT Angio Chest PE W and/or Wo Contrast Result Date: 11/22/2023 CLINICAL DATA:  Shortness of breath for 1 week. Rule out pulmonary embolus. EXAM: CT ANGIOGRAPHY CHEST WITH CONTRAST TECHNIQUE: Multidetector CT imaging of the chest was performed using the standard protocol during bolus administration of intravenous contrast. Multiplanar CT image reconstructions and MIPs were obtained to evaluate the vascular anatomy. RADIATION DOSE REDUCTION: This exam was performed according to the departmental dose-optimization program which includes automated exposure control, adjustment of the mA and/or kV according to patient size and/or use of iterative reconstruction technique. CONTRAST:  75mL OMNIPAQUE  IOHEXOL  350 MG/ML SOLN COMPARISON:  None Available. FINDINGS: Cardiovascular: Satisfactory opacification of the pulmonary arteries to the segmental level. No evidence of pulmonary embolism. Normal heart size. No pericardial effusion. Mediastinum/Nodes: No enlarged mediastinal, hilar, or axillary lymph nodes. Thyroid  gland, trachea, and esophagus demonstrate no significant findings. Lungs/Pleura: Bilateral increased thickening of the peribronchovascular interstitium. Signs of mucous plugging identified within the lower lobe airways bilaterally. No signs of pleural effusion, airspace consolidation, atelectasis or pneumothorax. No suspicious pulmonary nodule or mass identified bilaterally. Upper Abdomen: No acute abnormality. See report for abdomen and pelvis CT also performed today. Musculoskeletal: No chest wall abnormality. No acute or significant osseous findings. Review of the MIP images confirms the above findings. IMPRESSION: 1. No evidence for acute pulmonary embolus. 2. Bilateral increased thickening of the  peribronchovascular interstitium. Signs of mucous plugging identified within the lower lobe airways bilaterally. Findings are nonspecific but may reflect bronchitis. Electronically Signed   By: Waddell Calk HERO.D.  On: 11/22/2023 14:56   DG Chest 2 View Result Date: 11/22/2023 CLINICAL DATA:  53 year old male with history of shortness of breath. EXAM: CHEST - 2 VIEW COMPARISON:  Chest x-ray 03/08/2008. FINDINGS: Lung volumes are normal. No consolidative airspace disease. No pleural effusions. No pneumothorax. No pulmonary nodule or mass noted. Pulmonary vasculature and the cardiomediastinal silhouette are within normal limits. IMPRESSION: No radiographic evidence of acute cardiopulmonary disease. Electronically Signed   By: Toribio Aye M.D.   On: 11/22/2023 10:02    Microbiology: Results for orders placed or performed during the hospital encounter of 11/22/23  Aerobic/Anaerobic Culture w Gram Stain (surgical/deep wound)     Status: None (Preliminary result)   Collection Time: 11/25/23  2:40 PM   Specimen: Abscess  Result Value Ref Range Status   Specimen Description ABSCESS  Final   Special Requests GASTRIC WALL  Final   Gram Stain   Final    NO WBC SEEN RARE GRAM POSITIVE COCCI IN CHAINS RARE GRAM POSITIVE RODS RARE GRAM NEGATIVE RODS Performed at [facility], [address] [place], [place] [address]    Culture   Final    RARE STAPHYLOCOCCUS EPIDERMIDIS RARE STREPTOCOCCUS PARASANGUINIS FEW YEAST NO ANAEROBES ISOLATED; CULTURE IN PROGRESS FOR 5 DAYS    Report Status PENDING  Incomplete   Organism ID, Bacteria STAPHYLOCOCCUS EPIDERMIDIS  Final   Organism ID, Bacteria STREPTOCOCCUS PARASANGUINIS  Final      Susceptibility   Streptococcus parasanguinis - MIC*    PENICILLIN 1 INTERMEDIATE Intermediate     CEFTRIAXONE 1 SENSITIVE Sensitive     ERYTHROMYCIN 4 RESISTANT Resistant     LEVOFLOXACIN 1 SENSITIVE Sensitive     VANCOMYCIN  0.5 SENSITIVE Sensitive     * RARE  STREPTOCOCCUS PARASANGUINIS   Staphylococcus epidermidis - MIC*    CIPROFLOXACIN <=0.5 SENSITIVE Sensitive     ERYTHROMYCIN >=8 RESISTANT Resistant     GENTAMICIN <=0.5 SENSITIVE Sensitive     OXACILLIN >=4 RESISTANT Resistant     TETRACYCLINE  2 SENSITIVE Sensitive     VANCOMYCIN  2 SENSITIVE Sensitive     TRIMETH/SULFA <=10 SENSITIVE Sensitive     CLINDAMYCIN <=0.25 SENSITIVE Sensitive     RIFAMPIN <=0.5 SENSITIVE Sensitive     Inducible Clindamycin NEGATIVE Sensitive     * RARE STAPHYLOCOCCUS EPIDERMIDIS    Labs: CBC: Recent Labs  Lab 11/24/23 0434 11/25/23 0431 11/26/23 0350 11/28/23 0847 11/29/23 0802 11/30/23 0759  WBC 5.8 4.7 7.5 6.6 6.0 5.8  NEUTROABS 3.0  --   --   --   --   --   HGB 11.0* 10.9* 11.2* 10.4* 10.6* 10.1*  HCT 32.7* 31.8* 32.7* 30.1* 31.0* 30.1*  MCV 77.3* 77.2* 76.9* 77.2* 77.9* 78.0*  PLT 682* 646* 663* 560* 530* 520*   Basic Metabolic Panel: Recent Labs  Lab 11/24/23 0434 11/25/23 0431 11/26/23 0350 11/27/23 0403 11/28/23 0847 11/29/23 0802 11/30/23 0759  NA 142   < > 135 139 137 138 136  K 4.1   < > 3.5 4.1 3.7 3.8 3.5  CL 106   < > 103 105 104 105 102  CO2 25   < > 24 25 27 25 25   GLUCOSE 88   < > 94 86 105* 90 93  BUN 29*   < > 12 9 12 12 17   CREATININE 1.17   < > 0.95 1.00 0.93 0.82 1.10  CALCIUM  8.5*   < > 8.0* 8.2* 8.0* 8.4* 8.3*  MG 2.5*  --   --   --   --   --   --    < > =  values in this interval not displayed.   Liver Function Tests: Recent Labs  Lab 11/24/23 0434 11/25/23 0431 11/26/23 0350  AST 34 20 14*  ALT 73* 54* 42  ALKPHOS 116 99 89  BILITOT 0.5 0.3 0.3  PROT 6.5 6.1* 5.9*  ALBUMIN 2.1* 2.0* 2.2*   CBG: Recent Labs  Lab 11/23/23 1637  GLUCAP 94    Discharge time spent: greater than 30 minutes.  Signed: Owen DELENA Lore, MD [facility] 11/30/2023

## 2023-12-04 ENCOUNTER — Ambulatory Visit: Payer: Self-pay | Admitting: Nurse Practitioner

## 2023-12-04 ENCOUNTER — Ambulatory Visit: Payer: Self-pay | Admitting: Gastroenterology

## 2023-12-04 NOTE — Progress Notes (Deleted)
12/04/2023 Henry Kramer [id number] May 16, 1971   CHIEF COMPLAINT:   HISTORY OF PRESENT ILLNESS: Henry Kramer is a 53 year old male with a past medical history of polyp substance abuse, homelessness, chronic nausea, GERD and a gastric ulcer with perforation. He was admitted to the hospital 11/22/2023 - 11/30/2023. CTAP showed gastric wall thickening with gas and fluid collection which could reflect contained perforated gastric ulcer. He endorsed losing 40 lbs. He underwent an EGD 11/24/2023 which showed a large nonbleeding ulcer without perforation. Also had a large mass which was biopsied. Biopsy came back positive for H. pylori, stomach subepithelia mass: active gastritis,  negative fro metaplasia or dysplasia or carcinoma. Stomach Biopsy: focally active gastritis with lymphoid aggregates, foveolar  hyperplasia and extensive intestinal metaplasia. Prescribed Pylera for H. Pylori. Blood cultures grew staph epidermis and streptococcus viridans, treated with Vancomycin.    CT 7/25 showing contained perforation anterior to the proximal stomach, general surgery ER consulted. Patient underwent aspiration of 40 cc of fluid, he developed a small pneumothorax postprocedure which appeared to have resolved and x-ray the following morning.      Latest Ref Rng & Units 11/30/2023    7:59 AM 11/29/2023    8:02 AM 11/28/2023    8:47 AM  CBC  WBC 4.0 - 10.5 K/uL 5.8  6.0  6.6   Hemoglobin 13.0 - 17.0 g/dL 23.5  57.3  22.0   Hematocrit 39.0 - 52.0 % 30.1  31.0  30.1   Platelets 150 - 400 K/uL 520  530  560        Latest Ref Rng & Units 11/30/2023    7:59 AM 11/29/2023    8:02 AM 11/28/2023    8:47 AM  CMP  Glucose 70 - 99 mg/dL 93  90  254   BUN 6 - 20 mg/dL 17  12  12    Creatinine 0.61 - 1.24 mg/dL 2.70  6.23  7.62   Sodium 135 - 145 mmol/L 136  138  137   Potassium 3.5 - 5.1 mmol/L 3.5  3.8  3.7   Chloride 98 - 111 mmol/L 102  105  104   CO2 22 - 32 mmol/L 25  25  27    Calcium 8.9 - 10.3  mg/dL 8.3  8.4  8.0      EGD 11/24/2023 by Dr. Lavon Paganini: - Z-line regular, 40 cm from the incisors. - 2 cm hiatal hernia. - No gross lesions in the entire esophagus. - Non-obstructing non-bleeding gastric ulcer with no stigmata of bleeding. There is no evidence of perforation. Biopsied. - Rule out malignancy, gastric tumor in the gastric body and on the lesser curvature of the stomach. Biopsied. - Gastritis. Biopsied. - Normal examined duodenum. Impression: - Patient has a contact number available for emergencies. The signs and symptoms of potential delayed complications were discussed with the patient. Return to normal activities tomorrow. Written discharge instructions were provided to the patient. - Clear liquid diet - advance as tolerated to soft diet. - No aspirin, ibuprofen, naproxen, or other non-steroidal anti-inflammatory drugs. - Await pathology results. - Use sucralfate suspension 1 gram PO QID for 2 weeks. - Pantoprazole 40mg  IV BID - F/u with surgery for further management of fistula/submucosal lesion based on biopsy results  Past Medical History:  Diagnosis Date   AKI (acute kidney injury) (HCC) 05/22/2014   Chronic nausea    GERD (gastroesophageal reflux disease)    Homelessness    Marijuana use    Substance  abuse (HCC)    tobacco, marijuana, EtOH   Past Surgical History:  Procedure Laterality Date   BIOPSY  11/24/2023   Procedure: BIOPSY;  Surgeon: Napoleon Form, MD;  [facility];  Service: Gastroenterology;;   ESOPHAGOGASTRODUODENOSCOPY (EGD) WITH PROPOFOL N/A 11/24/2023   Procedure: ESOPHAGOGASTRODUODENOSCOPY (EGD) WITH PROPOFOL;  Surgeon: Napoleon Form, MD;  [facility];  Service: Gastroenterology;  Laterality: N/A;   NO PAST SURGERIES     Social History:  Family History:    reports that he has been smoking cigarettes. He has a 11 pack-year smoking history. He has never used smokeless tobacco. He reports current alcohol use. He reports current  drug use. Drug: Marijuana. family history includes Alcohol abuse in his father; Cancer in his mother and sister; Cirrhosis in his father; Diabetes in his brother; Obesity in his brother. No Known Allergies    Outpatient Encounter Medications as of 12/04/2023  Medication Sig   albuterol (VENTOLIN HFA) 108 (90 Base) MCG/ACT inhaler Inhale 1-2 puffs into the lungs every 6 (six) hours as needed for wheezing or shortness of breath.   bismuth subsalicylate (PEPTO BISMOL) 262 MG chewable tablet Chew 2 tablets (524 mg total) by mouth every 6 (six) hours.   cefadroxil (DURICEF) 500 MG capsule Take 2 capsules (1,000 mg total) by mouth 2 (two) times daily for 14 days.   metroNIDAZOLE (FLAGYL) 500 MG tablet Take 1 tablet (500 mg total) by mouth every 6 (six) hours for 12 days.   pantoprazole (PROTONIX) 40 MG tablet Take 1 tablet (40 mg total) by mouth 2 (two) times daily.   sucralfate (CARAFATE) 1 g tablet Take 1 tablet (1 g total) by mouth 4 (four) times daily -  with meals and at bedtime.   tetracycline (SUMYCIN) 500 MG capsule Take 1 capsule (500 mg total) by mouth every 6 (six) hours for 14 days.   No facility-administered encounter medications on file as of 12/04/2023.     REVIEW OF SYSTEMS:  Gen: Denies fever, sweats or chills. No weight loss.  CV: Denies chest pain, palpitations or edema. Resp: Denies cough, shortness of breath of hemoptysis.  GI: Denies heartburn, dysphagia, stomach or lower abdominal pain. No diarrhea or constipation.  GU: Denies urinary burning, blood in urine, increased urinary frequency or incontinence. MS: Denies joint pain, muscles aches or weakness. Derm: Denies rash, itchiness, skin lesions or unhealing ulcers. Psych: Denies depression, anxiety, memory loss or confusion. Heme: Denies bruising, easy bleeding. Neuro:  Denies headaches, dizziness or paresthesias. Endo:  Denies any problems with DM, thyroid or adrenal function.  PHYSICAL EXAM: There were no vitals  taken for this visit. General: in no acute distress. Head: Normocephalic and atraumatic. Eyes:  Sclerae non-icteric, conjunctive pink. Ears: Normal auditory acuity. Mouth: Dentition intact. No ulcers or lesions.  Neck: Supple, no lymphadenopathy or thyromegaly.  Lungs: Clear bilaterally to auscultation without wheezes, crackles or rhonchi. Heart: Regular rate and rhythm. No murmur, rub or gallop appreciated.  Abdomen: Soft, nontender, nondistended. No masses. No hepatosplenomegaly. Normoactive bowel sounds x 4 quadrants.  Rectal: Deferred.  Musculoskeletal: Symmetrical with no gross deformities. Skin: Warm and dry. No rash or lesions on visible extremities. Extremities: No edema. Neurological: Alert oriented x 4, no focal deficits.  Psychological:  Alert and cooperative. Normal mood and affect.  ASSESSMENT AND PLAN:    CC:  Placey, Chales Abrahams, NP

## 2023-12-09 ENCOUNTER — Encounter (INDEPENDENT_AMBULATORY_CARE_PROVIDER_SITE_OTHER): Payer: Self-pay | Admitting: Primary Care

## 2023-12-09 ENCOUNTER — Ambulatory Visit (INDEPENDENT_AMBULATORY_CARE_PROVIDER_SITE_OTHER): Payer: Self-pay | Admitting: Primary Care

## 2023-12-09 VITALS — BP 110/72 | HR 85 | Resp 16 | Ht 75.0 in | Wt 144.8 lb

## 2023-12-09 DIAGNOSIS — F1920 Other psychoactive substance dependence, uncomplicated: Secondary | ICD-10-CM

## 2023-12-09 DIAGNOSIS — Z5912 Inadequate housing utilities: Secondary | ICD-10-CM

## 2023-12-09 DIAGNOSIS — Z1211 Encounter for screening for malignant neoplasm of colon: Secondary | ICD-10-CM

## 2023-12-09 DIAGNOSIS — Z7689 Persons encountering health services in other specified circumstances: Secondary | ICD-10-CM

## 2023-12-09 DIAGNOSIS — Z09 Encounter for follow-up examination after completed treatment for conditions other than malignant neoplasm: Secondary | ICD-10-CM

## 2023-12-09 DIAGNOSIS — Z5941 Food insecurity: Secondary | ICD-10-CM

## 2023-12-09 NOTE — Progress Notes (Unsigned)
   Subjective:  [name] is a 53 y.o. male presents for hospital follow up and establish care. Presented to the ED with c/o with abdominal discomfort and nausea, pain in the mid abdomen that radiates around towards the left flank which has been associated with nausea and began a couple days before Christmas.  Admit date to the hospital was 11/22/23, patient was discharged from the hospital on 11/30/23, patient was admitted for: Gastric ulcer with perforation , Abdominal pain, chronic, epigastric, Loss of weight, Abnormal CT scan, gastrointestinal tract and Abnormal CT of the abdomen. Follow up with Arnaldo Natal, NP (Gastroenterology) on 12/04/2023; 930 am. please arrive 15 minutes prior to appt time. Unable to make appt due to transportation. Abdominal pain with urination flatus  and bowel movement.   Past Medical History:  Diagnosis Date   AKI (acute kidney injury) (HCC) 05/22/2014   Chronic nausea    GERD (gastroesophageal reflux disease)    Homelessness    Marijuana use    Substance abuse (HCC)    tobacco, marijuana, EtOH     No Known Allergies  Current Outpatient Medications on File Prior to Visit  Medication Sig Dispense Refill   albuterol (VENTOLIN HFA) 108 (90 Base) MCG/ACT inhaler Inhale 1-2 puffs into the lungs every 6 (six) hours as needed for wheezing or shortness of breath. 6.7 g 0   bismuth subsalicylate (PEPTO BISMOL) 262 MG chewable tablet Chew 2 tablets (524 mg total) by mouth every 6 (six) hours. 30 tablet 0   cefadroxil (DURICEF) 500 MG capsule Take 2 capsules (1,000 mg total) by mouth 2 (two) times daily for 14 days. 56 capsule 0   metroNIDAZOLE (FLAGYL) 500 MG tablet Take 1 tablet (500 mg total) by mouth every 6 (six) hours for 12 days. 48 tablet 0   pantoprazole (PROTONIX) 40 MG tablet Take 1 tablet (40 mg total) by mouth 2 (two) times daily. 30 tablet 1   sucralfate (CARAFATE) 1 g tablet Take 1 tablet (1 g total) by mouth 4 (four) times daily -  with  meals and at bedtime. 120 tablet 0   tetracycline (SUMYCIN) 500 MG capsule Take 1 capsule (500 mg total) by mouth every 6 (six) hours for 14 days. 56 capsule 0   No current facility-administered medications on file prior to visit.    Review of System: ROS Comprehensive ROS Pertinent positive and negative noted in HPI   Objective:  There were no vitals taken for this visit.  There were no vitals filed for this visit.  Physical Exam Vitals reviewed.  Neurological:     Mental Status: He is alert.      Assessment:  Azure was seen today for hospitalization follow-up.  Diagnoses and all orders for this visit:  Screening for colon cancer -     Fecal occult blood, imunochemical  Food insecurity -     AMB Referral VBCI Care Management  Inadequate housing utilities -     AMB Referral VBCI Care Management  Hospital discharge follow-up   Encounter to establish care     This note has been created with Dragon speech recognition software and Paediatric nurse. Any transcriptional errors are unintentional.   No follow-ups on file.  Grayce Sessions, NP 12/09/2023, 11:19 AM

## 2023-12-09 NOTE — Patient Instructions (Signed)
Community Resources  Advocacy/Legal Legal Aid [place]:  1-866-219-5262  /  336-272-0148  Family Justice Center:  336-641-7233  [facility] [place] 24-hr Crisis line:  336-273-7273  Women's Resource Center, [place]:  336-275-6090  Court Watch (custody):  336-275-2346  [place] [facility]:   336-279-9299    Baby & Breastfeeding Car Seat Inspection @ Various [place] Fire Depts.- call 336-373-2177  [place] Lactation  336-832-6860  [facility] [place] [facility] 336-878-6712  WIC: 336-641-3663 ([place]);  336-641-7571 ([place])  La Leche League:  1-877-452-5321   Childcare [place] [facility]: 336-369-5097 ([place]) / 336-887-8224 ([place])  - Child Care Resources/ Referrals/ Scholarships  - Head Start/ Early Head Start (call or apply online)  [place] DHHS: [place] Pre-K :  1-800-859-0829 / 336-274-5437   Employment / Job Search Women's Resource [facility] [place]: 336-275-6090 / [address]  [place] [employer] Center (JobLink): 336-373-5922 ([place]) / 336-882-4141 ([place])  [facility] [employer] Community Resource/ Career Center: 336-275-9801 / 336-282-7307  [place] [facility] Job & Career Center: 336-373-3764  DHHS Work First: 336-641-3447 ([place]) / 336-641-3447 ([place])  [facility] [place]:  336-676-5871   Financial Assistance [place] [facility]:  336-553-2657  [employer]: 336-235-0368  [place] (furniture):  336-370-4002  [place] Hel[facility] Hands: 336-373-4264  Low Income Energy Assistance  336-641-3000   Food Assistance DHHS- SNAP/ Food Stamps: 336-641-4588  WIC: [place]- 336-641-3663 ;  [place] 336-641-7571  Little Green Book- Free Meals  Little Blue Book- Free Food Pantries  During the summer, text "FOOD" to 877877   General Health / Clinics (Adults) Orange Card (for Adults) through [place] [facility]: (336) 895-4900  [facility]:   336-832-8035  [place] [facility]:   336-832-4444  Health Department:  336-641-3245  [place]  [place] [facility]:  336-415-3877 / 336-641-2100  Planned Parenthood of [place]:   336-373-0678  [facility]:   336-334-4822 x 50251   Housing [place] Housing Coalition:   336-691-9521  [place] Housing Authority:  336-275-8501  Affordable Housing Managemnt:  336-273-0568   Immigrant/ Refugee Center for [place]ians (UNCG):  336-256-1065  Faith Action [facility]:  336-379-0037  [employer] Institute:  336-937-4701  [employer] World Services:  336-617-0381  African Services Coalition:  336-574-2677   LGBTQ YouthSAFE  www.youthsafe[place].org  PFLAG  336-541-6754 / info@[place].org  The Trevor Project:  1-866-488-7386   Mental Health/ Substance Use [facility] [place]  336-387-6161  [place] [facility]:  336-832-9700 or 1-800-711-2635  [place]'s Circle of Care:  336-271-5888  [facility]:  336-294-1349  [place] Care [facility]:  336-542-2884  [facility] (walk-ins)  336-676-6840 / [address]  [place]:  800-449-1287  Alcoholics Anonymous:  336-854-4278  Narcotics Anonymous:  800-365-1036  Quit Smoking Hotline:  800-QUIT-NOW (800-784-8669)   Parenting Children's Home Society:  800-632-1400  [place]: Education Center & Support Groups:  336-832-6682  YWCA: 336-273-3461  UNCG: Bringing Out the Best:  336-334-3120               Thriving at Three (Hispanic families): 336-256-1066  Healthy Start ([facility] [place]):  336-387-6161 x2288  Parents as Teachers:  336-691-0024  [place] Child Development- Learning Together (Immigrants): 336-369-5001   Poison Control 800-222-1222  Sports & Recreation [employer] Open Doors Application: ymcanwnc.org/join/open-doors-financial-assistance/  City of [place] Recreation Centers: http://www.[place].gov/index.aspx?page=3615   Special Needs Family Support Network:  336-832-6507  Autism Society of [place]:   336-333-0197 x1402 or x1412 /  800-785-1035  TEACCH :  336-334-5773     ARC of [place]:  336-373-1076  Children's Developmental Service Agency (CDSA):  336-334-5601  CC4C (Care Coordination for Children):  336-641-7641   Transportation Medicaid Transportation: 336-641-4848 to apply  [place] Transit Authority: 336-335-6499 (reduced-fare bus ID to Medicaid/ Medicare/ Orange Card)  SCAT Paratransit services: Eligible riders only, call 336-333-6589 for application   Tutoring/Mentoring Black Child Development Institute: 336-230-2138  Big Brothers/ Big Sisters: 336-378-9100 ([place])  336-882-4167 ([place])  ACES through child's school: 336-370-2321  [employer] Achievers: contact your local Y  SHIELD Mentor Program: 336-337-2771   

## 2023-12-10 ENCOUNTER — Telehealth: Payer: Self-pay | Admitting: *Deleted

## 2023-12-10 NOTE — Progress Notes (Signed)
Complex Care Management Note Care Guide Note  12/10/2023 Name: Henry Kramer MRN: [id number] DOB: [birth year]   Complex Care Management Outreach Attempts: An unsuccessful telephone outreach was attempted today to offer the patient information about available complex care management services.  Follow Up Plan:  Additional outreach attempts will be made to offer the patient complex care management information and services.   Encounter Outcome:  No Answer  Gwenevere Ghazi  [facility]  [facility] Guide  Direct Dial: [phone]  Fax [phone]

## 2023-12-15 NOTE — Progress Notes (Signed)
Complex Care Management Note Care Guide Note  12/15/2023 Name: Henry Kramer MRN: [id number] DOB: 04/01/1971   Complex Care Management Outreach Attempts: A second unsuccessful outreach was attempted today to offer the patient with information about available complex care management services.  Follow Up Plan:  Additional outreach attempts will be made to offer the patient complex care management information and services.   Encounter Outcome:  No Answer patient does not have a phone. Contact for patient sent Care guides contact information to be called.   Gwenevere Ghazi  [facility]  [facility]-Based [facility], [facility] Guide  Direct Dial: [phone]  Fax [phone]

## 2023-12-18 NOTE — Progress Notes (Signed)
Complex Care Management Note Care Guide Note  12/18/2023 Name: Henry Kramer MRN: [id number] DOB: [birth year]   Complex Care Management Outreach Attempts: A third unsuccessful outreach was attempted today to offer the patient with information about available complex care management services.  Follow Up Plan:  No further outreach attempts will be made at this time. We have been unable to contact the patient to offer or enroll patient in complex care management services.  Encounter Outcome:  No Answer  Gwenevere Ghazi  [facility]  [facility] Guide  Direct Dial: [phone]  Fax [phone]

## 2024-01-27 ENCOUNTER — Ambulatory Visit (INDEPENDENT_AMBULATORY_CARE_PROVIDER_SITE_OTHER): Payer: Self-pay | Admitting: Gastroenterology

## 2024-01-27 ENCOUNTER — Encounter: Payer: Self-pay | Admitting: Gastroenterology

## 2024-01-27 VITALS — BP 118/62 | HR 71 | Ht 75.0 in | Wt 151.0 lb

## 2024-01-27 DIAGNOSIS — K253 Acute gastric ulcer without hemorrhage or perforation: Secondary | ICD-10-CM | POA: Diagnosis not present

## 2024-01-27 DIAGNOSIS — Z1211 Encounter for screening for malignant neoplasm of colon: Secondary | ICD-10-CM

## 2024-01-27 DIAGNOSIS — A048 Other specified bacterial intestinal infections: Secondary | ICD-10-CM | POA: Diagnosis not present

## 2024-01-27 MED ORDER — SUFLAVE 178.7 G PO SOLR: 1.0000 | Freq: Once | ORAL | 0 refills | Status: AC

## 2024-01-27 NOTE — Progress Notes (Signed)
 01/27/2024 Henry Kramer [id number] [birth year]   HISTORY OF PRESENT ILLNESS: This is a 53 year old male who was seen by our service/Dr. Lavon Paganini during hospitalization in January.  He had longstanding complaints of nausea with episodes of vomiting and upper abdominal pain.  CT scan showed irregular gastric wall thickening with a locally contained perforation.  No surgical management was needed.  Endoscopy plus performed as below and found to have a gastric ulcer, no perforation.  Found to have H. pylori on biopsies.  Was treated with tetracycline/Flagyl/bismuth/PPI.  Says that he took the full 14-day course of that.  Says that he is feeling much better.  Nausea and abdominal pain has about resolved at this point.  No new complaints.  Has never had a colonoscopy in the past.  EGD 11/2023:   - Z- line regular, 40 cm from the incisors. - 2 cm hiatal hernia. - No gross lesions in the entire esophagus. - Non- obstructing non- bleeding gastric ulcer with no stigmata of bleeding. There is no evidence of perforation. Biopsied. - Rule out malignancy, gastric tumor in the gastric body and on the lesser curvature of the stomach. Biopsied. - Gastritis. Biopsied. - Normal examined duodenum.   A. STOMACH, ANTRAL ULCER, BIOPSY:  Chronic active gastritis with lymphoid aggregates, reactive epithelial  changes and focal intestinal metaplasia  Helicobacter stain negative (IHC, adequate control)  Negative for dysplasia and carcinoma   B.  STOMACH, SUBEPITHELIAL MASS WITH FISTULA, BIOPSY:  Mild chronic, focally active gastritis with reactive epithelial changes  Helicobacter present (IHC, adequate control)  Negative for intestinal metaplasia, dysplasia and carcinoma   C. STOMACH, BIOPSY:  Chronic, focally active gastritis with lymphoid aggregates, foveolar  hyperplasia and extensive intestinal metaplasia  Helicobacter stain negative (IHC, adequate control)  Negative for dysplasia and carcinoma     Past Medical History:  Diagnosis Date   AKI (acute kidney injury) (HCC) 05/22/2014   Chronic nausea    GERD (gastroesophageal reflux disease)    Homelessness    Marijuana use    Substance abuse (HCC)    tobacco, marijuana, EtOH   Past Surgical History:  Procedure Laterality Date   BIOPSY  11/24/2023   Procedure: BIOPSY;  Surgeon: Napoleon Form, MD;  [facility];  Service: Gastroenterology;;   ESOPHAGOGASTRODUODENOSCOPY (EGD) WITH PROPOFOL N/A 11/24/2023   Procedure: ESOPHAGOGASTRODUODENOSCOPY (EGD) WITH PROPOFOL;  Surgeon: Napoleon Form, MD;  [facility];  Service: Gastroenterology;  Laterality: N/A;   NO PAST SURGERIES      reports that he has been smoking cigarettes. He has a 11 pack-year smoking history. He has never used smokeless tobacco. He reports current alcohol use. He reports current drug use. Drug: Marijuana. family history includes Alcohol abuse in his father; Cancer in his mother and sister; Cirrhosis in his father; Diabetes in his brother; Obesity in his brother. No Known Allergies    Outpatient Encounter Medications as of 01/27/2024  Medication Sig   albuterol (VENTOLIN HFA) 108 (90 Base) MCG/ACT inhaler Inhale 1-2 puffs into the lungs every 6 (six) hours as needed for wheezing or shortness of breath.   bismuth subsalicylate (PEPTO BISMOL) 262 MG chewable tablet Chew 2 tablets (524 mg total) by mouth every 6 (six) hours.   sucralfate (CARAFATE) 1 g tablet Take 1 tablet (1 g total) by mouth 4 (four) times daily -  with meals and at bedtime.   pantoprazole (PROTONIX) 40 MG tablet Take 1 tablet (40 mg total) by mouth 2 (two) times daily.  No facility-administered encounter medications on file as of 01/27/2024.    REVIEW OF SYSTEMS  : All other systems reviewed and negative except where noted in the History of Present Illness.   PHYSICAL EXAM: Ht 6\' 3"  (1.905 m)   Wt 151 lb (68.5 kg)   BMI 18.87 kg/m  General: Well developed male in  no acute distress Head: Normocephalic and atraumatic Eyes:  Sclerae anicteric, conjunctiva pink. Ears: Normal auditory acuity Lungs: Clear throughout to auscultation; no W/R/R. Heart: Regular rate and rhythm; no M/R/G. Abdomen: Soft, non-distended.  BS present.  Non-tender. Rectal:  Will be done at the time of colonoscopy. Musculoskeletal: Symmetrical with no gross deformities  Skin: No lesions on visible extremities Extremities: No edema  Neurological: Alert oriented x 4, grossly non-focal Psychological:  Alert and cooperative. Normal mood and affect  ASSESSMENT AND PLAN: *GU/Hpylori:  Had a long-standing history of nausea with episodes of vomiting and abdominal pain. Symptoms improved at this point.  Was treated with tetracycline/Flagyl/bismuth/PPI.  Will plan for repeat EGD with his colonoscopy below to confirm gastric ulcer is healed.  Will check a Diatherix for H. pylori.  Will have him continue his pantoprazole 40 mg twice daily for now.  If all looks well on endoscopy, likely decrease to once daily dosing. *CRC screening:  Never had a colonoscopy in the past.  Will schedule that with Dr. Lavon Paganini as well.  **The risks, benefits, and alternatives to EGD and colonoscopy were discussed with the patient and he consents to proceed.   CC:  Grayce Sessions, NP

## 2024-01-27 NOTE — Patient Instructions (Addendum)
 Continue Pantoprazole 40 mg twice daily  Your provider has ordered "Diatherix" stool testing for you. You have received a kit from our office today containing all necessary supplies to complete this test. Please carefully read the stool collection instructions provided in the kit before opening the accompanying materials. In addition, be sure there is a label providing your full name and date of birth on the "puritan opti-swab" tube that is supplied in the kit (if you do not see a label with this information on your test tube, please make [place] aware before test collection!). After completing the test, you should secure the purtian tube into the specimen biohazard bag. [facility] E-Req sheet (including date and time of specimen collection) should be placed into the outside pocket of the specimen biohazard bag and returned to the [place] lab (basement floor of [facility]) within 3 days of collection. Please make sure to give the specimen to a staff member at the lab. DO NOT leave the specimen on the counter.   If the specimen date and time (can be found in the upper right boxed portion of the sheet) are not filled out on the E-Req sheet, the test will NOT be performed.   _______________________________________________________  If your blood pressure at your visit was 140/90 or greater, please contact your primary care physician to follow up on this.  _______________________________________________________  If you are age 75 or older, your body mass index should be between 23-30. Your Body mass index is 18.87 kg/m. If this is out of the aforementioned range listed, please consider follow up with your Primary Care Provider.  If you are age 16 or younger, your body mass index should be between 19-25. Your Body mass index is 18.87 kg/m. If this is out of the aformentioned range listed, please consider follow up with your Primary Care Provider.    ________________________________________________________  The [place] [facility] providers would like to encourage you to use [facility] to communicate with providers for non-urgent requests or questions.  Due to long hold times on the telephone, sending your provider a message by [facility] may be a faster and more efficient way to get a response.  Please allow 48 business hours for a response.  Please remember that this is for non-urgent requests.  _______________________________________________________

## 2024-03-15 ENCOUNTER — Ambulatory Visit: Admitting: Gastroenterology

## 2024-03-15 ENCOUNTER — Encounter: Payer: Self-pay | Admitting: Gastroenterology

## 2024-03-15 VITALS — BP 130/85 | HR 74 | Temp 97.5°F | Resp 13 | Ht 75.0 in | Wt 151.0 lb

## 2024-03-15 DIAGNOSIS — A048 Other specified bacterial intestinal infections: Secondary | ICD-10-CM

## 2024-03-15 DIAGNOSIS — K253 Acute gastric ulcer without hemorrhage or perforation: Secondary | ICD-10-CM

## 2024-03-15 DIAGNOSIS — K2950 Unspecified chronic gastritis without bleeding: Secondary | ICD-10-CM

## 2024-03-15 DIAGNOSIS — Z1211 Encounter for screening for malignant neoplasm of colon: Secondary | ICD-10-CM

## 2024-03-15 MED ORDER — NA SULFATE-K SULFATE-MG SULF 17.5-3.13-1.6 GM/177ML PO SOLN: 1.0000 | Freq: Once | ORAL | 0 refills | Status: DC

## 2024-03-15 MED ORDER — SODIUM CHLORIDE 0.9 % IV SOLN: 500.0000 mL | Freq: Once | INTRAVENOUS | Status: DC

## 2024-03-15 NOTE — Progress Notes (Unsigned)
 Called to room to assist during endoscopic procedure.  Patient ID and intended procedure confirmed with present staff. Received instructions for my participation in the procedure from the performing physician.

## 2024-03-15 NOTE — Progress Notes (Unsigned)
 During admission interview it was discovered that pt was not adequately prepped for colonoscopy d/t falling asleep and not finishing the AM dose of prep. Pt rescheduled for 05/05/24 at 1:30p with a 2 day prep. Sent prescription for Suprep to pt's preferred pharmacy and new instructions printed and reviewed with pt.

## 2024-03-15 NOTE — Patient Instructions (Signed)

## 2024-03-15 NOTE — Progress Notes (Unsigned)
 Sedate, gd SR, tolerated procedure well, VSS, report to RN

## 2024-03-15 NOTE — Progress Notes (Unsigned)
 [facility] History and Physical   Primary Care Physician:  Marius Siemens, NP   Reason for Procedure:  History of gastric ulcer, H.pylori, colorectal cancer screening  Plan:    EGD and colonoscopy with possible interventions as needed     HPI: Henry Kramer is a very pleasant 53 y.o. male here for EGD and colonoscopy for history of gastric ulcer, H.pylori, colorectal cancer screening.   The risks and benefits as well as alternatives of endoscopic procedure(s) have been discussed and reviewed. All questions answered. The patient agrees to proceed.    Past Medical History:  Diagnosis Date   AKI (acute kidney injury) (HCC) 05/22/2014   Chronic nausea    GERD (gastroesophageal reflux disease)    Homelessness    Marijuana use    Substance abuse (HCC)    tobacco, marijuana, EtOH    Past Surgical History:  Procedure Laterality Date   BIOPSY  11/24/2023   Procedure: BIOPSY;  Surgeon: Sergio Dandy, MD;  Location: [facility];  Service: Gastroenterology;;   ESOPHAGOGASTRODUODENOSCOPY (EGD) WITH PROPOFOL  N/A 11/24/2023   Procedure: ESOPHAGOGASTRODUODENOSCOPY (EGD) WITH PROPOFOL ;  Surgeon: Sergio Dandy, MD;  Location: [facility];  Service: Gastroenterology;  Laterality: N/A;   NO PAST SURGERIES      Prior to Admission medications   Medication Sig Start Date End Date Taking? Authorizing Provider  albuterol  (VENTOLIN  HFA) 108 (90 Base) MCG/ACT inhaler Inhale 1-2 puffs into the lungs every 6 (six) hours as needed for wheezing or shortness of breath. 11/30/23   Regalado, Belkys A, MD  bismuth  subsalicylate (PEPTO BISMOL) 262 MG chewable tablet Chew 2 tablets (524 mg total) by mouth every 6 (six) hours. 11/30/23   Regalado, Belkys A, MD  pantoprazole  (PROTONIX ) 40 MG tablet Take 1 tablet (40 mg total) by mouth 2 (two) times daily. 11/30/23 12/30/23  Regalado, Belkys A, MD  sucralfate  (CARAFATE ) 1 g tablet Take 1 tablet (1 g total) by mouth 4 (four) times  daily -  with meals and at bedtime. 11/30/23   Regalado, Brigido Canales, MD    Current Outpatient Medications  Medication Sig Dispense Refill   albuterol  (VENTOLIN  HFA) 108 (90 Base) MCG/ACT inhaler Inhale 1-2 puffs into the lungs every 6 (six) hours as needed for wheezing or shortness of breath. 6.7 g 0   bismuth  subsalicylate (PEPTO BISMOL) 262 MG chewable tablet Chew 2 tablets (524 mg total) by mouth every 6 (six) hours. 30 tablet 0   pantoprazole  (PROTONIX ) 40 MG tablet Take 1 tablet (40 mg total) by mouth 2 (two) times daily. 30 tablet 1   sucralfate  (CARAFATE ) 1 g tablet Take 1 tablet (1 g total) by mouth 4 (four) times daily -  with meals and at bedtime. 120 tablet 0   Current Facility-Administered Medications  Medication Dose Route Frequency Provider Last Rate Last Admin   0.9 %  sodium chloride  infusion  500 mL Intravenous Once Chantal Worthey V, MD        Allergies as of 03/15/2024   (No Known Allergies)    Family History  Problem Relation Age of Onset   Cancer Mother    Alcohol abuse Father    Cirrhosis Father    Cancer Sister    Diabetes Brother    Obesity Brother    Liver disease Neg Hx    Esophageal cancer Neg Hx    Colon cancer Neg Hx     Social History   Socioeconomic History   Marital status: Single  Spouse name: Not on file   Number of children: 2   Years of education: Not on file   Highest education level: Not on file  Occupational History   Not on file  Tobacco Use   Smoking status: Every Day    Current packs/day: 0.50    Average packs/day: 0.5 packs/day for 22.0 years (11.0 ttl pk-yrs)    Types: Cigarettes   Smokeless tobacco: Never  Vaping Use   Vaping status: Never Used  Substance and Sexual Activity   Alcohol use: Yes    Comment: "occasional".     Drug use: Yes    Types: Marijuana    Comment: smokes THC 2-3 times per week.   Sexual activity: Not Currently  Other Topics Concern   Not on file  Social History Narrative   Not on file    Social Drivers of Health   Financial Resource Strain: Not on file  Food Insecurity: Food Insecurity Present (12/09/2023)   Hunger Vital Sign    Worried About Running Out of Food in the Last Year: Sometimes true    Ran Out of Food in the Last Year: Sometimes true  Transportation Needs: No Transportation Needs (12/09/2023)   PRAPARE - Administrator, Civil Service (Medical): No    Lack of Transportation (Non-Medical): No  Recent Concern: Transportation Needs - Unmet Transportation Needs (11/23/2023)   PRAPARE - Administrator, Civil Service (Medical): No    Lack of Transportation (Non-Medical): Yes  Physical Activity: Not on file  Stress: Not on file  Social Connections: Not on file  Intimate Partner Violence: Not At Risk (12/09/2023)   Humiliation, Afraid, Rape, and Kick questionnaire    Fear of Current or Ex-Partner: No    Emotionally Abused: No    Physically Abused: No    Sexually Abused: No    Review of Systems:  All other review of systems negative except as mentioned in the HPI.  Physical Exam: Vital signs in last 24 hours: BP 105/72   Pulse (!) 57   Temp (!) 97.5 F (36.4 C)   Ht 6\' 3"  (1.905 m)   Wt 151 lb (68.5 kg)   SpO2 100%   BMI 18.87 kg/m  General:   Alert, NAD Lungs:  Clear .   Heart:  Regular rate and rhythm Abdomen:  Soft, nontender and nondistended. Neuro/Psych:  Alert and cooperative. Normal mood and affect. A and O x 3  Reviewed labs, radiology imaging, old records and pertinent past GI work up  Patient is appropriate for planned procedure(s) and anesthesia in an ambulatory setting   K. Veena Jayziah Bankhead , MD [phone]   Addendum:   Patient is not clear, unable to complete bowel prep, will reschedule colonoscopy and only proceed with EGD

## 2024-03-15 NOTE — Op Note (Signed)
 [facility] Patient Name: Henry Kramer Procedure Date: 03/15/2024 9:16 AM MRN: [id number] Endoscopist: Sergio Dandy , MD, [id number] Age: 53 Referring MD:  Date of Birth: Feb 26, 1971 Gender: Male Account #: [id number] Procedure:                Upper GI endoscopy Indications:              Epigastric abdominal pain, Suspected                            gastro-esophageal reflux disease, Follow-up of                            acute gastric ulcer Medicines:                Monitored Anesthesia Care Procedure:                Pre-Anesthesia Assessment:                           - Prior to the procedure, a History and Physical                            was performed, and patient medications and                            allergies were reviewed. The patient's tolerance of                            previous anesthesia was also reviewed. The risks                            and benefits of the procedure and the sedation                            options and risks were discussed with the patient.                            All questions were answered, and informed consent                            was obtained. Prior Anticoagulants: The patient has                            taken no anticoagulant or antiplatelet agents. ASA                            Grade Assessment: II - A patient with mild systemic                            disease. After reviewing the risks and benefits,                            the patient was deemed in satisfactory condition to  undergo the procedure.                           After obtaining informed consent, the endoscope was                            passed under direct vision. Throughout the                            procedure, the patient's blood pressure, pulse, and                            oxygen saturations were monitored continuously. The                            GIF [id number] #[id number] was introduced through  the                            mouth, and advanced to the second part of duodenum.                            The upper GI endoscopy was accomplished without                            difficulty. The patient tolerated the procedure                            well. Scope In: Scope Out: Findings:                 The Z-line was regular and was found 40 cm from the                            incisors.                           No gross lesions were noted in the entire esophagus.                           Patchy mild inflammation characterized by                            congestion (edema), erosions, erythema and                            friability was found in the entire examined                            stomach. Biopsies were taken with a cold forceps                            for Helicobacter pylori testing.                           The cardia and gastric fundus were normal on  retroflexion.                           The examined duodenum was normal. Complications:            No immediate complications. Estimated Blood Loss:     Estimated blood loss was minimal. Impression:               - Z-line regular, 40 cm from the incisors.                           - No gross lesions in the entire esophagus.                           - Gastritis. Biopsied.                           - Normal examined duodenum. Recommendation:           - Patient has a contact number available for                            emergencies. The signs and symptoms of potential                            delayed complications were discussed with the                            patient. Return to normal activities tomorrow.                            Written discharge instructions were provided to the                            patient.                           - Resume previous diet.                           - Continue present medications.                           - Await pathology  results.                           - Reschedule colonoscopy, inadequate bowel prep Sergio Dandy, MD 03/15/2024 9:38:42 AM This report has been signed electronically.

## 2024-03-15 NOTE — Progress Notes (Unsigned)
 Pt's states no medical or surgical changes since previsit or office visit.

## 2024-03-15 NOTE — Progress Notes (Unsigned)
 Dr. Leonia Raman notified of patient's last BM being cloudy/brown with some sediment left (see flowsheet).  Patient agreed to proceed and do EGD procedure and reschedule colonoscopy due to bad prep.

## 2024-03-16 ENCOUNTER — Telehealth: Payer: Self-pay

## 2024-03-16 NOTE — Telephone Encounter (Signed)
  Follow up Call-     03/15/2024    9:05 AM  Call back number  Post procedure Call Back phone  # [phone]  Permission to leave phone message Yes     Called number on file and it went to a lady who said that he does not have a phone but she would message him through the internet and check on him. She said she would call us  back if there were any issues.

## 2024-03-17 LAB — SURGICAL PATHOLOGY

## 2024-04-25 ENCOUNTER — Ambulatory Visit: Payer: Self-pay | Admitting: Gastroenterology

## 2024-05-05 ENCOUNTER — Encounter: Admitting: Gastroenterology
